# Patient Record
Sex: Female | Born: 1950
Health system: Southern US, Community
[De-identification: ages and names within clinical notes are randomized; demographics above are authoritative.]

## PROBLEM LIST (undated history)

## (undated) DIAGNOSIS — M1990 Unspecified osteoarthritis, unspecified site: Secondary | ICD-10-CM

## (undated) DIAGNOSIS — R011 Cardiac murmur, unspecified: Secondary | ICD-10-CM

## (undated) DIAGNOSIS — T7840XA Allergy, unspecified, initial encounter: Secondary | ICD-10-CM

## (undated) DIAGNOSIS — I7300 Raynaud's syndrome without gangrene: Secondary | ICD-10-CM

## (undated) DIAGNOSIS — Z8489 Family history of other specified conditions: Secondary | ICD-10-CM

## (undated) DIAGNOSIS — R51 Headache: Secondary | ICD-10-CM

## (undated) DIAGNOSIS — K7689 Other specified diseases of liver: Secondary | ICD-10-CM

## (undated) DIAGNOSIS — I071 Rheumatic tricuspid insufficiency: Secondary | ICD-10-CM

## (undated) DIAGNOSIS — F1910 Other psychoactive substance abuse, uncomplicated: Secondary | ICD-10-CM

## (undated) DIAGNOSIS — R519 Headache, unspecified: Secondary | ICD-10-CM

## (undated) DIAGNOSIS — Z8709 Personal history of other diseases of the respiratory system: Secondary | ICD-10-CM

## (undated) DIAGNOSIS — D689 Coagulation defect, unspecified: Secondary | ICD-10-CM

## (undated) DIAGNOSIS — K589 Irritable bowel syndrome without diarrhea: Secondary | ICD-10-CM

## (undated) DIAGNOSIS — Z972 Presence of dental prosthetic device (complete) (partial): Secondary | ICD-10-CM

## (undated) DIAGNOSIS — K219 Gastro-esophageal reflux disease without esophagitis: Secondary | ICD-10-CM

## (undated) DIAGNOSIS — B019 Varicella without complication: Secondary | ICD-10-CM

## (undated) DIAGNOSIS — I5189 Other ill-defined heart diseases: Secondary | ICD-10-CM

## (undated) DIAGNOSIS — J301 Allergic rhinitis due to pollen: Secondary | ICD-10-CM

## (undated) DIAGNOSIS — E039 Hypothyroidism, unspecified: Secondary | ICD-10-CM

## (undated) DIAGNOSIS — I10 Essential (primary) hypertension: Secondary | ICD-10-CM

## (undated) DIAGNOSIS — I340 Nonrheumatic mitral (valve) insufficiency: Secondary | ICD-10-CM

## (undated) DIAGNOSIS — M539 Dorsopathy, unspecified: Secondary | ICD-10-CM

## (undated) DIAGNOSIS — E119 Type 2 diabetes mellitus without complications: Secondary | ICD-10-CM

## (undated) DIAGNOSIS — D649 Anemia, unspecified: Secondary | ICD-10-CM

## (undated) HISTORY — PX: CERVICAL CERCLAGE: SHX1329

## (undated) HISTORY — PX: DIAGNOSTIC LAPAROSCOPY: SUR761

## (undated) HISTORY — DX: Headache, unspecified: R51.9

## (undated) HISTORY — DX: Coagulation defect, unspecified: D68.9

## (undated) HISTORY — DX: Cardiac murmur, unspecified: R01.1

## (undated) HISTORY — DX: Headache: R51

## (undated) HISTORY — DX: Unspecified osteoarthritis, unspecified site: M19.90

## (undated) HISTORY — DX: Essential (primary) hypertension: I10

## (undated) HISTORY — DX: Varicella without complication: B01.9

## (undated) HISTORY — DX: Allergy, unspecified, initial encounter: T78.40XA

## (undated) HISTORY — DX: Gastro-esophageal reflux disease without esophagitis: K21.9

## (undated) HISTORY — DX: Personal history of other diseases of the respiratory system: Z87.09

## (undated) HISTORY — DX: Other psychoactive substance abuse, uncomplicated: F19.10

## (undated) HISTORY — DX: Allergic rhinitis due to pollen: J30.1

## (undated) HISTORY — DX: Type 2 diabetes mellitus without complications: E11.9

## (undated) HISTORY — DX: Hypothyroidism, unspecified: E03.9

---
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
## 1972-07-28 HISTORY — PX: APPENDECTOMY: SHX54

## 2015-02-13 ENCOUNTER — Encounter: Payer: Self-pay | Admitting: Internal Medicine

## 2016-04-10 ENCOUNTER — Ambulatory Visit
Admission: RE | Admit: 2016-04-10 | Discharge: 2016-04-10 | Disposition: A | Discharge status: Home / Self Care | Payer: BLUE CROSS/BLUE SHIELD | Source: Ambulatory Visit | Attending: Orthopedic Surgery | Admitting: Orthopedic Surgery

## 2016-04-10 ENCOUNTER — Other Ambulatory Visit: Payer: Self-pay | Admitting: Orthopedic Surgery

## 2016-04-10 DIAGNOSIS — M47812 Spondylosis without myelopathy or radiculopathy, cervical region: Secondary | ICD-10-CM | POA: Insufficient documentation

## 2016-04-10 DIAGNOSIS — M4040 Postural lordosis, site unspecified: Secondary | ICD-10-CM | POA: Diagnosis not present

## 2016-04-10 DIAGNOSIS — R52 Pain, unspecified: Secondary | ICD-10-CM

## 2016-04-10 DIAGNOSIS — M542 Cervicalgia: Secondary | ICD-10-CM | POA: Diagnosis present

## 2016-05-28 LAB — HM DIABETES EYE EXAM

## 2016-08-07 DIAGNOSIS — E1165 Type 2 diabetes mellitus with hyperglycemia: Secondary | ICD-10-CM | POA: Diagnosis not present

## 2016-08-21 ENCOUNTER — Other Ambulatory Visit: Payer: Self-pay | Admitting: Orthopedic Surgery

## 2016-08-21 DIAGNOSIS — M542 Cervicalgia: Secondary | ICD-10-CM

## 2016-08-21 DIAGNOSIS — M5412 Radiculopathy, cervical region: Secondary | ICD-10-CM

## 2016-08-29 ENCOUNTER — Encounter: Payer: Self-pay | Admitting: Radiology

## 2016-08-29 ENCOUNTER — Ambulatory Visit
Admission: RE | Admit: 2016-08-29 | Discharge: 2016-08-29 | Disposition: A | Discharge status: Home / Self Care | Payer: Medicare Other | Source: Ambulatory Visit | Attending: Orthopedic Surgery | Admitting: Orthopedic Surgery

## 2016-08-29 DIAGNOSIS — M542 Cervicalgia: Secondary | ICD-10-CM

## 2016-08-29 DIAGNOSIS — M2548 Effusion, other site: Secondary | ICD-10-CM | POA: Insufficient documentation

## 2016-08-29 DIAGNOSIS — M5412 Radiculopathy, cervical region: Secondary | ICD-10-CM

## 2016-08-29 DIAGNOSIS — R609 Edema, unspecified: Secondary | ICD-10-CM | POA: Diagnosis not present

## 2016-08-29 DIAGNOSIS — M47892 Other spondylosis, cervical region: Secondary | ICD-10-CM | POA: Diagnosis not present

## 2016-08-29 DIAGNOSIS — M50321 Other cervical disc degeneration at C4-C5 level: Secondary | ICD-10-CM | POA: Insufficient documentation

## 2016-08-29 DIAGNOSIS — M47812 Spondylosis without myelopathy or radiculopathy, cervical region: Secondary | ICD-10-CM | POA: Diagnosis not present

## 2016-09-09 DIAGNOSIS — M50121 Cervical disc disorder at C4-C5 level with radiculopathy: Secondary | ICD-10-CM | POA: Diagnosis not present

## 2016-09-09 DIAGNOSIS — M50122 Cervical disc disorder at C5-C6 level with radiculopathy: Secondary | ICD-10-CM | POA: Diagnosis not present

## 2016-09-09 DIAGNOSIS — M50222 Other cervical disc displacement at C5-C6 level: Secondary | ICD-10-CM | POA: Diagnosis not present

## 2016-09-09 DIAGNOSIS — M50221 Other cervical disc displacement at C4-C5 level: Secondary | ICD-10-CM | POA: Diagnosis not present

## 2016-09-15 DIAGNOSIS — M50121 Cervical disc disorder at C4-C5 level with radiculopathy: Secondary | ICD-10-CM | POA: Diagnosis not present

## 2016-09-15 DIAGNOSIS — M50221 Other cervical disc displacement at C4-C5 level: Secondary | ICD-10-CM | POA: Diagnosis not present

## 2016-09-15 DIAGNOSIS — M50122 Cervical disc disorder at C5-C6 level with radiculopathy: Secondary | ICD-10-CM | POA: Diagnosis not present

## 2016-09-15 DIAGNOSIS — M50222 Other cervical disc displacement at C5-C6 level: Secondary | ICD-10-CM | POA: Diagnosis not present

## 2016-09-17 DIAGNOSIS — M50122 Cervical disc disorder at C5-C6 level with radiculopathy: Secondary | ICD-10-CM | POA: Diagnosis not present

## 2016-09-17 DIAGNOSIS — M50222 Other cervical disc displacement at C5-C6 level: Secondary | ICD-10-CM | POA: Diagnosis not present

## 2016-09-17 DIAGNOSIS — M50121 Cervical disc disorder at C4-C5 level with radiculopathy: Secondary | ICD-10-CM | POA: Diagnosis not present

## 2016-09-17 DIAGNOSIS — M50221 Other cervical disc displacement at C4-C5 level: Secondary | ICD-10-CM | POA: Diagnosis not present

## 2016-09-23 DIAGNOSIS — M50221 Other cervical disc displacement at C4-C5 level: Secondary | ICD-10-CM | POA: Diagnosis not present

## 2016-09-23 DIAGNOSIS — M50122 Cervical disc disorder at C5-C6 level with radiculopathy: Secondary | ICD-10-CM | POA: Diagnosis not present

## 2016-09-23 DIAGNOSIS — M50121 Cervical disc disorder at C4-C5 level with radiculopathy: Secondary | ICD-10-CM | POA: Diagnosis not present

## 2016-09-23 DIAGNOSIS — M50222 Other cervical disc displacement at C5-C6 level: Secondary | ICD-10-CM | POA: Diagnosis not present

## 2016-10-06 ENCOUNTER — Ambulatory Visit: Admitting: Family

## 2016-11-06 ENCOUNTER — Encounter: Payer: Self-pay | Admitting: Internal Medicine

## 2016-11-06 ENCOUNTER — Ambulatory Visit (INDEPENDENT_AMBULATORY_CARE_PROVIDER_SITE_OTHER): Payer: Medicare Other | Admitting: Internal Medicine

## 2016-11-06 VITALS — BP 122/74 | HR 75 | Temp 97.5°F | Resp 12 | Ht 63.78 in | Wt 121.4 lb

## 2016-11-06 DIAGNOSIS — Z1239 Encounter for other screening for malignant neoplasm of breast: Secondary | ICD-10-CM

## 2016-11-06 DIAGNOSIS — E039 Hypothyroidism, unspecified: Secondary | ICD-10-CM | POA: Diagnosis not present

## 2016-11-06 DIAGNOSIS — I10 Essential (primary) hypertension: Secondary | ICD-10-CM | POA: Diagnosis not present

## 2016-11-06 DIAGNOSIS — E1165 Type 2 diabetes mellitus with hyperglycemia: Secondary | ICD-10-CM | POA: Insufficient documentation

## 2016-11-06 DIAGNOSIS — Z1231 Encounter for screening mammogram for malignant neoplasm of breast: Secondary | ICD-10-CM

## 2016-11-06 DIAGNOSIS — Z1211 Encounter for screening for malignant neoplasm of colon: Secondary | ICD-10-CM

## 2016-11-06 DIAGNOSIS — E119 Type 2 diabetes mellitus without complications: Secondary | ICD-10-CM

## 2016-11-06 DIAGNOSIS — M5442 Lumbago with sciatica, left side: Secondary | ICD-10-CM | POA: Diagnosis not present

## 2016-11-06 LAB — CBC WITH DIFFERENTIAL/PLATELET
Basophils Absolute: 0.1 10*3/uL (ref 0.0–0.1)
Basophils Relative: 1 % (ref 0.0–3.0)
Eosinophils Absolute: 0.1 10*3/uL (ref 0.0–0.7)
Eosinophils Relative: 2.3 % (ref 0.0–5.0)
HCT: 39.3 % (ref 36.0–46.0)
Hemoglobin: 13.2 g/dL (ref 12.0–15.0)
Lymphocytes Relative: 28.4 % (ref 12.0–46.0)
Lymphs Abs: 1.5 10*3/uL (ref 0.7–4.0)
MCHC: 33.7 g/dL (ref 30.0–36.0)
MCV: 90.2 fl (ref 78.0–100.0)
Monocytes Absolute: 0.6 10*3/uL (ref 0.1–1.0)
Monocytes Relative: 11 % (ref 3.0–12.0)
Neutro Abs: 3 10*3/uL (ref 1.4–7.7)
Neutrophils Relative %: 57.3 % (ref 43.0–77.0)
Platelets: 252 10*3/uL (ref 150.0–400.0)
RBC: 4.36 Mil/uL (ref 3.87–5.11)
RDW: 13 % (ref 11.5–15.5)
WBC: 5.3 10*3/uL (ref 4.0–10.5)

## 2016-11-06 LAB — HEMOGLOBIN A1C: Hgb A1c MFr Bld: 6.5 % (ref 4.6–6.5)

## 2016-11-06 LAB — LIPID PANEL
Cholesterol: 130 mg/dL (ref 0–200)
HDL: 81.6 mg/dL (ref >39.00)
LDL Cholesterol: 34 mg/dL (ref 0–99)
NonHDL: 48.32
Total CHOL/HDL Ratio: 2
Triglycerides: 71 mg/dL (ref 0.0–149.0)
VLDL: 14.2 mg/dL (ref 0.0–40.0)

## 2016-11-06 LAB — COMPREHENSIVE METABOLIC PANEL
ALT: 16 U/L (ref 0–35)
AST: 24 U/L (ref 0–37)
Albumin: 4.9 g/dL (ref 3.5–5.2)
Alkaline Phosphatase: 50 U/L (ref 39–117)
BUN: 9 mg/dL (ref 6–23)
CO2: 28 mEq/L (ref 19–32)
Calcium: 10.1 mg/dL (ref 8.4–10.5)
Chloride: 101 mEq/L (ref 96–112)
Creatinine, Ser: 0.91 mg/dL (ref 0.40–1.20)
GFR: 65.87 mL/min (ref >60.00)
Glucose, Bld: 113 mg/dL — ABNORMAL HIGH (ref 70–99)
Potassium: 5.1 mEq/L (ref 3.5–5.1)
Sodium: 137 mEq/L (ref 135–145)
Total Bilirubin: 0.7 mg/dL (ref 0.2–1.2)
Total Protein: 7.1 g/dL (ref 6.0–8.3)

## 2016-11-06 LAB — TSH: TSH: 1.03 u[IU]/mL (ref 0.35–4.50)

## 2016-11-06 LAB — GLUCOSE, POCT (MANUAL RESULT ENTRY): POC Glucose: 80 mg/dl (ref 70–99)

## 2016-11-06 NOTE — Progress Notes (Signed)
Patient ID: Donna Howell, female   DOB: [birth year], 66 y.o.   MRN: [id number]   Subjective:    Patient ID: Donna Howell, female    DOB: Sep 02, 1950, 66 y.o.   MRN: [id number]  HPI  Patient here to establish care. Moved here from [place] 9 months ago.  Has a known history of diabetes.  Has been under good control.  Recent a1c 6.2.  Watches her diet.  Stays active.  No chest pain.  No sob.  No acid reflux problems reported.  Breathing stable.  Blood pressure has been doing well.  No abdominal pain.  Bowels moving.  Discussed her history.  Discussed screening colonoscopy.  She declines.  Discussed cologuard.  She does report some left hip pain and left leg pain.  Worse when gets out of bed.  Better when starts moving.  Has tried physical therapy, TENS andis on voltaren gel now.  Desires no further intervention at this time.  She had stress test previousy.  States blood pressure increased during stress test.  Was placed on inderal.  Did not tolerate.  Off medication since.  Has a history of migraines.  Headaches better.     Past Medical History:  Diagnosis Date  . Arthritis   . Chicken pox   . Diabetes ([place])   . Frequent headaches   . GERD (gastroesophageal reflux disease)   . Hay fever   . Heart murmur   . High blood pressure   . History of bronchitis   . Hypothyroidism    Past Surgical History:  Procedure Laterality Date  . APPENDECTOMY  1974  . TONSILLECTOMY AND ADENOIDECTOMY  1962   Family History  Problem Relation Age of Onset  . Arthritis Mother   . Heart disease Mother   . Diabetes Mother   . Stroke Father   . Arthritis Sister   . Alcoholism Brother   . Arthritis Maternal Grandmother   . Heart disease Maternal Grandmother   . Diabetes Maternal Grandmother    Social History   Social History  . Marital status: Married    Spouse name: N/A  . Number of children: N/A  . Years of education: N/A   Social History Main Topics  . Smoking status: Never Smoker  .  Smokeless tobacco: Never Used  . Alcohol use No  . Drug use: No  . Sexual activity: Not Asked   Other Topics Concern  . None   Social History Narrative  . None    Outpatient Encounter Prescriptions as of 11/06/2016  Medication Sig  . diclofenac sodium (VOLTAREN) 1 % GEL   . eszopiclone (LUNESTA) 2 MG TABS tablet Take 1 tablet by mouth at bedtime.  Marland Kitchen levothyroxine (SYNTHROID, LEVOTHROID) 50 MCG tablet Take 1 tablet by mouth daily.  Marland Kitchen lisinopril (PRINIVIL,ZESTRIL) 5 MG tablet Take 1 tablet by mouth daily.  . metFORMIN (GLUCOPHAGE-XR) 500 MG 24 hr tablet Take 1 tablet by mouth 2 (two) times daily.  . metoprolol succinate (TOPROL-XL) 25 MG 24 hr tablet Take 1 tablet by mouth daily. 1 tab in the morning and 1/2 at bedtime  . ONE TOUCH ULTRA TEST test strip   . rosuvastatin (CRESTOR) 20 MG tablet Take 1 tablet by mouth daily.   No facility-administered encounter medications on file as of 11/06/2016.     Review of Systems  Constitutional: Negative for appetite change and unexpected weight change.  HENT: Negative for congestion and sinus pressure.   Respiratory: Negative for cough, chest tightness and shortness of breath.  Cardiovascular: Negative for chest pain, palpitations and leg swelling.  Gastrointestinal: Negative for abdominal pain, diarrhea, nausea and vomiting.  Genitourinary: Negative for difficulty urinating and dysuria.  Musculoskeletal: Positive for back pain. Negative for joint swelling.  Skin: Negative for color change and rash.  Neurological: Negative for dizziness, light-headedness and headaches.  Psychiatric/Behavioral: Negative for agitation and dysphoric mood.       Objective:    Physical Exam  Constitutional: She appears well-developed and well-nourished. No distress.  HENT:  Nose: Nose normal.  Mouth/Throat: Oropharynx is clear and moist.  Neck: Neck supple. No thyromegaly present.  Cardiovascular: Normal rate and regular rhythm.   Pulmonary/Chest:  Breath sounds normal. No respiratory distress. She has no wheezes.  Abdominal: Soft. Bowel sounds are normal. There is no tenderness.  Musculoskeletal: She exhibits no edema or tenderness.  Lymphadenopathy:    She has no cervical adenopathy.  Skin: No rash noted. No erythema.  Psychiatric: She has a normal mood and affect. Her behavior is normal.    BP 122/74 (BP Location: Left Arm, Patient Position: Sitting, Cuff Size: Normal)   Pulse 75   Temp 97.5 F (36.4 C) (Oral)   Resp 12   Ht 5' 3.78" (1.62 m)   Wt 121 lb 6.4 oz (55.1 kg)   SpO2 96%   BMI 20.98 kg/m  Wt Readings from Last 3 Encounters:  11/06/16 121 lb 6.4 oz (55.1 kg)     Mr Cervical Spine Wo Contrast  Result Date: 08/29/2016 CLINICAL DATA:  Lifting injury 6 months ago. Headaches. Bilateral shoulder pain in upper arm pain. Bilateral hand numbness. EXAM: MRI CERVICAL SPINE WITHOUT CONTRAST TECHNIQUE: Multiplanar, multisequence MR imaging of the cervical spine was performed. No intravenous contrast was administered. COMPARISON:  04/10/2016 radiographs FINDINGS: Alignment: 2 mm degenerative anterolisthesis at C4-5. Vertebrae: Type 2 degenerative endplate findings at E1-7 with associated spurring, disc desiccation, and mild loss of disc height. Small chronic Schmorl's node along the superior endplate of C6. Left facet effusion with considerable facet marrow edema on the left at the C4-5 level. I do not perceive a fracture in this vicinity. Left eccentric type 1 degenerative endplate findings at E0-8. There is considerable abnormal edema in the spinous process of C7 as on image 8/4. There is a small amount of fluid separating the tip of the spinous process from the thickened nuchal ligament in this vicinity. Surrounding soft tissue edema tracking along the margins of the adjacent muscles inserting along the tip of the spinous process, including the longissimus capitis, semispinalis capitis, splenius capitis, and rhomboid major  bilaterally. Looking back at the conventional radiographs of 04/10/2016, I do not see an obvious bony fragment in this vicinity. Cord: No significant abnormal spinal cord signal is observed. Posterior Fossa, vertebral arteries, paraspinal tissues: Edema along the soft tissues near the C7 spinous process tip as detailed above. Disc levels: C2-3:  Unremarkable. C3-4: No impingement. Bilateral facet arthropathy and mild left uncinate spurring. C4-5: Mild left foraminal stenosis due to uncinate and facet spurring. Left facet joint effusion with marrow edema in the facets. Diffuse disc bulge. C5-6: Mild left foraminal stenosis due to uncinate and facet spurring. Mild disc bulge. C6-7:  No impingement.  Small central disc protrusion. C7-T1:  No impingement.  Right greater than left facet spurring. IMPRESSION: 1. Suspected avulsion of the nuchal ligament from the C7 spinous process tip, with marrow edema in the C7 spinous process, and a small amount of fluid interposed between the spinous process tip and the thickened  nuchal ligament. Low-level edema tracks along the adjacent muscular attachments to the C7 spinous process as detailed above. 2. Cervical spondylosis and degenerative disc disease causing mild impingement at C4-5 and C5-6. 3. Notable left facet effusion and facet marrow edema at the C4-5 level. Electronically Signed   By: Van Clines M.D.   On: 08/29/2016 09:16       Assessment & Plan:   Problem List Items Addressed This Visit    Back pain    Back and leg pain as outlined.  Had MRI.  Has tried therapy, TENS unit and traction.  Using voltaren gel now.  Does help some. Desires no further intervention at this time.  Follow.        Colon cancer screening    Declines colonoscopy.  Discussed cologuard.  Has kit from her previous MD.  See if can change to me as ordering physician.        Diabetes mellitus without complication ([facility])    Diet and exercise.  Last a1c 6.2.  Follow met b and a1c.   Keep up to date with eye checks.        Relevant Medications   lisinopril (PRINIVIL,ZESTRIL) 5 MG tablet   rosuvastatin (CRESTOR) 20 MG tablet   metFORMIN (GLUCOPHAGE-XR) 500 MG 24 hr tablet   Other Relevant Orders   Lipid panel (Completed)   Hemoglobin A1c (Completed)   POCT Glucose (CBG) (Completed)   Hypertension, essential    Blood pressure under good control.  Continue same medication regimen.  Follow pressures.  Follow metabolic panel.        Relevant Medications   lisinopril (PRINIVIL,ZESTRIL) 5 MG tablet   rosuvastatin (CRESTOR) 20 MG tablet   metoprolol succinate (TOPROL-XL) 25 MG 24 hr tablet   Other Relevant Orders   CBC with Differential/Platelet (Completed)   Comprehensive metabolic panel (Completed)   Hypothyroidism    On thyroid replacement.  Follow tsh.        Relevant Medications   levothyroxine (SYNTHROID, LEVOTHROID) 50 MCG tablet   metoprolol succinate (TOPROL-XL) 25 MG 24 hr tablet   Other Relevant Orders   TSH (Completed)    Other Visit Diagnoses    Screening for breast cancer    -  Primary   Relevant Orders   MM DIGITAL SCREENING BILATERAL       Einar Pheasant, MD

## 2016-11-06 NOTE — Progress Notes (Signed)
Pre-visit discussion using our clinic review tool. No additional management support is needed unless otherwise documented below in the visit note.  

## 2016-11-07 ENCOUNTER — Encounter: Payer: Self-pay | Admitting: Internal Medicine

## 2016-11-09 ENCOUNTER — Encounter: Payer: Self-pay | Admitting: Internal Medicine

## 2016-11-09 DIAGNOSIS — Z1211 Encounter for screening for malignant neoplasm of colon: Secondary | ICD-10-CM | POA: Insufficient documentation

## 2016-11-09 DIAGNOSIS — M549 Dorsalgia, unspecified: Secondary | ICD-10-CM | POA: Insufficient documentation

## 2016-11-09 NOTE — Assessment & Plan Note (Signed)
Diet and exercise.  Last a1c 6.2.  Follow met b and a1c.  Keep up to date with eye checks.

## 2016-11-09 NOTE — Assessment & Plan Note (Signed)
On thyroid replacement.  Follow tsh.  

## 2016-11-09 NOTE — Assessment & Plan Note (Signed)
Back and leg pain as outlined.  Had MRI.  Has tried therapy, TENS unit and traction.  Using voltaren gel now.  Does help some. Desires no further intervention at this time.  Follow.

## 2016-11-09 NOTE — Assessment & Plan Note (Signed)
Blood pressure under good control.  Continue same medication regimen.  Follow pressures.  Follow metabolic panel.   

## 2016-11-09 NOTE — Assessment & Plan Note (Signed)
Declines colonoscopy.  Discussed cologuard.  Has kit from her previous MD.  See if can change to me as ordering physician.

## 2016-12-05 ENCOUNTER — Ambulatory Visit
Admission: RE | Admit: 2016-12-05 | Discharge: 2016-12-05 | Disposition: A | Discharge status: Home / Self Care | Payer: Medicare Other | Source: Ambulatory Visit | Attending: Internal Medicine | Admitting: Internal Medicine

## 2016-12-05 DIAGNOSIS — Z1231 Encounter for screening mammogram for malignant neoplasm of breast: Secondary | ICD-10-CM | POA: Insufficient documentation

## 2016-12-05 DIAGNOSIS — Z1239 Encounter for other screening for malignant neoplasm of breast: Secondary | ICD-10-CM

## 2016-12-05 LAB — HM MAMMOGRAPHY

## 2016-12-06 ENCOUNTER — Encounter: Payer: Self-pay | Admitting: Internal Medicine

## 2016-12-06 DIAGNOSIS — Z0000 Encounter for general adult medical examination without abnormal findings: Secondary | ICD-10-CM | POA: Insufficient documentation

## 2017-02-02 ENCOUNTER — Other Ambulatory Visit: Payer: Self-pay | Admitting: Internal Medicine

## 2017-02-02 NOTE — Telephone Encounter (Signed)
I have written to ok the prescriptions, but need to clarify how she is taking the metoprolol.  Directions say 1 q day and 1 1/2 q day.  Just need to correct and then ok rx's as written.

## 2017-02-02 NOTE — Telephone Encounter (Signed)
Pt led requesting a refill on her levothyroxine (SYNTHROID, LEVOTHROID) 50 MCG tablet, metoprolol succinate (TOPROL-XL) 25 MG 24 hr tablet, rosuvastatin (CRESTOR) 20 MG tablet, eszopiclone (LUNESTA) 2 MG TABS tablet, and metFORMIN (GLUCOPHAGE-XR) 500 MG 24 hr tablet. Please advise, thank you!  [facility] [address] [place] - [id number]

## 2017-02-02 NOTE — Telephone Encounter (Signed)
You have never given any of the scripts. Last o/v with you on 11/06/16 with f/u on 02/25/17. She had all labs on 11/06/16. Can we refill?

## 2017-02-05 NOTE — Telephone Encounter (Signed)
Please call pt at [phone]

## 2017-02-06 ENCOUNTER — Telehealth: Payer: Self-pay

## 2017-02-06 ENCOUNTER — Other Ambulatory Visit: Payer: Self-pay

## 2017-02-06 MED ORDER — LEVOTHYROXINE SODIUM 50 MCG PO TABS: 50.0000 ug | ORAL_TABLET | Freq: Every day | ORAL | 2 refills | Status: DC

## 2017-02-06 MED ORDER — METOPROLOL SUCCINATE ER 25 MG PO TB24: ORAL_TABLET | ORAL | 2 refills | Status: DC

## 2017-02-06 MED ORDER — METFORMIN HCL ER 500 MG PO TB24: 500.0000 mg | ORAL_TABLET | Freq: Two times a day (BID) | ORAL | 2 refills | Status: DC

## 2017-02-06 MED ORDER — ROSUVASTATIN CALCIUM 20 MG PO TABS: 20.0000 mg | ORAL_TABLET | Freq: Every day | ORAL | 2 refills | Status: DC

## 2017-02-06 MED ORDER — ESZOPICLONE 2 MG PO TABS: 2.0000 mg | ORAL_TABLET | Freq: Every day | ORAL | 1 refills | Status: DC

## 2017-02-06 NOTE — Telephone Encounter (Signed)
lunesta faxed pt informed

## 2017-02-06 NOTE — Telephone Encounter (Signed)
ok'd rx for her prescriptions.

## 2017-02-06 NOTE — Telephone Encounter (Signed)
Patient states she is taking 1 tab in am and 1/2 at night.

## 2017-02-06 NOTE — Telephone Encounter (Signed)
Patient called states that she needs refills on following medications she has never received from you she is a new patient. She will run out before f/u with you Lunesta Synthroid  Metformin Metoprolol  Crestor

## 2017-02-06 NOTE — Telephone Encounter (Signed)
ok'd refill of medications.  rx for lunesta signed and placed in box.

## 2017-02-25 ENCOUNTER — Ambulatory Visit (INDEPENDENT_AMBULATORY_CARE_PROVIDER_SITE_OTHER): Payer: Medicare Other | Admitting: Internal Medicine

## 2017-02-25 ENCOUNTER — Encounter: Payer: Self-pay | Admitting: Internal Medicine

## 2017-02-25 VITALS — BP 108/60 | HR 62 | Temp 97.8°F | Resp 12 | Ht 64.0 in | Wt 124.6 lb

## 2017-02-25 DIAGNOSIS — M5442 Lumbago with sciatica, left side: Secondary | ICD-10-CM | POA: Diagnosis not present

## 2017-02-25 DIAGNOSIS — E039 Hypothyroidism, unspecified: Secondary | ICD-10-CM

## 2017-02-25 DIAGNOSIS — E2839 Other primary ovarian failure: Secondary | ICD-10-CM | POA: Diagnosis not present

## 2017-02-25 DIAGNOSIS — Z0000 Encounter for general adult medical examination without abnormal findings: Secondary | ICD-10-CM | POA: Diagnosis not present

## 2017-02-25 DIAGNOSIS — I10 Essential (primary) hypertension: Secondary | ICD-10-CM | POA: Diagnosis not present

## 2017-02-25 DIAGNOSIS — E119 Type 2 diabetes mellitus without complications: Secondary | ICD-10-CM | POA: Diagnosis not present

## 2017-02-25 LAB — BASIC METABOLIC PANEL
BUN: 9 mg/dL (ref 6–23)
CO2: 30 mEq/L (ref 19–32)
Calcium: 9.8 mg/dL (ref 8.4–10.5)
Chloride: 100 mEq/L (ref 96–112)
Creatinine, Ser: 0.91 mg/dL (ref 0.40–1.20)
GFR: 65.81 mL/min (ref >60.00)
Glucose, Bld: 113 mg/dL — ABNORMAL HIGH (ref 70–99)
Potassium: 5.3 mEq/L — ABNORMAL HIGH (ref 3.5–5.1)
Sodium: 135 mEq/L (ref 135–145)

## 2017-02-25 LAB — HEPATIC FUNCTION PANEL
ALT: 17 U/L (ref 0–35)
AST: 21 U/L (ref 0–37)
Albumin: 4.6 g/dL (ref 3.5–5.2)
Alkaline Phosphatase: 50 U/L (ref 39–117)
Bilirubin, Direct: 0.1 mg/dL (ref 0.0–0.3)
Total Bilirubin: 0.6 mg/dL (ref 0.2–1.2)
Total Protein: 6.9 g/dL (ref 6.0–8.3)

## 2017-02-25 LAB — LIPID PANEL
Cholesterol: 131 mg/dL (ref 0–200)
HDL: 80.3 mg/dL (ref >39.00)
LDL Cholesterol: 39 mg/dL (ref 0–99)
NonHDL: 50.72
Total CHOL/HDL Ratio: 2
Triglycerides: 61 mg/dL (ref 0.0–149.0)
VLDL: 12.2 mg/dL (ref 0.0–40.0)

## 2017-02-25 LAB — HM DIABETES FOOT EXAM

## 2017-02-25 LAB — HEMOGLOBIN A1C: Hgb A1c MFr Bld: 6.5 % (ref 4.6–6.5)

## 2017-02-25 LAB — TSH: TSH: 1.07 u[IU]/mL (ref 0.35–4.50)

## 2017-02-25 MED ORDER — LISINOPRIL 5 MG PO TABS: 5.0000 mg | ORAL_TABLET | Freq: Every day | ORAL | 3 refills | Status: DC

## 2017-02-25 MED ORDER — ONETOUCH ULTRA BLUE VI STRP: ORAL_STRIP | 12 refills | Status: DC

## 2017-02-25 NOTE — Progress Notes (Signed)
Patient ID: Donna HansenDeborah Howell, female   DOB: Aug 03, 1950, 66 y.o.   MRN: [id number]   Subjective:    Patient ID: Donna Hanseneborah Howell, female    DOB: Aug 03, 1950, 66 y.o.   MRN: [id number]  HPI  Patient with past history of GERD, diabetes, hypertension and hypothyroidism.  She comes in today to follow up on these issues as well as for a complete physical exam.  She reports she has been doing relatively well.  Stays active.  Exercises regularly.  Walks 7-10 miles per day.  Has noticed some intermittent pain right side, left shoulder, right hip and right leg.  Intermittent flares and stiffness.  Does not limit her exercise.  Tries to watch what she eats.  States blood sugars averaging 90-120s.  No chest pain. No sob.  No acid reflux.  No abdominal pain.  Bowels moving.     Past Medical History:  Diagnosis Date  . Arthritis   . Chicken pox   . Diabetes ([facility])   . Frequent headaches   . GERD (gastroesophageal reflux disease)   . Hay fever   . Heart murmur   . High blood pressure   . History of bronchitis   . Hypothyroidism    Past Surgical History:  Procedure Laterality Date  . APPENDECTOMY  1974  . TONSILLECTOMY AND ADENOIDECTOMY  1962   Family History  Problem Relation Age of Onset  . Arthritis Mother   . Heart disease Mother   . Diabetes Mother   . Stroke Father   . Arthritis Sister   . Breast cancer Sister [id number]  . Alcoholism Brother   . Arthritis Maternal Grandmother   . Heart disease Maternal Grandmother   . Diabetes Maternal Grandmother    Social History   Social History  . Marital status: Married    Spouse name: N/A  . Number of children: N/A  . Years of education: N/A   Social History Main Topics  . Smoking status: Never Smoker  . Smokeless tobacco: Never Used  . Alcohol use No  . Drug use: No  . Sexual activity: Not Asked   Other Topics Concern  . None   Social History Narrative  . None    Outpatient Encounter Prescriptions as of 02/25/2017  Medication Sig  .  eszopiclone (LUNESTA) 2 MG TABS tablet Take 1 tablet (2 mg total) by mouth at bedtime.  Marland Kitchen. levothyroxine (SYNTHROID, LEVOTHROID) 50 MCG tablet Take 1 tablet (50 mcg total) by mouth daily.  Marland Kitchen. lisinopril (PRINIVIL,ZESTRIL) 5 MG tablet Take 1 tablet (5 mg total) by mouth daily.  . metFORMIN (GLUCOPHAGE-XR) 500 MG 24 hr tablet Take 1 tablet (500 mg total) by mouth 2 (two) times daily.  . metoprolol succinate (TOPROL-XL) 25 MG 24 hr tablet 1 tab in the morning and 1/2 at bedtime  . ONE TOUCH ULTRA TEST test strip Check blood sugar tid  . rosuvastatin (CRESTOR) 20 MG tablet Take 1 tablet (20 mg total) by mouth daily.  . [DISCONTINUED] lisinopril (PRINIVIL,ZESTRIL) 5 MG tablet Take 1 tablet by mouth daily.  . [DISCONTINUED] ONE TOUCH ULTRA TEST test strip   . [DISCONTINUED] diclofenac sodium (VOLTAREN) 1 % GEL    No facility-administered encounter medications on file as of 02/25/2017.     Review of Systems  Constitutional: Negative for appetite change and unexpected weight change.  HENT: Negative for congestion and sinus pressure.   Eyes: Negative for pain and visual disturbance.  Respiratory: Negative for cough, chest tightness and shortness of breath.  Cardiovascular: Negative for chest pain, palpitations and leg swelling.  Gastrointestinal: Negative for abdominal pain, diarrhea, nausea and vomiting.  Genitourinary: Negative for difficulty urinating and dysuria.  Musculoskeletal: Negative for joint swelling.       Right hip and leg pain as outlined.  Left shoulder and right side pain as outlined.   Skin: Negative for color change and rash.  Neurological: Negative for dizziness, light-headedness and headaches.  Hematological: Negative for adenopathy. Does not bruise/bleed easily.  Psychiatric/Behavioral: Negative for agitation and dysphoric mood.       Objective:     Blood pressure rechecked by me:  110/68  Physical Exam  Constitutional: She is oriented to person, place, and time. She  appears well-developed and well-nourished. No distress.  HENT:  Nose: Nose normal.  Mouth/Throat: Oropharynx is clear and moist.  Eyes: Right eye exhibits no discharge. Left eye exhibits no discharge. No scleral icterus.  Neck: Neck supple. No thyromegaly present.  Cardiovascular: Normal rate and regular rhythm.   Pulmonary/Chest: Breath sounds normal. No accessory muscle usage. No tachypnea. No respiratory distress. She has no decreased breath sounds. She has no wheezes. She has no rhonchi. Right breast exhibits no inverted nipple, no mass, no nipple discharge and no tenderness (no axillary adenopathy). Left breast exhibits no inverted nipple, no mass, no nipple discharge and no tenderness (no axilarry adenopathy).  Abdominal: Soft. Bowel sounds are normal. There is no tenderness.  Musculoskeletal: She exhibits no edema or tenderness.  Feet:  No lesions.  Intact to light touch and pin prick.  DP pulses palpable and equal bilaterally.   Lymphadenopathy:    She has no cervical adenopathy.  Neurological: She is alert and oriented to person, place, and time.  Skin: Skin is warm. No rash noted. No erythema.  Psychiatric: She has a normal mood and affect. Her behavior is normal.    BP 108/60 (BP Location: Right Arm, Patient Position: Sitting, Cuff Size: Normal)   Pulse 62   Temp 97.8 F (36.6 C) (Oral)   Resp 12   Ht 5\' 4"  (1.626 m)   Wt 124 lb 9.6 oz (56.5 kg)   SpO2 98%   BMI 21.39 kg/m  Wt Readings from Last 3 Encounters:  02/25/17 124 lb 9.6 oz (56.5 kg)  11/06/16 121 lb 6.4 oz (55.1 kg)     Lab Results  Component Value Date   WBC 5.3 11/06/2016   HGB 13.2 11/06/2016   HCT 39.3 11/06/2016   PLT 252.0 11/06/2016   GLUCOSE 113 (H) 02/25/2017   CHOL 131 02/25/2017   TRIG 61.0 02/25/2017   HDL 80.30 02/25/2017   LDLCALC 39 02/25/2017   ALT 17 02/25/2017   AST 21 02/25/2017   NA 135 02/25/2017   K 4.7 02/27/2017   CL 100 02/25/2017   CREATININE 0.91 02/25/2017   BUN 9  02/25/2017   CO2 30 02/25/2017   TSH 1.07 02/25/2017   HGBA1C 6.5 02/25/2017   MICROALBUR 1.2 02/27/2017    Mm Screening Breast Tomo Bilateral  Result Date: 12/05/2016 CLINICAL DATA:  Screening. EXAM: 2D DIGITAL SCREENING BILATERAL MAMMOGRAM WITH CAD AND ADJUNCT TOMO COMPARISON:  None. ACR Breast Density Category b: There are scattered areas of fibroglandular density. FINDINGS: There are no findings suspicious for malignancy. Images were processed with CAD. IMPRESSION: No mammographic evidence of malignancy. A result letter of this screening mammogram will be mailed directly to the patient. RECOMMENDATION: Screening mammogram in one year. (Code:SM-B-01Y) BI-RADS CATEGORY  1: Negative. Electronically Signed  By: Britta MccreedySusan  Turner M.D.   On: 12/05/2016 16:05       Assessment & Plan:   Problem List Items Addressed This Visit    Back pain    Has various pains as outlined.  Flares intermittently.  No significant pain on exam today.  Discussed further evaluation.  She declines.  Has had previous MRI.  Has tried therapy, TENS unit and traction.  Desires no further intervention at this time.  Follow.        Diabetes mellitus without complication (HCC)    She is exercising regularly.  Watches her diet.  Is concerned sugars may be a little higher than previous check.  Sugars as outlined.  Check metabolic panel.  Up to date with eye checks.        Relevant Medications   lisinopril (PRINIVIL,ZESTRIL) 5 MG tablet   Other Relevant Orders   Lipid panel (Completed)   Hepatic function panel (Completed)   Hemoglobin A1c (Completed)   Basic metabolic panel (Completed)   Healthcare maintenance    Physical today 02/25/17.  Mammogram 12/05/16.  Declines colonoscopy.  Agreed to cologuard.        Relevant Orders   Microalbumin / creatinine urine ratio (Completed)   Hypertension, essential    Blood pressure under good control.  Continue same medication regimen.  Follow pressures.  Follow metabolic panel.          Relevant Medications   lisinopril (PRINIVIL,ZESTRIL) 5 MG tablet   Hypothyroidism    On thyroid replacements.  Follow tsh.        Relevant Orders   TSH (Completed)    Other Visit Diagnoses    Estrogen deficiency    -  Primary   Relevant Orders   DG Bone Density       Dale DurhamSCOTT, Nevaeha Finerty, MD

## 2017-02-25 NOTE — Patient Instructions (Signed)
Decrease metoprolol to 1/2 tablet two times per day

## 2017-02-25 NOTE — Progress Notes (Signed)
Pre-visit discussion using our clinic review tool. No additional management support is needed unless otherwise documented below in the visit note.  

## 2017-02-25 NOTE — Assessment & Plan Note (Signed)
Physical today 02/25/17.  Mammogram 12/05/16.  Declines colonoscopy.  Agreed to cologuard.

## 2017-02-26 ENCOUNTER — Other Ambulatory Visit: Payer: Self-pay | Admitting: Internal Medicine

## 2017-02-26 ENCOUNTER — Encounter: Payer: Self-pay | Admitting: Internal Medicine

## 2017-02-26 DIAGNOSIS — E875 Hyperkalemia: Secondary | ICD-10-CM

## 2017-02-26 NOTE — Telephone Encounter (Signed)
Called pt and discussed potassium result and need to just repeat to confirm wnl.  She felt better after phone call.  Questions answered.

## 2017-02-26 NOTE — Progress Notes (Signed)
Order placed for f/u potassium check.  

## 2017-02-27 ENCOUNTER — Other Ambulatory Visit (INDEPENDENT_AMBULATORY_CARE_PROVIDER_SITE_OTHER): Payer: Medicare Other

## 2017-02-27 DIAGNOSIS — E875 Hyperkalemia: Secondary | ICD-10-CM

## 2017-02-27 DIAGNOSIS — Z0000 Encounter for general adult medical examination without abnormal findings: Secondary | ICD-10-CM

## 2017-02-27 LAB — MICROALBUMIN / CREATININE URINE RATIO
Creatinine,U: 95.6 mg/dL
Microalb Creat Ratio: 1.3 mg/g (ref 0.0–30.0)
Microalb, Ur: 1.2 mg/dL (ref 0.0–1.9)

## 2017-02-27 LAB — POTASSIUM: Potassium: 4.7 mEq/L (ref 3.5–5.1)

## 2017-02-28 ENCOUNTER — Encounter: Payer: Self-pay | Admitting: Internal Medicine

## 2017-02-28 NOTE — Assessment & Plan Note (Signed)
She is exercising regularly.  Watches her diet.  Is concerned sugars may be a little higher than previous check.  Sugars as outlined.  Check metabolic panel.  Up to date with eye checks.

## 2017-02-28 NOTE — Assessment & Plan Note (Signed)
On thyroid replacements.  Follow tsh.    

## 2017-02-28 NOTE — Assessment & Plan Note (Signed)
Has various pains as outlined.  Flares intermittently.  No significant pain on exam today.  Discussed further evaluation.  She declines.  Has had previous MRI.  Has tried therapy, TENS unit and traction.  Desires no further intervention at this time.  Follow.

## 2017-02-28 NOTE — Assessment & Plan Note (Signed)
Blood pressure under good control.  Continue same medication regimen.  Follow pressures.  Follow metabolic panel.   

## 2017-03-20 ENCOUNTER — Encounter: Payer: Self-pay | Admitting: Internal Medicine

## 2017-03-20 NOTE — Telephone Encounter (Signed)
Sent message back that you are out of the office till Tuesday, that I forwarded the information to you to review on your return. thanks

## 2017-03-23 ENCOUNTER — Encounter: Payer: Self-pay | Admitting: Internal Medicine

## 2017-03-25 ENCOUNTER — Other Ambulatory Visit: Payer: Self-pay

## 2017-03-25 MED ORDER — LISINOPRIL 5 MG PO TABS: 5.0000 mg | ORAL_TABLET | Freq: Every day | ORAL | 1 refills | Status: DC

## 2017-03-25 MED ORDER — ROSUVASTATIN CALCIUM 20 MG PO TABS: 20.0000 mg | ORAL_TABLET | Freq: Every day | ORAL | 1 refills | Status: DC

## 2017-03-25 MED ORDER — METFORMIN HCL ER 500 MG PO TB24: 500.0000 mg | ORAL_TABLET | Freq: Two times a day (BID) | ORAL | 1 refills | Status: DC

## 2017-03-26 ENCOUNTER — Other Ambulatory Visit: Payer: Self-pay | Admitting: Internal Medicine

## 2017-03-26 NOTE — Telephone Encounter (Signed)
Last OV 02/25/2017 Next OV 05/06/2017 Last refill 02/06/2017

## 2017-03-27 NOTE — Telephone Encounter (Signed)
Hard copy mailed  

## 2017-04-03 ENCOUNTER — Ambulatory Visit: Payer: Medicare Other | Admitting: Podiatry

## 2017-04-08 ENCOUNTER — Other Ambulatory Visit: Payer: Medicare Other

## 2017-04-22 ENCOUNTER — Encounter: Payer: Self-pay | Admitting: Internal Medicine

## 2017-04-24 ENCOUNTER — Other Ambulatory Visit: Payer: Self-pay | Admitting: Internal Medicine

## 2017-05-06 ENCOUNTER — Ambulatory Visit (INDEPENDENT_AMBULATORY_CARE_PROVIDER_SITE_OTHER): Payer: Medicare Other | Admitting: Internal Medicine

## 2017-05-06 ENCOUNTER — Encounter: Payer: Self-pay | Admitting: Internal Medicine

## 2017-05-06 DIAGNOSIS — I10 Essential (primary) hypertension: Secondary | ICD-10-CM

## 2017-05-06 DIAGNOSIS — I7300 Raynaud's syndrome without gangrene: Secondary | ICD-10-CM

## 2017-05-06 DIAGNOSIS — E119 Type 2 diabetes mellitus without complications: Secondary | ICD-10-CM

## 2017-05-06 DIAGNOSIS — E039 Hypothyroidism, unspecified: Secondary | ICD-10-CM | POA: Diagnosis not present

## 2017-05-06 DIAGNOSIS — R109 Unspecified abdominal pain: Secondary | ICD-10-CM

## 2017-05-06 DIAGNOSIS — Z1211 Encounter for screening for malignant neoplasm of colon: Secondary | ICD-10-CM | POA: Diagnosis not present

## 2017-05-06 MED ORDER — LEVOTHYROXINE SODIUM 50 MCG PO TABS: 50.0000 ug | ORAL_TABLET | Freq: Every day | ORAL | 1 refills | Status: DC

## 2017-05-06 MED ORDER — METFORMIN HCL ER 500 MG PO TB24: 500.0000 mg | ORAL_TABLET | Freq: Two times a day (BID) | ORAL | 1 refills | Status: DC

## 2017-05-06 MED ORDER — RAMIPRIL 2.5 MG PO CAPS: 2.5000 mg | ORAL_CAPSULE | Freq: Every day | ORAL | 1 refills | Status: DC

## 2017-05-06 MED ORDER — METOPROLOL SUCCINATE ER 25 MG PO TB24: ORAL_TABLET | ORAL | 1 refills | Status: DC

## 2017-05-06 MED ORDER — ROSUVASTATIN CALCIUM 20 MG PO TABS: 20.0000 mg | ORAL_TABLET | Freq: Every day | ORAL | 1 refills | Status: DC

## 2017-05-06 NOTE — Patient Instructions (Signed)
Stop lisinopril when you start altace 2.5mg  per day.  Follow pressures and send in readings.

## 2017-05-06 NOTE — Progress Notes (Signed)
Patient ID: Donna Howell, female   DOB: July 01, 1951, 66 y.o.   MRN: [id number]   Subjective:    Patient ID: Donna Howell, female    DOB: 1951-01-31, 66 y.o.   MRN: [id number]  HPI  Patient here for a scheduled follow up. She reports she notices right side pain that has been present for several years.  Notices at the end of the day.  Certain positions aggravate.  No associated symptoms.  No pain lying down.  If she straightens up - is better.  No nausea or vomiting.  No abdominal pain.  Bowels moving.  States was told was msk in origin.  Of note, five years ago, states was told had liver cyst.  Felt no further w/up warranted.  She declines further testing or w/up now.  She also reports she was previously on altace.  Was changed to lisinopril.  States she felt better on altace.  Prefers to change back to altace if stays on ACE inhibitor.  She also has a history of raynaud's .  Has increased trouble in the winter.  Discussed the possibility of starting amlodipine.  She is on a low dose of metoprolol.  We decreased her dose.  She is now on 1/2 69m tablet.  Feels she needs to go back up to 1/2 table bid, secondary to some occasional palpitations.  States was controlled on higher dose.  No chest pain.  No sob.  Stays active.  Exercises regularly.     Past Medical History:  Diagnosis Date  . Arthritis   . Chicken pox   . Diabetes ([place]   . Frequent headaches   . GERD (gastroesophageal reflux disease)   . Hay fever   . Heart murmur   . High blood pressure   . History of bronchitis   . Hypothyroidism    Past Surgical History:  Procedure Laterality Date  . APPENDECTOMY  1974  . TONSILLECTOMY AND ADENOIDECTOMY  1962   Family History  Problem Relation Age of Onset  . Arthritis Mother   . Heart disease Mother   . Diabetes Mother   . Stroke Father   . Arthritis Sister   . Breast cancer Sister 534 . Alcoholism Brother   . Arthritis Maternal Grandmother   . Heart disease Maternal  Grandmother   . Diabetes Maternal Grandmother    Social History   Social History  . Marital status: Married    Spouse name: N/A  . Number of children: N/A  . Years of education: N/A   Social History Main Topics  . Smoking status: Never Smoker  . Smokeless tobacco: Never Used  . Alcohol use No  . Drug use: No  . Sexual activity: Not Asked   Other Topics Concern  . None   Social History Narrative  . None    Outpatient Encounter Prescriptions as of 05/06/2017  Medication Sig  . eszopiclone (LUNESTA) 2 MG TABS tablet take 1 tablet by mouth at bedtime  . levothyroxine (SYNTHROID, LEVOTHROID) 50 MCG tablet Take 1 tablet (50 mcg total) by mouth daily.  .Marland Kitchenlisinopril (PRINIVIL,ZESTRIL) 5 MG tablet Take 1 tablet (5 mg total) by mouth daily.  . metFORMIN (GLUCOPHAGE-XR) 500 MG 24 hr tablet Take 1 tablet (500 mg total) by mouth 2 (two) times daily.  . metoprolol succinate (TOPROL-XL) 25 MG 24 hr tablet 1/2 tablet bid  . ONE TOUCH ULTRA TEST test strip Check blood sugar tid  . rosuvastatin (CRESTOR) 20 MG tablet Take 1 tablet (20 mg  total) by mouth daily.  . [DISCONTINUED] levothyroxine (SYNTHROID, LEVOTHROID) 50 MCG tablet Take 1 tablet (50 mcg total) by mouth daily.  . [DISCONTINUED] metFORMIN (GLUCOPHAGE-XR) 500 MG 24 hr tablet Take 1 tablet (500 mg total) by mouth 2 (two) times daily.  . [DISCONTINUED] metoprolol succinate (TOPROL-XL) 25 MG 24 hr tablet take 1 tablet by mouth every morning and 1/2 at bedtime  . [DISCONTINUED] rosuvastatin (CRESTOR) 20 MG tablet Take 1 tablet (20 mg total) by mouth daily.  . ramipril (ALTACE) 2.5 MG capsule Take 1 capsule (2.5 mg total) by mouth daily.   No facility-administered encounter medications on file as of 05/06/2017.     Review of Systems  Constitutional: Negative for appetite change and unexpected weight change.  HENT: Negative for congestion and sinus pressure.   Respiratory: Negative for cough, chest tightness and shortness of breath.    Cardiovascular: Negative for chest pain, palpitations and leg swelling.  Gastrointestinal: Negative for abdominal pain, diarrhea, nausea and vomiting.  Genitourinary: Negative for difficulty urinating and dysuria.  Musculoskeletal: Negative for joint swelling and myalgias.       Right side pain as outlined.    Skin: Negative for color change and rash.  Neurological: Negative for dizziness, light-headedness and headaches.  Psychiatric/Behavioral: Negative for agitation and dysphoric mood.       Objective:    Physical Exam  Constitutional: She appears well-developed and well-nourished. No distress.  HENT:  Nose: Nose normal.  Mouth/Throat: Oropharynx is clear and moist.  Neck: Neck supple. No thyromegaly present.  Cardiovascular: Normal rate and regular rhythm.   Pulmonary/Chest: Breath sounds normal. No respiratory distress. She has no wheezes.  Abdominal: Soft. Bowel sounds are normal. There is no tenderness.  Musculoskeletal: She exhibits no edema or tenderness.  Lymphadenopathy:    She has no cervical adenopathy.  Skin: No rash noted. No erythema.  Psychiatric: She has a normal mood and affect. Her behavior is normal.    BP 110/62 (BP Location: Left Arm, Patient Position: Sitting, Cuff Size: Normal)   Pulse (!) 59   Temp 97.6 F (36.4 C) (Oral)   Resp 12   Ht 5' 4"  (1.626 m)   Wt 122 lb 6.4 oz (55.5 kg)   BMI 21.01 kg/m  Wt Readings from Last 3 Encounters:  05/06/17 122 lb 6.4 oz (55.5 kg)  02/25/17 124 lb 9.6 oz (56.5 kg)  11/06/16 121 lb 6.4 oz (55.1 kg)     Lab Results  Component Value Date   WBC 5.3 11/06/2016   HGB 13.2 11/06/2016   HCT 39.3 11/06/2016   PLT 252.0 11/06/2016   GLUCOSE 113 (H) 02/25/2017   CHOL 131 02/25/2017   TRIG 61.0 02/25/2017   HDL 80.30 02/25/2017   LDLCALC 39 02/25/2017   ALT 17 02/25/2017   AST 21 02/25/2017   NA 135 02/25/2017   K 4.7 02/27/2017   CL 100 02/25/2017   CREATININE 0.91 02/25/2017   BUN 9 02/25/2017   CO2  30 02/25/2017   TSH 1.07 02/25/2017   HGBA1C 6.5 02/25/2017   MICROALBUR 1.2 02/27/2017    Mm Screening Breast Tomo Bilateral  Result Date: 12/05/2016 CLINICAL DATA:  Screening. EXAM: 2D DIGITAL SCREENING BILATERAL MAMMOGRAM WITH CAD AND ADJUNCT TOMO COMPARISON:  None. ACR Breast Density Category b: There are scattered areas of fibroglandular density. FINDINGS: There are no findings suspicious for malignancy. Images were processed with CAD. IMPRESSION: No mammographic evidence of malignancy. A result letter of this screening mammogram will be mailed directly  to the patient. RECOMMENDATION: Screening mammogram in one year. (Code:SM-B-01Y) BI-RADS CATEGORY  1: Negative. Electronically Signed   By: Curlene Dolphin M.D.   On: 12/05/2016 16:05       Assessment & Plan:   Problem List Items Addressed This Visit    Colon cancer screening    Has cologuard kit at home.  She will complete and turn in.        Diabetes mellitus without complication ([place])    Discussed last a1c. She watches her diet and exercises regularly.  Follow met b and a1c.        Relevant Medications   metFORMIN (GLUCOPHAGE-XR) 500 MG 24 hr tablet   rosuvastatin (CRESTOR) 20 MG tablet   ramipril (ALTACE) 2.5 MG capsule   Other Relevant Orders   Lipid panel   Hemoglobin A1c   Hepatic function panel   Basic metabolic panel   Hypertension, essential    Blood pressure as outlined.  Will change to low dose altace per pt request. Change metoprolol back to 1/2 tablet bid.  Follow pressures.  Get her back in soon to reassess.        Relevant Medications   metoprolol succinate (TOPROL-XL) 25 MG 24 hr tablet   rosuvastatin (CRESTOR) 20 MG tablet   ramipril (ALTACE) 2.5 MG capsule   Hypothyroidism    On thyroid replacement.  Follow tsh.        Relevant Medications   levothyroxine (SYNTHROID, LEVOTHROID) 50 MCG tablet   metoprolol succinate (TOPROL-XL) 25 MG 24 hr tablet   Raynaud's disease    Has a history of raynaud's.   Had questions about medication, especially with cold weather coming.  Discussed starting amlodipine.  Will hold on starting today.  Adjust her blood pressure medication as outlined.  She will call with update over the next several weeks.  If blood pressure allows, will add low dose amlodipine.        Relevant Medications   metoprolol succinate (TOPROL-XL) 25 MG 24 hr tablet   rosuvastatin (CRESTOR) 20 MG tablet   ramipril (ALTACE) 2.5 MG capsule   Side pain    Side pain as outlined.  No pain reproducible on exam.  She declines further w/up or evaluation.  Follow.            Einar Pheasant, MD

## 2017-05-09 ENCOUNTER — Encounter: Payer: Self-pay | Admitting: Internal Medicine

## 2017-05-09 DIAGNOSIS — R109 Unspecified abdominal pain: Secondary | ICD-10-CM | POA: Insufficient documentation

## 2017-05-09 DIAGNOSIS — I7300 Raynaud's syndrome without gangrene: Secondary | ICD-10-CM | POA: Insufficient documentation

## 2017-05-09 NOTE — Assessment & Plan Note (Signed)
Has cologuard kit at home.  She will complete and turn in.

## 2017-05-09 NOTE — Assessment & Plan Note (Signed)
On thyroid replacement.  Follow tsh.  

## 2017-05-09 NOTE — Assessment & Plan Note (Signed)
Discussed last a1c. She watches her diet and exercises regularly.  Follow met b and a1c.

## 2017-05-09 NOTE — Assessment & Plan Note (Signed)
Has a history of raynaud's.  Had questions about medication, especially with cold weather coming.  Discussed starting amlodipine.  Will hold on starting today.  Adjust her blood pressure medication as outlined.  She will call with update over the next several weeks.  If blood pressure allows, will add low dose amlodipine.

## 2017-05-09 NOTE — Assessment & Plan Note (Signed)
Side pain as outlined.  No pain reproducible on exam.  She declines further w/up or evaluation.  Follow.

## 2017-05-09 NOTE — Assessment & Plan Note (Signed)
Blood pressure as outlined.  Will change to low dose altace per pt request. Change metoprolol back to 1/2 tablet bid.  Follow pressures.  Get her back in soon to reassess.

## 2017-05-25 ENCOUNTER — Encounter: Payer: Self-pay | Admitting: Internal Medicine

## 2017-05-25 NOTE — Telephone Encounter (Signed)
See me about this.  It does not sound like she is going to stay here.  She would need to have an establish care appt.  See me to discuss.

## 2017-06-02 ENCOUNTER — Other Ambulatory Visit: Payer: Self-pay | Admitting: Internal Medicine

## 2017-06-02 NOTE — Telephone Encounter (Signed)
Last script 03/27/17 #30 1 rf Last o/v 05/06/17 F/u 08/04/17

## 2017-06-16 ENCOUNTER — Encounter: Payer: Self-pay | Admitting: Internal Medicine

## 2017-07-11 ENCOUNTER — Encounter: Payer: Self-pay | Admitting: Internal Medicine

## 2017-07-14 MED ORDER — RAMIPRIL 5 MG PO CAPS: 5.0000 mg | ORAL_CAPSULE | Freq: Every day | ORAL | 1 refills | Status: DC

## 2017-07-14 NOTE — Telephone Encounter (Signed)
[facility]  See her my chart message.

## 2017-07-29 ENCOUNTER — Other Ambulatory Visit: Payer: Self-pay | Admitting: Internal Medicine

## 2017-07-30 NOTE — Telephone Encounter (Signed)
rx ok'd for lunesta #30 with 1 refill.   

## 2017-07-30 NOTE — Telephone Encounter (Signed)
Okay to refill Lunesta?  LOV: 05/06/17 NOV: 08/04/17

## 2017-07-31 ENCOUNTER — Other Ambulatory Visit (INDEPENDENT_AMBULATORY_CARE_PROVIDER_SITE_OTHER): Payer: Medicare Other

## 2017-07-31 DIAGNOSIS — E119 Type 2 diabetes mellitus without complications: Secondary | ICD-10-CM | POA: Diagnosis not present

## 2017-07-31 LAB — LIPID PANEL
Cholesterol: 138 mg/dL (ref 0–200)
HDL: 89.7 mg/dL (ref >39.00)
LDL Cholesterol: 37 mg/dL (ref 0–99)
NonHDL: 47.9
Total CHOL/HDL Ratio: 2
Triglycerides: 53 mg/dL (ref 0.0–149.0)
VLDL: 10.6 mg/dL (ref 0.0–40.0)

## 2017-07-31 LAB — HEPATIC FUNCTION PANEL
ALT: 18 U/L (ref 0–35)
AST: 25 U/L (ref 0–37)
Albumin: 4.6 g/dL (ref 3.5–5.2)
Alkaline Phosphatase: 51 U/L (ref 39–117)
Bilirubin, Direct: 0.1 mg/dL (ref 0.0–0.3)
Total Bilirubin: 0.6 mg/dL (ref 0.2–1.2)
Total Protein: 6.9 g/dL (ref 6.0–8.3)

## 2017-07-31 LAB — BASIC METABOLIC PANEL
BUN: 9 mg/dL (ref 6–23)
CO2: 30 mEq/L (ref 19–32)
Calcium: 9.4 mg/dL (ref 8.4–10.5)
Chloride: 99 mEq/L (ref 96–112)
Creatinine, Ser: 0.92 mg/dL (ref 0.40–1.20)
GFR: 64.9 mL/min (ref >60.00)
Glucose, Bld: 109 mg/dL — ABNORMAL HIGH (ref 70–99)
Potassium: 4.7 mEq/L (ref 3.5–5.1)
Sodium: 135 mEq/L (ref 135–145)

## 2017-07-31 LAB — HEMOGLOBIN A1C: Hgb A1c MFr Bld: 6.6 % — ABNORMAL HIGH (ref 4.6–6.5)

## 2017-07-31 NOTE — Telephone Encounter (Signed)
Rx faxed to [facility]. 

## 2017-08-03 ENCOUNTER — Encounter: Payer: Self-pay | Admitting: Internal Medicine

## 2017-08-04 ENCOUNTER — Encounter: Payer: Self-pay | Admitting: Internal Medicine

## 2017-08-04 ENCOUNTER — Ambulatory Visit (INDEPENDENT_AMBULATORY_CARE_PROVIDER_SITE_OTHER): Payer: Medicare Other | Admitting: Internal Medicine

## 2017-08-04 DIAGNOSIS — I10 Essential (primary) hypertension: Secondary | ICD-10-CM | POA: Diagnosis not present

## 2017-08-04 DIAGNOSIS — E039 Hypothyroidism, unspecified: Secondary | ICD-10-CM

## 2017-08-04 DIAGNOSIS — E119 Type 2 diabetes mellitus without complications: Secondary | ICD-10-CM | POA: Diagnosis not present

## 2017-08-04 MED ORDER — ESZOPICLONE 2 MG PO TABS: 2.0000 mg | ORAL_TABLET | Freq: Every day | ORAL | 1 refills | Status: DC

## 2017-08-04 MED ORDER — RAMIPRIL 5 MG PO CAPS: 5.0000 mg | ORAL_CAPSULE | Freq: Every day | ORAL | 1 refills | Status: DC

## 2017-08-04 NOTE — Progress Notes (Signed)
Pre visit review using our clinic review tool, if applicable. No additional management support is needed unless otherwise documented below in the visit note. 

## 2017-08-04 NOTE — Progress Notes (Signed)
Patient ID: Everlynn Sagun, female   DOB: [birth year], 67 y.o.   MRN: [id number]   Subjective:    Patient ID: [name], female    DOB: 10/18/1950, 66 y.o.   MRN: [id number]  HPI  Patient here for a scheduled follow up.  She reports she is doing relatively well.  Stays active.  Is exercising.  No chest pain.  No sob.  No acid reflux.  No abdominal pain.  Bowels moving.  No urine change.  Discussed her labs.  She is concerned her a1c is 6.6.  Wanted to increase metformin.  I discussed holding on changing medication. She checks her sugars regularly.  Taking altace 5mg  daily now.  Blood pressures better.  No dizziness or light headedness.     Past Medical History:  Diagnosis Date  . Arthritis   . Chicken pox   . Diabetes ([facility])   . Frequent headaches   . GERD (gastroesophageal reflux disease)   . Hay fever   . Heart murmur   . High blood pressure   . History of bronchitis   . Hypothyroidism    Past Surgical History:  Procedure Laterality Date  . APPENDECTOMY  1974  . TONSILLECTOMY AND ADENOIDECTOMY  1962   Family History  Problem Relation Age of Onset  . Arthritis Mother   . Heart disease Mother   . Diabetes Mother   . Stroke Father   . Arthritis Sister   . Breast cancer Sister 20  . Alcoholism Brother   . Arthritis Maternal Grandmother   . Heart disease Maternal Grandmother   . Diabetes Maternal Grandmother    Social History   Socioeconomic History  . Marital status: Married    Spouse name: None  . Number of children: None  . Years of education: None  . Highest education level: None  Social Needs  . Financial resource strain: None  . Food insecurity - worry: None  . Food insecurity - inability: None  . Transportation needs - medical: None  . Transportation needs - non-medical: None  Occupational History  . None  Tobacco Use  . Smoking status: Never Smoker  . Smokeless tobacco: Never Used  Substance and Sexual Activity  . Alcohol use: No  . Drug use: No   . Sexual activity: None  Other Topics Concern  . None  Social History Narrative  . None    Outpatient Encounter Medications as of 08/04/2017  Medication Sig  . eszopiclone (LUNESTA) 2 MG TABS tablet Take 1 tablet (2 mg total) by mouth at bedtime. Take immediately before bedtime  . levothyroxine (SYNTHROID, LEVOTHROID) 50 MCG tablet Take 1 tablet (50 mcg total) by mouth daily.  . metFORMIN (GLUCOPHAGE-XR) 500 MG 24 hr tablet Take 1 tablet (500 mg total) by mouth 2 (two) times daily.  . metoprolol succinate (TOPROL-XL) 25 MG 24 hr tablet 1/2 tablet bid  . ONE TOUCH ULTRA TEST test strip Check blood sugar tid  . ramipril (ALTACE) 5 MG capsule Take 1 capsule (5 mg total) by mouth daily.  . rosuvastatin (CRESTOR) 20 MG tablet Take 1 tablet (20 mg total) by mouth daily.  . [DISCONTINUED] eszopiclone (LUNESTA) 2 MG TABS tablet take 1 tablet by mouth at bedtime  . [DISCONTINUED] lisinopril (PRINIVIL,ZESTRIL) 5 MG tablet Take 1 tablet (5 mg total) by mouth daily.  . [DISCONTINUED] ramipril (ALTACE) 5 MG capsule Take 1 capsule (5 mg total) by mouth daily.   No facility-administered encounter medications on file as of 08/04/2017.  Review of Systems  Constitutional: Negative for appetite change and unexpected weight change.  HENT: Negative for congestion and sinus pressure.   Respiratory: Negative for cough, chest tightness and shortness of breath.   Cardiovascular: Negative for chest pain, palpitations and leg swelling.  Gastrointestinal: Negative for abdominal pain, diarrhea, nausea and vomiting.  Genitourinary: Negative for difficulty urinating and dysuria.  Musculoskeletal: Negative for back pain and joint swelling.  Skin: Negative for color change and rash.  Neurological: Negative for dizziness, light-headedness and headaches.  Psychiatric/Behavioral: Negative for agitation and dysphoric mood.       Objective:     Blood pressure rechecked by me:  124/72  Physical Exam    Constitutional: She appears well-developed and well-nourished. No distress.  HENT:  Nose: Nose normal.  Mouth/Throat: Oropharynx is clear and moist.  Neck: Neck supple. No thyromegaly present.  Cardiovascular: Normal rate and regular rhythm.  Pulmonary/Chest: Breath sounds normal. No respiratory distress. She has no wheezes.  Abdominal: Soft. Bowel sounds are normal. There is no tenderness.  Musculoskeletal: She exhibits no edema or tenderness.  Lymphadenopathy:    She has no cervical adenopathy.  Skin: No rash noted. No erythema.  Psychiatric: She has a normal mood and affect. Her behavior is normal.    BP 122/66   Pulse 61   Temp 98.3 F (36.8 C) (Oral)   Ht 5\' 4"  (1.626 m)   Wt 125 lb 12.8 oz (57.1 kg)   SpO2 98%   BMI 21.59 kg/m  Wt Readings from Last 3 Encounters:  08/04/17 125 lb 12.8 oz (57.1 kg)  05/06/17 122 lb 6.4 oz (55.5 kg)  02/25/17 124 lb 9.6 oz (56.5 kg)     Lab Results  Component Value Date   WBC 5.3 11/06/2016   HGB 13.2 11/06/2016   HCT 39.3 11/06/2016   PLT 252.0 11/06/2016   GLUCOSE 109 (H) 07/31/2017   CHOL 138 07/31/2017   TRIG 53.0 07/31/2017   HDL 89.70 07/31/2017   LDLCALC 37 07/31/2017   ALT 18 07/31/2017   AST 25 07/31/2017   NA 135 07/31/2017   K 4.7 07/31/2017   CL 99 07/31/2017   CREATININE 0.92 07/31/2017   BUN 9 07/31/2017   CO2 30 07/31/2017   TSH 1.07 02/25/2017   HGBA1C 6.6 (H) 07/31/2017   MICROALBUR 1.2 02/27/2017    Mm Screening Breast Tomo Bilateral  Result Date: 12/05/2016 CLINICAL DATA:  Screening. EXAM: 2D DIGITAL SCREENING BILATERAL MAMMOGRAM WITH CAD AND ADJUNCT TOMO COMPARISON:  None. ACR Breast Density Category b: There are scattered areas of fibroglandular density. FINDINGS: There are no findings suspicious for malignancy. Images were processed with CAD. IMPRESSION: No mammographic evidence of malignancy. A result letter of this screening mammogram will be mailed directly to the patient. RECOMMENDATION:  Screening mammogram in one year. (Code:SM-B-01Y) BI-RADS CATEGORY  1: Negative. Electronically Signed   By: Britta Mccreedy M.D.   On: 12/05/2016 16:05       Assessment & Plan:   Problem List Items Addressed This Visit    Diabetes mellitus without complication ([facility])    Discussed labs.  a1c 6.6.  Hold on making changes in her medication.  Follow sugars.        Relevant Medications   ramipril (ALTACE) 5 MG capsule   Other Relevant Orders   Hepatic function panel   Hemoglobin A1c   Lipid panel   Hypertension, essential    Blood pressure under good control.  Continue same medication regimen.  Follow pressures.  Follow metabolic panel.        Relevant Medications   ramipril (ALTACE) 5 MG capsule   Other Relevant Orders   CBC with Differential/Platelet   Basic metabolic panel   Hypothyroidism    On thyroid replacement.  Follow tsh.           Dale DurhamSCOTT, Mattias Walmsley, MD

## 2017-08-08 ENCOUNTER — Encounter: Payer: Self-pay | Admitting: Internal Medicine

## 2017-08-08 NOTE — Assessment & Plan Note (Signed)
On thyroid replacement.  Follow tsh.  

## 2017-08-08 NOTE — Assessment & Plan Note (Signed)
Discussed labs.  a1c 6.6.  Hold on making changes in her medication.  Follow sugars.

## 2017-08-08 NOTE — Assessment & Plan Note (Signed)
Blood pressure under good control.  Continue same medication regimen.  Follow pressures.  Follow metabolic panel.   

## 2017-09-08 ENCOUNTER — Encounter: Payer: Self-pay | Admitting: Internal Medicine

## 2017-09-08 ENCOUNTER — Ambulatory Visit
Admission: RE | Admit: 2017-09-08 | Discharge: 2017-09-08 | Disposition: A | Discharge status: Home / Self Care | Payer: Medicare Other | Source: Ambulatory Visit | Attending: Internal Medicine | Admitting: Internal Medicine

## 2017-09-08 DIAGNOSIS — M85851 Other specified disorders of bone density and structure, right thigh: Secondary | ICD-10-CM | POA: Diagnosis not present

## 2017-09-08 DIAGNOSIS — E2839 Other primary ovarian failure: Secondary | ICD-10-CM | POA: Diagnosis not present

## 2017-09-08 DIAGNOSIS — Z780 Asymptomatic menopausal state: Secondary | ICD-10-CM | POA: Diagnosis not present

## 2017-09-22 ENCOUNTER — Encounter: Payer: Self-pay | Admitting: Internal Medicine

## 2017-10-30 ENCOUNTER — Other Ambulatory Visit (INDEPENDENT_AMBULATORY_CARE_PROVIDER_SITE_OTHER): Payer: Medicare Other

## 2017-10-30 DIAGNOSIS — I10 Essential (primary) hypertension: Secondary | ICD-10-CM

## 2017-10-30 DIAGNOSIS — E119 Type 2 diabetes mellitus without complications: Secondary | ICD-10-CM | POA: Diagnosis not present

## 2017-10-30 LAB — HEPATIC FUNCTION PANEL
ALT: 17 U/L (ref 0–35)
AST: 25 U/L (ref 0–37)
Albumin: 4.5 g/dL (ref 3.5–5.2)
Alkaline Phosphatase: 53 U/L (ref 39–117)
Bilirubin, Direct: 0.1 mg/dL (ref 0.0–0.3)
Total Bilirubin: 0.6 mg/dL (ref 0.2–1.2)
Total Protein: 6.9 g/dL (ref 6.0–8.3)

## 2017-10-30 LAB — CBC WITH DIFFERENTIAL/PLATELET
Basophils Absolute: 0.1 10*3/uL (ref 0.0–0.1)
Basophils Relative: 1.1 % (ref 0.0–3.0)
Eosinophils Absolute: 0.2 10*3/uL (ref 0.0–0.7)
Eosinophils Relative: 4.5 % (ref 0.0–5.0)
HCT: 38.8 % (ref 36.0–46.0)
Hemoglobin: 13.4 g/dL (ref 12.0–15.0)
Lymphocytes Relative: 37.7 % (ref 12.0–46.0)
Lymphs Abs: 1.9 10*3/uL (ref 0.7–4.0)
MCHC: 34.7 g/dL (ref 30.0–36.0)
MCV: 89.3 fl (ref 78.0–100.0)
Monocytes Absolute: 0.6 10*3/uL (ref 0.1–1.0)
Monocytes Relative: 11.3 % (ref 3.0–12.0)
Neutro Abs: 2.2 10*3/uL (ref 1.4–7.7)
Neutrophils Relative %: 45.4 % (ref 43.0–77.0)
Platelets: 242 10*3/uL (ref 150.0–400.0)
RBC: 4.34 Mil/uL (ref 3.87–5.11)
RDW: 12.4 % (ref 11.5–15.5)
WBC: 5 10*3/uL (ref 4.0–10.5)

## 2017-10-30 LAB — HEMOGLOBIN A1C: Hgb A1c MFr Bld: 6.4 % (ref 4.6–6.5)

## 2017-10-30 LAB — BASIC METABOLIC PANEL
BUN: 11 mg/dL (ref 6–23)
CO2: 30 mEq/L (ref 19–32)
Calcium: 9.8 mg/dL (ref 8.4–10.5)
Chloride: 99 mEq/L (ref 96–112)
Creatinine, Ser: 0.89 mg/dL (ref 0.40–1.20)
GFR: 67.38 mL/min (ref >60.00)
Glucose, Bld: 99 mg/dL (ref 70–99)
Potassium: 4.9 mEq/L (ref 3.5–5.1)
Sodium: 135 mEq/L (ref 135–145)

## 2017-10-30 LAB — LIPID PANEL
Cholesterol: 141 mg/dL (ref 0–200)
HDL: 89.2 mg/dL (ref >39.00)
LDL Cholesterol: 41 mg/dL (ref 0–99)
NonHDL: 52.09
Total CHOL/HDL Ratio: 2
Triglycerides: 54 mg/dL (ref 0.0–149.0)
VLDL: 10.8 mg/dL (ref 0.0–40.0)

## 2017-11-02 ENCOUNTER — Encounter: Payer: Self-pay | Admitting: Internal Medicine

## 2017-11-03 ENCOUNTER — Encounter: Payer: Self-pay | Admitting: Internal Medicine

## 2017-11-03 ENCOUNTER — Ambulatory Visit (INDEPENDENT_AMBULATORY_CARE_PROVIDER_SITE_OTHER): Payer: Medicare Other

## 2017-11-03 ENCOUNTER — Ambulatory Visit (INDEPENDENT_AMBULATORY_CARE_PROVIDER_SITE_OTHER): Payer: Medicare Other | Admitting: Internal Medicine

## 2017-11-03 VITALS — BP 110/66 | HR 59 | Temp 97.8°F | Resp 16 | Wt 125.2 lb

## 2017-11-03 DIAGNOSIS — E039 Hypothyroidism, unspecified: Secondary | ICD-10-CM

## 2017-11-03 DIAGNOSIS — R0789 Other chest pain: Secondary | ICD-10-CM | POA: Diagnosis not present

## 2017-11-03 DIAGNOSIS — R079 Chest pain, unspecified: Secondary | ICD-10-CM | POA: Diagnosis not present

## 2017-11-03 DIAGNOSIS — I498 Other specified cardiac arrhythmias: Secondary | ICD-10-CM | POA: Diagnosis not present

## 2017-11-03 DIAGNOSIS — I10 Essential (primary) hypertension: Secondary | ICD-10-CM | POA: Diagnosis not present

## 2017-11-03 DIAGNOSIS — E119 Type 2 diabetes mellitus without complications: Secondary | ICD-10-CM

## 2017-11-03 NOTE — Progress Notes (Signed)
Patient ID: Donna Howell, female   DOB: Oct 04, 1950, 67 y.o.   MRN: [id number]   Subjective:    Patient ID: Donna Howell, female    DOB: Jan 08, 1951, 67 y.o.   MRN: [id number]  HPI  Patient here for a scheduled follow up.  States she is doing well.  Stays active.  Exercises regularly.  No chest pain.  No sob.  No acid reflux.  No abdominal pain.  Bowels moving.  She had questions about needing a f/u ECHO.  States she was told she needed an ECHO every few years.  Reviewed previous echo.  Discussed with her that echo was normal except for trace mitral and tricuspid regurgitation.  She does report noticing some increased heart fluttering.  May occur 2x/day.  Has increased recently.  No sob.  No acid reflux.  No abdominal pain.  Bowels moving.  She turned in her cologuard, but was contacted and informed needed to return again.     Past Medical History:  Diagnosis Date  . Arthritis   . Chicken pox   . Diabetes ([place])   . Frequent headaches   . GERD (gastroesophageal reflux disease)   . Hay fever   . Heart murmur   . High blood pressure   . History of bronchitis   . Hypothyroidism    Past Surgical History:  Procedure Laterality Date  . APPENDECTOMY  1974  . TONSILLECTOMY AND ADENOIDECTOMY  1962   Family History  Problem Relation Age of Onset  . Arthritis Mother   . Heart disease Mother   . Diabetes Mother   . Stroke Father   . Arthritis Sister   . Breast cancer Sister 8  . Alcoholism Brother   . Arthritis Maternal Grandmother   . Heart disease Maternal Grandmother   . Diabetes Maternal Grandmother    Social History   Socioeconomic History  . Marital status: Married    Spouse name: Not on file  . Number of children: Not on file  . Years of education: Not on file  . Highest education level: Not on file  Occupational History  . Not on file  Social Needs  . Financial resource strain: Not on file  . Food insecurity:    Worry: Not on file    Inability: Not on file  .  Transportation needs:    Medical: Not on file    Non-medical: Not on file  Tobacco Use  . Smoking status: Never Smoker  . Smokeless tobacco: Never Used  Substance and Sexual Activity  . Alcohol use: No  . Drug use: No  . Sexual activity: Not on file  Lifestyle  . Physical activity:    Days per week: Not on file    Minutes per session: Not on file  . Stress: Not on file  Relationships  . Social connections:    Talks on phone: Not on file    Gets together: Not on file    Attends religious service: Not on file    Active member of club or organization: Not on file    Attends meetings of clubs or organizations: Not on file    Relationship status: Not on file  Other Topics Concern  . Not on file  Social History Narrative  . Not on file    Outpatient Encounter Medications as of 11/03/2017  Medication Sig  . eszopiclone (LUNESTA) 2 MG TABS tablet Take 1 tablet (2 mg total) by mouth at bedtime. Take immediately before bedtime  . levothyroxine (SYNTHROID,  LEVOTHROID) 50 MCG tablet Take 1 tablet (50 mcg total) by mouth daily.  . metFORMIN (GLUCOPHAGE-XR) 500 MG 24 hr tablet Take 1 tablet (500 mg total) by mouth 2 (two) times daily.  . ONE TOUCH ULTRA TEST test strip Check blood sugar tid  . ramipril (ALTACE) 5 MG capsule Take 1 capsule (5 mg total) by mouth daily.  . rosuvastatin (CRESTOR) 20 MG tablet Take 1 tablet (20 mg total) by mouth daily.  . [DISCONTINUED] metoprolol succinate (TOPROL-XL) 25 MG 24 hr tablet 1/2 tablet bid   No facility-administered encounter medications on file as of 11/03/2017.    Outpatient Encounter Medications as of 11/03/2017  Medication Sig  . eszopiclone (LUNESTA) 2 MG TABS tablet Take 1 tablet (2 mg total) by mouth at bedtime. Take immediately before bedtime  . levothyroxine (SYNTHROID, LEVOTHROID) 50 MCG tablet Take 1 tablet (50 mcg total) by mouth daily.  . metFORMIN (GLUCOPHAGE-XR) 500 MG 24 hr tablet Take 1 tablet (500 mg total) by mouth 2 (two) times  daily.  . ONE TOUCH ULTRA TEST test strip Check blood sugar tid  . ramipril (ALTACE) 5 MG capsule Take 1 capsule (5 mg total) by mouth daily.  . rosuvastatin (CRESTOR) 20 MG tablet Take 1 tablet (20 mg total) by mouth daily.  . [DISCONTINUED] metoprolol succinate (TOPROL-XL) 25 MG 24 hr tablet 1/2 tablet bid   No facility-administered encounter medications on file as of 11/03/2017.     Review of Systems  Constitutional: Negative for appetite change and unexpected weight change.  HENT: Negative for congestion and sinus pressure.   Respiratory: Negative for cough, chest tightness and shortness of breath.   Cardiovascular: Negative for chest pain and leg swelling.       Notices persistent intermittent heart fluttering as outlined.  Noticed has increased recently.    Gastrointestinal: Negative for abdominal pain, diarrhea, nausea and vomiting.  Genitourinary: Negative for difficulty urinating and dysuria.  Musculoskeletal: Negative for joint swelling and myalgias.  Skin: Negative for color change and rash.  Neurological: Negative for dizziness, light-headedness and headaches.  Psychiatric/Behavioral: Negative for agitation and dysphoric mood.       Objective:    Physical Exam  Constitutional: She appears well-developed and well-nourished. No distress.  HENT:  Nose: Nose normal.  Mouth/Throat: Oropharynx is clear and moist.  Neck: Neck supple. No thyromegaly present.  Cardiovascular: Normal rate and regular rhythm.  Pulmonary/Chest: Breath sounds normal. No respiratory distress. She has no wheezes.  Abdominal: Soft. Bowel sounds are normal. There is no tenderness.  Musculoskeletal: She exhibits no edema or tenderness.  Lymphadenopathy:    She has no cervical adenopathy.  Skin: No rash noted. No erythema.  Psychiatric: She has a normal mood and affect. Her behavior is normal.    BP 110/66 (BP Location: Left Arm, Patient Position: Sitting, Cuff Size: Normal)   Pulse (!) 59   Temp  97.8 F (36.6 C) (Oral)   Resp 16   Wt 125 lb 4 oz (56.8 kg)   SpO2 97%   BMI 21.50 kg/m  Wt Readings from Last 3 Encounters:  11/03/17 125 lb 4 oz (56.8 kg)  08/04/17 125 lb 12.8 oz (57.1 kg)  05/06/17 122 lb 6.4 oz (55.5 kg)     Lab Results  Component Value Date   WBC 5.0 10/30/2017   HGB 13.4 10/30/2017   HCT 38.8 10/30/2017   PLT 242.0 10/30/2017   GLUCOSE 99 10/30/2017   CHOL 141 10/30/2017   TRIG 54.0 10/30/2017  HDL 89.20 10/30/2017   LDLCALC 41 10/30/2017   ALT 17 10/30/2017   AST 25 10/30/2017   NA 135 10/30/2017   K 4.9 10/30/2017   CL 99 10/30/2017   CREATININE 0.89 10/30/2017   BUN 11 10/30/2017   CO2 30 10/30/2017   TSH 1.07 02/25/2017   HGBA1C 6.4 10/30/2017   MICROALBUR 1.2 02/27/2017    Dg Bone Density  Result Date: 09/08/2017 EXAM: DUAL X-RAY ABSORPTIOMETRY (DXA) FOR BONE MINERAL DENSITY IMPRESSION: Dear Dr. Einar Pheasant, Your patient Contessa Preuss completed a BMD test on 09/08/2017 using the Ebony (analysis version: 14.10) manufactured by [employer]. The following summarizes the results of our evaluation. PATIENT BIOGRAPHICAL: Name: Lynia, Landry Patient ID: [id number] Birth Date: 11-12-1950 Height: 64.0 in. Gender: Female Exam Date: 09/08/2017 Weight: 125.8 lbs. Indications: Caucasian, Diabetic, Hypothyroid, Postmenopausal Fractures: Treatments: Levothyroxine, Multi-Vitamin, Vitamin D ASSESSMENT: The BMD measured at Femur Total Right is 0.883 g/cm2 with a T-score of -1.0. This patient is considered normal according to [facility]) criteria. L3 was excluded due to degenerative changes. Site Region Measured Measured WHO Young Adult BMD Date       Age      Classification T-score AP Spine L1-L4 (L3) 09/08/2017 66.1 Normal 0.0 1.182 g/cm2 DualFemur Total Right 09/08/2017 66.1 Normal -1.0 0.883 g/cm2 World Health Organization [facility]) criteria for post-menopausal, Caucasian Women: Normal:       T-score at or above -1 SD  Osteopenia:   T-score between -1 and -2.5 SD Osteoporosis: T-score at or below -2.5 SD RECOMMENDATIONS: [place] recommends that FDA-approved medical therapies be considered in postmenopausal women and men age 23 or older with a: 1. Hip or vertebral (clinical or morphometric) fracture. 2. T-score of < -2.5 at the spine or hip. 3. Ten-year fracture probability by FRAX of 3% or greater for hip fracture or 20% or greater for major osteoporotic fracture. All treatment decisions require clinical judgment and consideration of individual patient factors, including patient preferences, co-morbidities, previous drug use, risk factors not captured in the FRAX model (e.g. falls, vitamin D deficiency, increased bone turnover, interval significant decline in bone density) and possible under - or over-estimation of fracture risk by FRAX. All patients should ensure an adequate intake of dietary calcium (1200 mg/d) and vitamin D (800 IU daily) unless contraindicated. FOLLOW-UP: People with diagnosed cases of osteoporosis or at high risk for fracture should have regular bone mineral density tests. For patients eligible for Medicare, routine testing is allowed once every 2 years. The testing frequency can be increased to one year for patients who have rapidly progressing disease, those who are receiving or discontinuing medical therapy to restore bone mass, or have additional risk factors. I have reviewed this report, and agree with the above findings. [facility] Radiology Electronically Signed   By: Earle Gell M.D.   On: 09/08/2017 10:12       Assessment & Plan:   Problem List Items Addressed This Visit    Diabetes mellitus without complication ([place])    Discussed labs.  a1c 6.4.  Low carb diet and exercise.  Follow met b and a1c.        Relevant Orders   Lipid panel   Hemoglobin A1c   Fluttering heart    Persistent intermittent heart fluttering.  Increasing recently.  Desires f/u echo as  outlined.  Given persistent increased heart fluttering (occurring 2x/day now), will refer to cardiology for further evaluation.  On toprol.  Question of need for  holter.        Relevant Orders   TSH   Hypertension, essential    Blood pressure under good control.  Continue same medication regimen.  Follow pressures.  Follow metabolic panel.        Relevant Orders   Hepatic function panel   Basic metabolic panel   Hypothyroidism    On thyroid replacement.  Follow tsh.         Other Visit Diagnoses    Chest pain, unspecified type    -  Primary   Relevant Orders   DG Chest 2 View (Completed)       Einar Pheasant, MD

## 2017-11-04 ENCOUNTER — Encounter: Payer: Self-pay | Admitting: Internal Medicine

## 2017-11-04 ENCOUNTER — Telehealth: Payer: Self-pay | Admitting: *Deleted

## 2017-11-04 NOTE — Telephone Encounter (Signed)
Patient aware.

## 2017-11-04 NOTE — Telephone Encounter (Signed)
Copied from CRM [id number]. Topic: Quick Communication - Lab Results >> Nov 04, 2017  4:27 PM Darletta MollLander, Lumin L wrote: Notified patient regarding lab results chest x-ray that the cma will call her back to review them

## 2017-11-05 ENCOUNTER — Other Ambulatory Visit: Payer: Self-pay | Admitting: Internal Medicine

## 2017-11-07 ENCOUNTER — Encounter: Payer: Self-pay | Admitting: Internal Medicine

## 2017-11-07 DIAGNOSIS — I498 Other specified cardiac arrhythmias: Secondary | ICD-10-CM | POA: Insufficient documentation

## 2017-11-07 NOTE — Assessment & Plan Note (Signed)
Blood pressure under good control.  Continue same medication regimen.  Follow pressures.  Follow metabolic panel.   

## 2017-11-07 NOTE — Assessment & Plan Note (Signed)
Discussed labs.  a1c 6.4.  Low carb diet and exercise.  Follow met b and a1c.

## 2017-11-07 NOTE — Assessment & Plan Note (Signed)
On thyroid replacement.  Follow tsh.  

## 2017-11-07 NOTE — Assessment & Plan Note (Signed)
Persistent intermittent heart fluttering.  Increasing recently.  Desires f/u echo as outlined.  Given persistent increased heart fluttering (occurring 2x/day now), will refer to cardiology for further evaluation.  On toprol.  Question of need for holter.

## 2017-11-13 ENCOUNTER — Other Ambulatory Visit: Payer: Self-pay | Admitting: Internal Medicine

## 2017-11-20 ENCOUNTER — Telehealth: Payer: Self-pay | Admitting: Internal Medicine

## 2017-11-20 NOTE — Telephone Encounter (Signed)
Copied from CRM [id number]. Topic: Quick Communication - Rx Refill/Question >> Nov 20, 2017  4:30 PM Zada GirtLander, [place] L wrote: Medication: blood glucose monitor and supplies (lancets, strips, and she is not sure which one she qualifies for) Has the patient contacted their pharmacy? Yes.   (Agent: If no, request that the patient contact the pharmacy for the refill.) Preferred Pharmacy (with phone number or street name): [facility] [place] [address] [place] [id number] Phone: [phone] Fax: [phone] Agent: Please be advised that RX refills may take up to 3 business days. We ask that you follow-up with your pharmacy.  New to medicare so there has been changes with the cost. She says at the local pharmacy the script that was sent is costing $84. [facility]

## 2017-11-26 NOTE — Telephone Encounter (Signed)
LMTCB

## 2017-11-26 NOTE — Telephone Encounter (Signed)
Patient is going to call insurance company and see which meter is covered.

## 2017-12-15 ENCOUNTER — Other Ambulatory Visit: Payer: Self-pay | Admitting: Internal Medicine

## 2017-12-15 NOTE — Telephone Encounter (Signed)
Copied from CRM [id number]. Topic: Quick Communication - Rx Refill/Question >> Dec 15, 2017  1:14 PM Oneal Grout wrote: Medication: eszopiclone (LUNESTA) 2 MG TABS tablet  Has the patient contacted their pharmacy? Yes.  Pharmacy told her that provider denied, I do not see refill request (Agent: If no, request that the patient contact the pharmacy for the refill.) (Agent: If yes, when and what did the pharmacy advise?)  Preferred Pharmacy (with phone number or street name): [facility]  Agent: Please be advised that RX refills may take up to 3 business days. We ask that you follow-up with your pharmacy.

## 2017-12-16 NOTE — Telephone Encounter (Signed)
Lunesta 2 mg refill request  LOV 11/23/17 with Dr. Brigitte Pulse  Lst refill:  08/04/17   #30   1 refill  [place], [place] [address].

## 2017-12-16 NOTE — Telephone Encounter (Signed)
Please advise 

## 2017-12-17 ENCOUNTER — Other Ambulatory Visit: Payer: Self-pay | Admitting: Internal Medicine

## 2017-12-17 MED ORDER — ESZOPICLONE 2 MG PO TABS: 2.0000 mg | ORAL_TABLET | Freq: Every day | ORAL | 1 refills | Status: DC

## 2017-12-17 NOTE — Telephone Encounter (Signed)
Last OV 11/03/17 Next OV 03/12/18 Last refill 08/04/2017

## 2017-12-17 NOTE — Telephone Encounter (Signed)
Copied from CRM [id number]. Topic: Quick Communication - Rx Refill/Question >> Dec 15, 2017  1:14 PM Oneal Grout wrote: Medication: eszopiclone (LUNESTA) 2 MG TABS tablet  Has the patient contacted their pharmacy? Yes.  Pharmacy told her that provider denied, I do not see refill request (Agent: If no, request that the patient contact the pharmacy for the refill.) (Agent: If yes, when and what did the pharmacy advise?)  Preferred Pharmacy (with phone number or street name): [facility]  Agent: Please be advised that RX refills may take up to 3 business days. We ask that you follow-up with your pharmacy. >> Dec 17, 2017 10:08 AM Cipriano Bunker wrote: [facility].

## 2017-12-17 NOTE — Telephone Encounter (Signed)
Last OV 11/03/17 last fill on lunesta 1/19 ok to fill?

## 2017-12-18 ENCOUNTER — Telehealth: Payer: Self-pay | Admitting: Internal Medicine

## 2017-12-18 NOTE — Telephone Encounter (Signed)
Is this one we faxed.

## 2017-12-18 NOTE — Telephone Encounter (Signed)
No script was faxed yesterday, I refaxed.

## 2017-12-18 NOTE — Telephone Encounter (Signed)
Copied from CRM [id number]. Topic: Quick Communication - See Telephone Encounter >> Dec 18, 2017  4:07 PM Arlyss Gandy, NT wrote: CRM for notification. See Telephone encounter for: 12/18/17. Armandina Stammer with [facility] at [address] is requesting the office to call with a verbal order for the eszopiclone (LUNESTA) 2 MG TABS tablet.

## 2017-12-22 ENCOUNTER — Telehealth: Payer: Self-pay

## 2017-12-22 NOTE — Telephone Encounter (Signed)
Copied from CRM #[id number]. Topic: Quick Communication - See Telephone Encounter >> Dec 18, 2017  4:07 PM Richardson, Taren N, NT wrote: CRM for notification. See Telephone encounter for: 12/18/17. Takira with [facility] at [address] is requesting the office to call with a verbal order for the eszopiclone (LUNESTA) 2 MG TABS tablet.  >> Dec 18, 2017  5:08 PM Stovall, Shana A wrote: Per pharmacy, they feel like the script is being faxed over mulip times but for wrong name on script.    They would like office to check script that they are faxing and make sure the right name is on script  

## 2017-12-22 NOTE — Telephone Encounter (Signed)
rx has correct name on it. I have refaxed to [facility]

## 2017-12-22 NOTE — Telephone Encounter (Signed)
Copied from CRM [id number]. Topic: Quick Communication - See Telephone Encounter >> Dec 18, 2017  4:07 PM Arlyss Gandy, NT wrote: CRM for notification. See Telephone encounter for: 12/18/17. Donna Howell with [facility] at [address] is requesting the office to call with a verbal order for the eszopiclone (LUNESTA) 2 MG TABS tablet.  >> Dec 18, 2017  5:08 PM Floria Raveling A wrote: Per pharmacy, they feel like the script is being faxed over mulip times but for wrong name on script.    They would like office to check script that they are faxing and make sure the right name is on script

## 2017-12-23 NOTE — Telephone Encounter (Signed)
Called new script into pharmacy at [address]. Patient notified and is willing to switch pharmacies due to inconsistency at other location.

## 2018-01-04 ENCOUNTER — Encounter: Payer: Self-pay | Admitting: Internal Medicine

## 2018-01-05 NOTE — Telephone Encounter (Signed)
Please call and notify pt that if she is having increased pain from sciatica, she needs to be evaluated.  Also, regarding cologuard, per last note, she had returned cologuard and they told her she needed to do it again.  Is this correct?  If so, then does she want us to just reorder and start over.  If so, will need to complete form.

## 2018-01-05 NOTE — Telephone Encounter (Signed)
Per pt, feels is sciatica.  Stretches will help more than anything.  Also, I signed new form for reclast.  Given to you.

## 2018-01-05 NOTE — Telephone Encounter (Signed)
Patient would like to wait until August to be evaluated at appt but wondering if ice or heat would be beneficial? She does not want any medication.

## 2018-01-14 DIAGNOSIS — E089 Diabetes mellitus due to underlying condition without complications: Secondary | ICD-10-CM | POA: Diagnosis not present

## 2018-01-14 DIAGNOSIS — H04123 Dry eye syndrome of bilateral lacrimal glands: Secondary | ICD-10-CM | POA: Diagnosis not present

## 2018-01-14 DIAGNOSIS — H2513 Age-related nuclear cataract, bilateral: Secondary | ICD-10-CM | POA: Diagnosis not present

## 2018-01-14 DIAGNOSIS — H524 Presbyopia: Secondary | ICD-10-CM | POA: Diagnosis not present

## 2018-01-14 DIAGNOSIS — E119 Type 2 diabetes mellitus without complications: Secondary | ICD-10-CM | POA: Diagnosis not present

## 2018-01-18 ENCOUNTER — Telehealth: Payer: Self-pay | Admitting: Internal Medicine

## 2018-01-18 NOTE — Telephone Encounter (Signed)
Relation to pt: self Call back number: [phone] (M)   Reason for call:  Patient returned call and stated if she doesn't pick up due to construction at her home, please leave detail message

## 2018-01-18 NOTE — Telephone Encounter (Signed)
Left message with son for pt to call back re: refill; has refill available. Son is not on DPR.

## 2018-01-18 NOTE — Telephone Encounter (Signed)
Copied from CRM [phone]. Topic: Quick Communication - Rx Refill/Question >> Jan 18, 2018  9:32 AM Arlyss Gandyichardson, Niambi Smoak N, NT wrote: Medication: eszopiclone (LUNESTA) 2 MG TABS tablet  Has the patient contacted their pharmacy? Yes.   (Agent: If no, request that the patient contact the pharmacy for the refill.) (Agent: If yes, when and what did the pharmacy advise?)  Preferred Pharmacy (with phone number or street name): [id number] [place] - [address] [facility] [address] [phone] (Phone) [phone])      Agent: Please be advised that RX refills may take up to 3 business days. We ask that you follow-up with your pharmacy.

## 2018-01-18 NOTE — Telephone Encounter (Signed)
Left message to call back, cologuard order faxed

## 2018-01-22 ENCOUNTER — Telehealth: Payer: Self-pay

## 2018-01-22 NOTE — Telephone Encounter (Signed)
Disregard patient does not need refill

## 2018-01-22 NOTE — Telephone Encounter (Signed)
Copied from CRM [phone]. Topic: Quick Communication - Rx Refill/Question >> Jan 18, 2018  9:32 AM Arlyss Gandyichardson, Taren N, NT wrote: Medication: eszopiclone (LUNESTA) 2 MG TABS tablet  Has the patient contacted their pharmacy? Yes.   (Agent: If no, request that the patient contact the pharmacy for the refill.) (Agent: If yes, when and what did the pharmacy advise?)  Preferred Pharmacy (with phone number or street name): [id number] [place] - [address] [facility] [address] [phone] (Phone) [phone] (Fax)      Agent: Please be advised that RX refills may take up to 3 business days. We ask that you follow-up with your pharmacy. >> Jan 22, 2018 12:55 PM Percival SpanishKennedy, Cheryl W wrote:   Pt is following up on her req for the   eszopiclone (LUNESTA) 2 MG TABS tablet    She is out of the med   Please send to the pharmacy on file

## 2018-02-01 ENCOUNTER — Ambulatory Visit (INDEPENDENT_AMBULATORY_CARE_PROVIDER_SITE_OTHER): Payer: Medicare Other

## 2018-02-01 VITALS — BP 108/62 | HR 54 | Temp 97.6°F | Resp 12 | Ht 64.75 in | Wt 125.8 lb

## 2018-02-01 DIAGNOSIS — Z0000 Encounter for general adult medical examination without abnormal findings: Secondary | ICD-10-CM | POA: Diagnosis not present

## 2018-02-01 DIAGNOSIS — Z1159 Encounter for screening for other viral diseases: Secondary | ICD-10-CM | POA: Diagnosis not present

## 2018-02-01 NOTE — Progress Notes (Addendum)
Subjective:   Donna Howell is a 67 y.o. female who presents for an Initial Medicare Annual Wellness Visit.  Review of Systems    No ROS.  Medicare Wellness Visit. Additional risk factors are reflected in the social history.  Cardiac Risk Factors include: advanced age (>96mn, >>62women);hypertension;diabetes mellitus     Objective:    Today's Vitals   02/01/18 0938 02/01/18 0939  BP: 108/62   Pulse: (!) 54   Resp: 12   Temp: 97.6 F (36.4 C)   TempSrc: Oral   SpO2: 98%   Weight: 125 lb 12.8 oz (57.1 kg)   Height: 5' 4.75" (1.645 m)   PainSc:  4    Body mass index is 21.1 kg/m.  Advanced Directives 02/01/2018  Does Patient Have a Medical Advance Directive? Yes  Type of [place]  Does patient want to make changes to medical advance directive? No - Patient declined  Copy of HKathrynin Chart? No - copy requested    Current Medications (verified) Outpatient Encounter Medications as of 02/01/2018  Medication Sig  . eszopiclone (LUNESTA) 2 MG TABS tablet Take 1 tablet (2 mg total) by mouth at bedtime. Take immediately before bedtime  . metFORMIN (GLUCOPHAGE-XR) 500 MG 24 hr tablet TAKE 1 TABLET TWICE A DAY  . metoprolol succinate (TOPROL-XL) 25 MG 24 hr tablet TAKE ONE-HALF (1/2) TABLET TWICE A DAY  . ONE TOUCH ULTRA TEST test strip Check blood sugar tid  . ramipril (ALTACE) 5 MG capsule TAKE 1 CAPSULE DAILY  . rosuvastatin (CRESTOR) 20 MG tablet TAKE 1 TABLET DAILY  . SYNTHROID 50 MCG tablet TAKE 1 TABLET DAILY   No facility-administered encounter medications on file as of 02/01/2018.     Allergies (verified) Contrast media [iodinated diagnostic agents]; Penicillins; Percocet [oxycodone-acetaminophen]; and Percodan [oxycodone-aspirin]   History: Past Medical History:  Diagnosis Date  . Arthritis   . Chicken pox   . [place]   . Frequent headaches   . GERD (gastroesophageal reflux  disease)   . Hay fever   . Heart murmur   . High blood pressure   . History of bronchitis   . Hypothyroidism    Past Surgical History:  Procedure Laterality Date  . APPENDECTOMY  1974  . TONSILLECTOMY AND ADENOIDECTOMY  1962   Family History  Problem Relation Age of Onset  . Arthritis Mother   . Heart disease Mother   . Diabetes Mother   . Stroke Father   . Arthritis Sister   . Breast cancer Sister 573 . Alcoholism Brother   . Arthritis Maternal Grandmother   . Heart disease Maternal Grandmother   . Diabetes Maternal Grandmother    Social History   Socioeconomic History  . Marital status: Married    Spouse name: Not on file  . Number of children: Not on file  . Years of education: Not on file  . Highest education level: Not on file  Occupational History  . Not on file  Social Needs  . Financial resource strain: Not hard at all  . Food insecurity:    Worry: Never true    Inability: Never true  . Transportation needs:    Medical: No    Non-medical: No  Tobacco Use  . Smoking status: Never Smoker  . Smokeless tobacco: Never Used  Substance and Sexual Activity  . Alcohol use: No  . Drug use: No  . Sexual activity: Not on file  Lifestyle  . Physical activity:    Days per week: 7 days    Minutes per session: 90 min  . Stress: Not at all  Relationships  . Social connections:    Talks on phone: Not on file    Gets together: Not on file    Attends religious service: Not on file    Active member of club or organization: Not on file    Attends meetings of clubs or organizations: Not on file    Relationship status: Not on file  Other Topics Concern  . Not on file  Social History Narrative  . Not on file    Tobacco Counseling Counseling given: Not Answered   Clinical Intake:  Pre-visit preparation completed: Yes  Pain : 0-10 Pain Score: 4  Pain Location: Back Pain Radiating Towards: leg Pain Descriptors / Indicators: Discomfort Pain Onset: More  than a month ago(Chronic) Pain Frequency: Intermittent Effect of Pain on Daily Activities: self pace     Nutritional Status: BMI of 19-24  Normal Diabetes: Yes(Followed by pcp)  How often do you need to have someone help you when you read instructions, pamphlets, or other written materials from your doctor or pharmacy?: 1 - Never  Interpreter Needed?: No      Activities of Daily Living In your present state of health, do you have any difficulty performing the following activities: 02/01/2018  Hearing? N  Vision? N  Difficulty concentrating or making decisions? N  Walking or climbing stairs? N  Dressing or bathing? N  Doing errands, shopping? N  Preparing Food and eating ? N  Using the Toilet? N  In the past six months, have you accidently leaked urine? N  Do you have problems with loss of bowel control? N  Managing your Medications? N  Managing your Finances? N  Housekeeping or managing your Housekeeping? N  Some recent data might be hidden     Immunizations and Health Maintenance Immunization History  Administered Date(s) Administered  . Influenza Whole 05/04/2017  . Influenza,inj,Quad PF,6+ Mos 05/04/2017  . Pneumococcal Conjugate-13 08/01/2017   Health Maintenance Due  Topic Date Due  . Hepatitis C Screening  [birth year]  . TETANUS/TDAP  07/09/1970  . Fecal DNA (Cologuard)  07/09/2001    Patient Care Team: Einar Pheasant, MD as PCP - General (Internal Medicine)  Indicate any recent Medical Services you may have received from other than [facility] providers in the past year (date may be approximate).     Assessment:   This is a routine wellness examination for Donna Howell.  The goal of the wellness visit is to assist the patient how to close the gaps in care and create a preventative care plan for the patient.   The roster of all physicians providing medical care to patient is listed in the Snapshot section of the chart.  Osteoporosis risk reviewed.    Safety  issues reviewed; Smoke and carbon monoxide detectors in the home. No firearms in the home. Wears seatbelts when driving or riding with others. 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 No violence in the home.  They do not have excessive sun exposure.  Discussed the need for sun protection: hats, long sleeves and the use of sunscreen if there is significant sun exposure.  Patient is alert, normal appearance, oriented to person/place/and time. Correctly identified the president of the [place] and recalls of 3/3 words.Performs simple calculations and can read correct time from watch face. Displays appropriate judgement.  No new identified risk were noted.  No failures at ADL's or IADL's.    BMI- discussed the importance of a healthy diet, water intake and the benefits of aerobic exercise. Educational material provided.   24 hour diet recall: Low carb, lean meat diet  Dental- every 12 months. Partial dentures.   Eye- Visual acuity not assessed per patient preference since they have regular follow up with the ophthalmologist.  Wears corrective lenses.  Consent to order hep c screening; follow as directed.  Educational material provided.  TDAP vaccine deferred per patient preference.  Follow up with insurance.  Educational material provided.  Cologuard discussed; awaiting kit via mail.   Patient Concerns: None at this time. Follow up with PCP as needed.  Hearing/Vision screen Hearing Screening Comments: Patient is able to hear conversational tones without difficulty.  No issues reported.   Vision Screening Comments: Followed by [facility] Wears corrective lenses Last OV 01/2018 Diabetic Eye Exam; no retinopathy reported Visual acuity not assessed per patient preference since they have regular follow up with the ophthalmologist  Dietary issues and exercise activities discussed: Current Exercise Habits: Home exercise routine, Type of exercise: walking;treadmill,  Time (Minutes): > 60(90 minutes), Frequency (Times/Week): >7, Weekly Exercise (Minutes/Week): 0, Intensity: Intense  Goals    . DIET - INCREASE WATER INTAKE     Stay hydrated      Depression Screen PHQ 2/9 Scores 02/01/2018 08/04/2017 11/06/2016  PHQ - 2 Score 0 0 0    Fall Risk Fall Risk  02/01/2018 08/04/2017 11/06/2016  Falls in the past year? No No No   Cognitive Function: MMSE - Mini Mental State Exam 02/01/2018  Orientation to time 5  Orientation to Place 5  Registration 3  Attention/ Calculation 5  Recall 3  Language- name 2 objects 2  Language- repeat 1  Language- follow 3 step command 3  Language- read & follow direction 1  Write a sentence 1  Copy design 1  Total score 30        Screening Tests Health Maintenance  Topic Date Due  . Hepatitis C Screening  02/06/1951  . TETANUS/TDAP  07/09/1970  . Fecal DNA (Cologuard)  07/09/2001  . INFLUENZA VACCINE  02/25/2018  . FOOT EXAM  02/25/2018  . HEMOGLOBIN A1C  05/01/2018  . PNA vac Low Risk Adult (2 of 2 - PPSV23) 08/01/2018  . MAMMOGRAM  12/06/2018  . OPHTHALMOLOGY EXAM  01/19/2019  . DEXA SCAN  Completed     Plan:    End of life planning; Advance aging; Advanced directives discussed. Copy of current HCPOA/Living Will requested.    I have personally reviewed and noted the following in the patient's chart:   . Medical and social history . Use of alcohol, tobacco or illicit drugs  . Current medications and supplements . Functional ability and status . Nutritional status . Physical activity . Advanced directives . List of other physicians . Hospitalizations, surgeries, and ER visits in previous 12 months . Vitals . Screenings to include cognitive, depression, and falls . Referrals and appointments  In addition, I have reviewed and discussed with patient certain preventive protocols, quality metrics, and best practice recommendations. A written personalized care plan for preventive services as well as general  preventive health recommendations were provided to patient.     Varney Biles, LPN   02/28/3824    Reviewed above information.  Agree with assessment and plan.    Dr Nicki Reaper

## 2018-02-01 NOTE — Patient Instructions (Addendum)
  Ms. Donna Howell , Thank you for taking time to come for your Medicare Wellness Visit. I appreciate your ongoing commitment to your health goals. Please review the following plan we discussed and let me know if I can assist you in the future.   Follow up as needed.    Bring a copy of your Health Care Power of Attorney and/or Living Will to be scanned into chart.  Have a great day!  These are the goals we discussed: Goals    . DIET - INCREASE WATER INTAKE     Stay hydrated       This is a list of the screening recommended for you and due dates:  Health Maintenance  Topic Date Due  .  Hepatitis C: One time screening is recommended by Center for Disease Control  (CDC) for  adults born from [id number] through 1965.   [birth year]  . Tetanus Vaccine  07/09/1970  . Cologuard (Stool DNA test)  07/09/2001  . Flu Shot  02/25/2018  . Complete foot exam   02/25/2018  . Hemoglobin A1C  05/01/2018  . Pneumonia vaccines (2 of 2 - PPSV23) 08/01/2018  . Mammogram  12/06/2018  . Eye exam for diabetics  01/19/2019  . DEXA scan (bone density measurement)  Completed

## 2018-02-12 ENCOUNTER — Encounter: Payer: Self-pay | Admitting: Internal Medicine

## 2018-02-22 ENCOUNTER — Ambulatory Visit: Payer: Medicare Other | Admitting: Internal Medicine

## 2018-03-09 ENCOUNTER — Other Ambulatory Visit (INDEPENDENT_AMBULATORY_CARE_PROVIDER_SITE_OTHER): Payer: Medicare Other

## 2018-03-09 DIAGNOSIS — I498 Other specified cardiac arrhythmias: Secondary | ICD-10-CM

## 2018-03-09 DIAGNOSIS — Z1159 Encounter for screening for other viral diseases: Secondary | ICD-10-CM

## 2018-03-09 DIAGNOSIS — I10 Essential (primary) hypertension: Secondary | ICD-10-CM | POA: Diagnosis not present

## 2018-03-09 DIAGNOSIS — E119 Type 2 diabetes mellitus without complications: Secondary | ICD-10-CM

## 2018-03-09 LAB — HEPATIC FUNCTION PANEL
ALT: 18 U/L (ref 0–35)
AST: 24 U/L (ref 0–37)
Albumin: 4.6 g/dL (ref 3.5–5.2)
Alkaline Phosphatase: 51 U/L (ref 39–117)
Bilirubin, Direct: 0.1 mg/dL (ref 0.0–0.3)
Total Bilirubin: 0.6 mg/dL (ref 0.2–1.2)
Total Protein: 7 g/dL (ref 6.0–8.3)

## 2018-03-09 LAB — LIPID PANEL
Cholesterol: 141 mg/dL (ref 0–200)
HDL: 88.2 mg/dL (ref >39.00)
LDL Cholesterol: 42 mg/dL (ref 0–99)
NonHDL: 52.46
Total CHOL/HDL Ratio: 2
Triglycerides: 52 mg/dL (ref 0.0–149.0)
VLDL: 10.4 mg/dL (ref 0.0–40.0)

## 2018-03-09 LAB — TSH: TSH: 1.09 u[IU]/mL (ref 0.35–4.50)

## 2018-03-09 LAB — BASIC METABOLIC PANEL
BUN: 11 mg/dL (ref 6–23)
CO2: 30 mEq/L (ref 19–32)
Calcium: 9.8 mg/dL (ref 8.4–10.5)
Chloride: 100 mEq/L (ref 96–112)
Creatinine, Ser: 1 mg/dL (ref 0.40–1.20)
GFR: 58.84 mL/min — ABNORMAL LOW (ref >60.00)
Glucose, Bld: 109 mg/dL — ABNORMAL HIGH (ref 70–99)
Potassium: 4.3 mEq/L (ref 3.5–5.1)
Sodium: 135 mEq/L (ref 135–145)

## 2018-03-09 LAB — HEMOGLOBIN A1C: Hgb A1c MFr Bld: 6.6 % — ABNORMAL HIGH (ref 4.6–6.5)

## 2018-03-10 ENCOUNTER — Telehealth: Payer: Self-pay | Admitting: Internal Medicine

## 2018-03-10 ENCOUNTER — Encounter: Payer: Self-pay | Admitting: Internal Medicine

## 2018-03-10 LAB — HEPATITIS C ANTIBODY
Hepatitis C Ab: NONREACTIVE
SIGNAL TO CUT-OFF: 0.03 (ref <1.00)

## 2018-03-10 NOTE — Telephone Encounter (Signed)
The patient called with 3 concerns 1. She was told that she was not a patient of Dr. Bary Leriche and that her medication could not be filled. The pharmacist called the office to address that concern and her medication was filled and the patient was assured that she was Dr. Bary Leriche patient and she was not confused with another [name].  2. She had received 4 phone calls regarding scheduling her Medicare Wellness visit 2 were automated and 2 were from a person. I apologized for the numerous calls to get her to schedule her AWV. I did inform her that a couple of people were calling patients and automated calls had went out to get patients scheduled.  3.She stated that she had a cologuard test done and the test results were left by a recording to inform her that her test was contaminated and she needed to press an option to see if she wanted another test sent out. Per [facility] they mailed another test to her in July. The patient stated that she needed another test and she did not have the one that was mailed in July . The patient will be in the office on 8.16.19. I will inform her CMA to tell the patient to call [employer] to receive another kit.

## 2018-03-12 ENCOUNTER — Ambulatory Visit (INDEPENDENT_AMBULATORY_CARE_PROVIDER_SITE_OTHER): Payer: Medicare Other | Admitting: Internal Medicine

## 2018-03-12 ENCOUNTER — Encounter: Payer: Self-pay | Admitting: Internal Medicine

## 2018-03-12 VITALS — BP 110/72 | HR 55 | Temp 97.6°F | Ht 64.0 in | Wt 121.0 lb

## 2018-03-12 DIAGNOSIS — E119 Type 2 diabetes mellitus without complications: Secondary | ICD-10-CM

## 2018-03-12 DIAGNOSIS — R05 Cough: Secondary | ICD-10-CM

## 2018-03-12 DIAGNOSIS — Z0000 Encounter for general adult medical examination without abnormal findings: Secondary | ICD-10-CM

## 2018-03-12 DIAGNOSIS — R002 Palpitations: Secondary | ICD-10-CM

## 2018-03-12 DIAGNOSIS — R0602 Shortness of breath: Secondary | ICD-10-CM | POA: Diagnosis not present

## 2018-03-12 DIAGNOSIS — I10 Essential (primary) hypertension: Secondary | ICD-10-CM

## 2018-03-12 DIAGNOSIS — I498 Other specified cardiac arrhythmias: Secondary | ICD-10-CM | POA: Diagnosis not present

## 2018-03-12 DIAGNOSIS — R059 Cough, unspecified: Secondary | ICD-10-CM

## 2018-03-12 DIAGNOSIS — E039 Hypothyroidism, unspecified: Secondary | ICD-10-CM

## 2018-03-12 MED ORDER — ESZOPICLONE 2 MG PO TABS: 2.0000 mg | ORAL_TABLET | Freq: Every day | ORAL | 2 refills | Status: DC

## 2018-03-12 MED ORDER — LOSARTAN POTASSIUM 25 MG PO TABS: ORAL_TABLET | ORAL | 1 refills | Status: DC

## 2018-03-12 NOTE — Progress Notes (Signed)
Patient ID: Jahne Krukowski, female   DOB: May 30, 1951, 67 y.o.   MRN: [id number]   Subjective:    Patient ID: [name], female    DOB: Oct 05, 1950, 67 y.o.   MRN: [id number]  HPI  Patient with past history of diabetes and hypothyroidism,  She comes in today to follow up on these issues as well as for a complete physical exam.  She reports she is doing relatively well.  Increased stress.  She exercises to relieve stress.  Walks 8-10 miles per day.  No chest pain.  Breathing stable.  Some palpitations.  Stable.  Request echo.  See last note.  No acid reflux.  No abdominal pain.  Bowels moving.  Persistent cough.  She relates to altace.  No increased congestion.  No fever.  Eating.  No nausea or vomiting.     Past Medical History:  Diagnosis Date  . Arthritis   . Chicken pox   . Diabetes ([place])   . Frequent headaches   . GERD (gastroesophageal reflux disease)   . Hay fever   . Heart murmur   . High blood pressure   . History of bronchitis   . Hypothyroidism    Past Surgical History:  Procedure Laterality Date  . APPENDECTOMY  1974  . TONSILLECTOMY AND ADENOIDECTOMY  1962   Family History  Problem Relation Age of Onset  . Arthritis Mother   . Heart disease Mother   . Diabetes Mother   . Stroke Father   . Arthritis Sister   . Breast cancer Sister 38  . Alcoholism Brother   . Arthritis Maternal Grandmother   . Heart disease Maternal Grandmother   . Diabetes Maternal Grandmother    Social History   Socioeconomic History  . Marital status: Married    Spouse name: Not on file  . Number of children: Not on file  . Years of education: Not on file  . Highest education level: Not on file  Occupational History  . Not on file  Social Needs  . Financial resource strain: Not hard at all  . Food insecurity:    Worry: Never true    Inability: Never true  . Transportation needs:    Medical: No    Non-medical: No  Tobacco Use  . Smoking status: Never Smoker  . Smokeless  tobacco: Never Used  Substance and Sexual Activity  . Alcohol use: No  . Drug use: No  . Sexual activity: Not on file  Lifestyle  . Physical activity:    Days per week: 7 days    Minutes per session: 90 min  . Stress: Not at all  Relationships  . Social connections:    Talks on phone: Not on file    Gets together: Not on file    Attends religious service: Not on file    Active member of club or organization: Not on file    Attends meetings of clubs or organizations: Not on file    Relationship status: Not on file  Other Topics Concern  . Not on file  Social History Narrative  . Not on file    Outpatient Encounter Medications as of 03/12/2018  Medication Sig  . eszopiclone (LUNESTA) 2 MG TABS tablet Take 1 tablet (2 mg total) by mouth at bedtime. Take immediately before bedtime  . metFORMIN (GLUCOPHAGE-XR) 500 MG 24 hr tablet TAKE 1 TABLET TWICE A DAY  . metoprolol succinate (TOPROL-XL) 25 MG 24 hr tablet TAKE ONE-HALF (1/2) TABLET TWICE A DAY  .  ONE TOUCH ULTRA TEST test strip Check blood sugar tid  . rosuvastatin (CRESTOR) 20 MG tablet TAKE 1 TABLET DAILY  . SYNTHROID 50 MCG tablet TAKE 1 TABLET DAILY  . [DISCONTINUED] eszopiclone (LUNESTA) 2 MG TABS tablet Take 1 tablet (2 mg total) by mouth at bedtime. Take immediately before bedtime  . [DISCONTINUED] ramipril (ALTACE) 5 MG capsule TAKE 1 CAPSULE DAILY  . losartan (COZAAR) 25 MG tablet 1/2 - 1 tablet q day   No facility-administered encounter medications on file as of 03/12/2018.     Review of Systems  Constitutional: Negative for appetite change and unexpected weight change.  HENT: Negative for congestion and sinus pressure.   Eyes: Negative for pain and visual disturbance.  Respiratory: Negative for cough, chest tightness and shortness of breath.   Cardiovascular: Negative for chest pain, palpitations and leg swelling.  Gastrointestinal: Negative for abdominal pain, diarrhea, nausea and vomiting.  Genitourinary:  Negative for difficulty urinating and dysuria.  Musculoskeletal: Negative for joint swelling and myalgias.  Skin: Negative for color change and rash.  Neurological: Negative for dizziness, light-headedness and headaches.  Hematological: Negative for adenopathy. Does not bruise/bleed easily.  Psychiatric/Behavioral: Negative for agitation and dysphoric mood.       Objective:     Blood pressure rechecked by me:  118/68  Physical Exam  Constitutional: She is oriented to person, place, and time. She appears well-developed and well-nourished. No distress.  HENT:  Nose: Nose normal.  Mouth/Throat: Oropharynx is clear and moist.  Eyes: Right eye exhibits no discharge. Left eye exhibits no discharge. No scleral icterus.  Neck: Neck supple. No thyromegaly present.  Cardiovascular: Normal rate and regular rhythm.  Pulmonary/Chest: Breath sounds normal. No accessory muscle usage. No tachypnea. No respiratory distress. She has no decreased breath sounds. She has no wheezes. She has no rhonchi. Right breast exhibits no inverted nipple, no mass, no nipple discharge and no tenderness (no axillary adenopathy). Left breast exhibits no inverted nipple, no mass, no nipple discharge and no tenderness (no axilarry adenopathy).  Abdominal: Soft. Bowel sounds are normal. There is no tenderness.  Musculoskeletal: She exhibits no edema or tenderness.  Lymphadenopathy:    She has no cervical adenopathy.  Neurological: She is alert and oriented to person, place, and time.  Skin: No rash noted. No erythema.  Psychiatric: She has a normal mood and affect. Her behavior is normal.    BP 110/72   Pulse (!) 55   Temp 97.6 F (36.4 C) (Oral)   Ht 5' 4"  (1.626 m)   Wt 121 lb (54.9 kg)   SpO2 96%   BMI 20.77 kg/m  Wt Readings from Last 3 Encounters:  03/12/18 121 lb (54.9 kg)  02/01/18 125 lb 12.8 oz (57.1 kg)  11/03/17 125 lb 4 oz (56.8 kg)     Lab Results  Component Value Date   WBC 5.0 10/30/2017    HGB 13.4 10/30/2017   HCT 38.8 10/30/2017   PLT 242.0 10/30/2017   GLUCOSE 109 (H) 03/09/2018   CHOL 141 03/09/2018   TRIG 52.0 03/09/2018   HDL 88.20 03/09/2018   LDLCALC 42 03/09/2018   ALT 18 03/09/2018   AST 24 03/09/2018   NA 135 03/09/2018   K 4.3 03/09/2018   CL 100 03/09/2018   CREATININE 1.00 03/09/2018   BUN 11 03/09/2018   CO2 30 03/09/2018   TSH 1.09 03/09/2018   HGBA1C 6.6 (H) 03/09/2018   MICROALBUR 1.2 02/27/2017    Dg Bone Density  Result  Date: 09/08/2017 EXAM: DUAL X-RAY ABSORPTIOMETRY (DXA) FOR BONE MINERAL DENSITY IMPRESSION: Dear Dr. Einar Pheasant, Your patient Latoyia Tecson completed a BMD test on 09/08/2017 using the [place] (analysis version: 14.10) manufactured by [employer]. The following summarizes the results of our evaluation. PATIENT BIOGRAPHICAL: Name: Gwenith, Tschida Patient ID: [id number] Birth Date: [birth year] Height: 64.0 in. Gender: Female Exam Date: 09/08/2017 Weight: 125.8 lbs. Indications: Caucasian, Diabetic, Hypothyroid, Postmenopausal Fractures: Treatments: Levothyroxine, Multi-Vitamin, Vitamin D ASSESSMENT: The BMD measured at Femur Total Right is 0.883 g/cm2 with a T-score of -1.0. This patient is considered normal according to [facility]) criteria. L3 was excluded due to degenerative changes. Site Region Measured Measured WHO Young Adult BMD Date       Age      Classification T-score AP Spine L1-L4 (L3) 09/08/2017 66.1 Normal 0.0 1.182 g/cm2 DualFemur Total Right 09/08/2017 66.1 Normal -1.0 0.883 g/cm2 World Health Organization [facility]) criteria for post-menopausal, Caucasian Women: Normal:       T-score at or above -1 SD Osteopenia:   T-score between -1 and -2.5 SD Osteoporosis: T-score at or below -2.5 SD RECOMMENDATIONS: [place] recommends that FDA-approved medical therapies be considered in postmenopausal women and men age 32 or older with a: 1. Hip or vertebral (clinical or  morphometric) fracture. 2. T-score of < -2.5 at the spine or hip. 3. Ten-year fracture probability by FRAX of 3% or greater for hip fracture or 20% or greater for major osteoporotic fracture. All treatment decisions require clinical judgment and consideration of individual patient factors, including patient preferences, co-morbidities, previous drug use, risk factors not captured in the FRAX model (e.g. falls, vitamin D deficiency, increased bone turnover, interval significant decline in bone density) and possible under - or over-estimation of fracture risk by FRAX. All patients should ensure an adequate intake of dietary calcium (1200 mg/d) and vitamin D (800 IU daily) unless contraindicated. FOLLOW-UP: People with diagnosed cases of osteoporosis or at high risk for fracture should have regular bone mineral density tests. For patients eligible for Medicare, routine testing is allowed once every 2 years. The testing frequency can be increased to one year for patients who have rapidly progressing disease, those who are receiving or discontinuing medical therapy to restore bone mass, or have additional risk factors. I have reviewed this report, and agree with the above findings. [facility] Radiology Electronically Signed   By: Earle Gell M.D.   On: 09/08/2017 10:12       Assessment & Plan:   Problem List Items Addressed This Visit    Diabetes mellitus without complication ([place])    Discussed recent labs.  a1c 6.6.  She watches her diet.  Is exercising regularly.  Follow met b and a1c.       Relevant Medications   losartan (COZAAR) 25 MG tablet   Other Relevant Orders   Hemoglobin A1c   Hepatic function panel   Lipid panel   Basic metabolic panel   Microalbumin / creatinine urine ratio   Fluttering heart    Intermittent.  No change.  Desires f/u echo.  On toprol.        Relevant Orders   ECHOCARDIOGRAM COMPLETE   Healthcare maintenance    Physical today 03/12/18.  Mammogram 12/05/16 - Birads I.   cologuard reordered.        Hypertension, essential    Blood pressure as outlined.  Doing well.  Given cough, will stop altace.  Start losartan 29m 1/2 tablet per day.  Follow pressures.  Follow metabolic panel.        Relevant Medications   losartan (COZAAR) 25 MG tablet   Hypothyroidism    On thyroid replacement.  Follow tsh.         Other Visit Diagnoses    Cough    -  Primary   Possibly due to ACE inhibitor.  Will change to ARB as outlined.  Follow.  Notify me if persistent cough.     Palpitations       Relevant Orders   ECHOCARDIOGRAM COMPLETE   SOB (shortness of breath)       Reports some sob at times.  Able to exercise as outlined.  Request ECHO.    Relevant Orders   ECHOCARDIOGRAM COMPLETE       Einar Pheasant, MD

## 2018-03-16 ENCOUNTER — Encounter: Payer: Self-pay | Admitting: Internal Medicine

## 2018-03-16 DIAGNOSIS — M545 Low back pain: Secondary | ICD-10-CM

## 2018-03-16 NOTE — Assessment & Plan Note (Signed)
Physical today 03/12/18.  Mammogram 12/05/16 - Birads I.  cologuard reordered.

## 2018-03-16 NOTE — Assessment & Plan Note (Signed)
Discussed recent labs.  a1c 6.6.  She watches her diet.  Is exercising regularly.  Follow met b and a1c.

## 2018-03-16 NOTE — Assessment & Plan Note (Signed)
Intermittent.  No change.  Desires f/u echo.  On toprol.

## 2018-03-16 NOTE — Assessment & Plan Note (Signed)
Blood pressure as outlined.  Doing well.  Given cough, will stop altace.  Start losartan 25mg  1/2 tablet per day.  Follow pressures.  Follow metabolic panel.

## 2018-03-16 NOTE — Assessment & Plan Note (Signed)
On thyroid replacement.  Follow tsh.  

## 2018-03-17 ENCOUNTER — Other Ambulatory Visit: Payer: Self-pay | Admitting: Internal Medicine

## 2018-03-17 DIAGNOSIS — R0602 Shortness of breath: Secondary | ICD-10-CM

## 2018-03-18 NOTE — Telephone Encounter (Signed)
ECHO order has been placed.  They should be contacting her with appt date and time.  Regarding xray, she reported some persistent low back pain. (please confirm with pt exactly where having pain).  If she wants to get back xray, I can place order and she can come in to office for xray.  Just let me know what I need to order.

## 2018-03-18 NOTE — Telephone Encounter (Signed)
Patient says she is having low back pain on the right side into the hip area. This is not new. She is just wanting to see if there is any change. I advised that you were out of the office for the week and would return on Monday. I will let her know once the order has been placed so she can come in

## 2018-03-19 NOTE — Telephone Encounter (Signed)
Patient aware and will come in next week

## 2018-03-19 NOTE — Telephone Encounter (Signed)
I have placed the order for the xray.  She can come in at her convenience.

## 2018-03-22 ENCOUNTER — Ambulatory Visit (INDEPENDENT_AMBULATORY_CARE_PROVIDER_SITE_OTHER): Payer: Medicare Other

## 2018-03-22 DIAGNOSIS — M545 Low back pain: Secondary | ICD-10-CM | POA: Diagnosis not present

## 2018-03-22 DIAGNOSIS — M47816 Spondylosis without myelopathy or radiculopathy, lumbar region: Secondary | ICD-10-CM | POA: Diagnosis not present

## 2018-03-31 ENCOUNTER — Other Ambulatory Visit: Payer: Self-pay

## 2018-03-31 ENCOUNTER — Ambulatory Visit (INDEPENDENT_AMBULATORY_CARE_PROVIDER_SITE_OTHER): Payer: Medicare Other

## 2018-03-31 DIAGNOSIS — R0602 Shortness of breath: Secondary | ICD-10-CM

## 2018-04-01 ENCOUNTER — Encounter: Payer: Self-pay | Admitting: Internal Medicine

## 2018-04-05 ENCOUNTER — Other Ambulatory Visit: Payer: Self-pay | Admitting: Internal Medicine

## 2018-04-05 DIAGNOSIS — I071 Rheumatic tricuspid insufficiency: Secondary | ICD-10-CM

## 2018-04-05 DIAGNOSIS — I272 Pulmonary hypertension, unspecified: Secondary | ICD-10-CM

## 2018-04-05 NOTE — Progress Notes (Signed)
Order placed for cardiology referral.   

## 2018-04-07 ENCOUNTER — Encounter: Payer: Self-pay | Admitting: Internal Medicine

## 2018-04-07 DIAGNOSIS — I071 Rheumatic tricuspid insufficiency: Secondary | ICD-10-CM | POA: Insufficient documentation

## 2018-04-07 DIAGNOSIS — I5189 Other ill-defined heart diseases: Secondary | ICD-10-CM | POA: Diagnosis not present

## 2018-04-07 DIAGNOSIS — I498 Other specified cardiac arrhythmias: Secondary | ICD-10-CM | POA: Diagnosis not present

## 2018-04-07 DIAGNOSIS — R002 Palpitations: Secondary | ICD-10-CM | POA: Diagnosis not present

## 2018-04-07 DIAGNOSIS — E119 Type 2 diabetes mellitus without complications: Secondary | ICD-10-CM | POA: Diagnosis not present

## 2018-04-07 DIAGNOSIS — I10 Essential (primary) hypertension: Secondary | ICD-10-CM | POA: Diagnosis not present

## 2018-04-07 DIAGNOSIS — I429 Cardiomyopathy, unspecified: Secondary | ICD-10-CM | POA: Diagnosis not present

## 2018-04-08 ENCOUNTER — Other Ambulatory Visit: Payer: Self-pay

## 2018-04-08 MED ORDER — LOSARTAN POTASSIUM 25 MG PO TABS: ORAL_TABLET | ORAL | 1 refills | Status: DC

## 2018-04-17 ENCOUNTER — Other Ambulatory Visit: Payer: Self-pay | Admitting: Internal Medicine

## 2018-04-21 DIAGNOSIS — I429 Cardiomyopathy, unspecified: Secondary | ICD-10-CM | POA: Diagnosis not present

## 2018-05-06 DIAGNOSIS — E119 Type 2 diabetes mellitus without complications: Secondary | ICD-10-CM | POA: Diagnosis not present

## 2018-05-06 DIAGNOSIS — I10 Essential (primary) hypertension: Secondary | ICD-10-CM | POA: Diagnosis not present

## 2018-05-06 DIAGNOSIS — I498 Other specified cardiac arrhythmias: Secondary | ICD-10-CM | POA: Diagnosis not present

## 2018-05-06 DIAGNOSIS — I071 Rheumatic tricuspid insufficiency: Secondary | ICD-10-CM | POA: Diagnosis not present

## 2018-05-10 ENCOUNTER — Telehealth: Payer: Self-pay | Admitting: *Deleted

## 2018-05-10 NOTE — Telephone Encounter (Signed)
Copied from CRM [id number]. Topic: General - Other >> May 10, 2018 10:23 AM Percival Spanish wrote:  Pt call to say she has no ideal how her med was changed, she said she was taking 1000 mg 2 times a day that equal to 2000 mg a day and now she is taking 500mg  2  times a day and does not remember telling her it was going to change.   Pt would like a call back   metFORMIN (GLUCOPHAGE-XR) 500 MG 24 hr tablet

## 2018-05-10 NOTE — Telephone Encounter (Signed)
I do not see where she has been on 1000mg  since I have been seeing her.  Given a1c 6.4-6.6, I would like for her to continue on her current medication regimen and we will follow sugars.

## 2018-05-10 NOTE — Telephone Encounter (Signed)
I looked at last OV notes and do not see where this was ever 1000 mg daily but patient says she was taking 1000 mg twice per day of Metformin.

## 2018-05-11 NOTE — Telephone Encounter (Signed)
Patient was on 1000 mg twice a day from previous doctor, she says she is use to her A1C has always been around 6.2 she just did not like the A1c being 6.6. Patient will stay on 500 mg BID until next A1c in November if that what you would like her to do.

## 2018-05-11 NOTE — Telephone Encounter (Signed)
Yes.  We will follow.  Can increase if needed.

## 2018-05-12 ENCOUNTER — Other Ambulatory Visit: Payer: Self-pay | Admitting: Internal Medicine

## 2018-06-21 ENCOUNTER — Other Ambulatory Visit (INDEPENDENT_AMBULATORY_CARE_PROVIDER_SITE_OTHER): Payer: Medicare Other

## 2018-06-21 DIAGNOSIS — E119 Type 2 diabetes mellitus without complications: Secondary | ICD-10-CM | POA: Diagnosis not present

## 2018-06-21 LAB — HEPATIC FUNCTION PANEL
ALT: 23 U/L (ref 0–35)
AST: 28 U/L (ref 0–37)
Albumin: 4.7 g/dL (ref 3.5–5.2)
Alkaline Phosphatase: 49 U/L (ref 39–117)
Bilirubin, Direct: 0.1 mg/dL (ref 0.0–0.3)
Total Bilirubin: 0.7 mg/dL (ref 0.2–1.2)
Total Protein: 7.2 g/dL (ref 6.0–8.3)

## 2018-06-21 LAB — BASIC METABOLIC PANEL
BUN: 9 mg/dL (ref 6–23)
CO2: 29 mEq/L (ref 19–32)
Calcium: 9.8 mg/dL (ref 8.4–10.5)
Chloride: 99 mEq/L (ref 96–112)
Creatinine, Ser: 0.98 mg/dL (ref 0.40–1.20)
GFR: 60.17 mL/min (ref >60.00)
Glucose, Bld: 119 mg/dL — ABNORMAL HIGH (ref 70–99)
Potassium: 4.5 mEq/L (ref 3.5–5.1)
Sodium: 134 mEq/L — ABNORMAL LOW (ref 135–145)

## 2018-06-21 LAB — LIPID PANEL
Cholesterol: 134 mg/dL (ref 0–200)
HDL: 84.8 mg/dL (ref >39.00)
LDL Cholesterol: 37 mg/dL (ref 0–99)
NonHDL: 49.29
Total CHOL/HDL Ratio: 2
Triglycerides: 59 mg/dL (ref 0.0–149.0)
VLDL: 11.8 mg/dL (ref 0.0–40.0)

## 2018-06-21 LAB — HEMOGLOBIN A1C: Hgb A1c MFr Bld: 6.4 % (ref 4.6–6.5)

## 2018-06-21 LAB — MICROALBUMIN / CREATININE URINE RATIO
Creatinine,U: 53.4 mg/dL
Microalb Creat Ratio: 1.2 mg/g (ref 0.0–30.0)
Microalb, Ur: 0.7 mg/dL (ref 0.0–1.9)

## 2018-06-23 ENCOUNTER — Ambulatory Visit (INDEPENDENT_AMBULATORY_CARE_PROVIDER_SITE_OTHER): Payer: Medicare Other | Admitting: Internal Medicine

## 2018-06-23 ENCOUNTER — Encounter: Payer: Self-pay | Admitting: Internal Medicine

## 2018-06-23 ENCOUNTER — Other Ambulatory Visit: Payer: Self-pay | Admitting: Internal Medicine

## 2018-06-23 DIAGNOSIS — I10 Essential (primary) hypertension: Secondary | ICD-10-CM | POA: Diagnosis not present

## 2018-06-23 DIAGNOSIS — G479 Sleep disorder, unspecified: Secondary | ICD-10-CM

## 2018-06-23 DIAGNOSIS — E039 Hypothyroidism, unspecified: Secondary | ICD-10-CM | POA: Diagnosis not present

## 2018-06-23 DIAGNOSIS — E119 Type 2 diabetes mellitus without complications: Secondary | ICD-10-CM | POA: Diagnosis not present

## 2018-06-23 MED ORDER — LOSARTAN POTASSIUM 25 MG PO TABS: 25.0000 mg | ORAL_TABLET | Freq: Every day | ORAL | 0 refills | Status: DC

## 2018-06-23 MED ORDER — ESZOPICLONE 2 MG PO TABS: 2.0000 mg | ORAL_TABLET | Freq: Every day | ORAL | 2 refills | Status: DC

## 2018-06-23 MED ORDER — METFORMIN HCL ER (MOD) 1000 MG PO TB24: 1000.0000 mg | ORAL_TABLET | Freq: Two times a day (BID) | ORAL | 5 refills | Status: DC

## 2018-06-23 MED ORDER — LOSARTAN POTASSIUM 25 MG PO TABS: 25.0000 mg | ORAL_TABLET | Freq: Every day | ORAL | 1 refills | Status: DC

## 2018-06-23 NOTE — Progress Notes (Signed)
Patient ID: Donna Howell, female   DOB: 1950-08-20, 67 y.o.   MRN: [id number]   Subjective:    Patient ID: Donna Howell, female    DOB: 02-Dec-1950, 67 y.o.   MRN: [id number]  HPI  Patient here for a scheduled follow up.  She reports she is doing relatively well.  Increased stress.  Caring for her son and her mother.  Does not sleep for long periods of time.  States may sleep 4 hours per night.  Takes lunesta.  Has tried multiple other medications.  Has tried buspar (caused heart racing), ambien (sleep walking), risperdol, trazodone, etc.  Discussed other treatment options.  She has to get up with her mother and has to be awake with her son.  After discussion, it was decided to continue her current regimen.  No chest pain.  Breathing stable.  No acid reflux.  No abdominal pain.  Bowels moving.  Just recently saw cardiology.  Had cardiac MRI - normal heart function, negative ischemia, mild to moderate MR and TR.  No change.  Recommended f/u in 6 months.  On losartan.  Cough resolved.     Past Medical History:  Diagnosis Date  . Arthritis   . Chicken pox   . Diabetes ([place])   . Frequent headaches   . GERD (gastroesophageal reflux disease)   . Hay fever   . Heart murmur   . High blood pressure   . History of bronchitis   . Hypothyroidism    Past Surgical History:  Procedure Laterality Date  . APPENDECTOMY  1974  . TONSILLECTOMY AND ADENOIDECTOMY  1962   Family History  Problem Relation Age of Onset  . Arthritis Mother   . Heart disease Mother   . Diabetes Mother   . Stroke Father   . Arthritis Sister   . Breast cancer Sister 83  . Alcoholism Brother   . Arthritis Maternal Grandmother   . Heart disease Maternal Grandmother   . Diabetes Maternal Grandmother    Social History   Socioeconomic History  . Marital status: Married    Spouse name: Not on file  . Number of children: Not on file  . Years of education: Not on file  . Highest education level: Not on file    Occupational History  . Not on file  Social Needs  . Financial resource strain: Not hard at all  . Food insecurity:    Worry: Never true    Inability: Never true  . Transportation needs:    Medical: No    Non-medical: No  Tobacco Use  . Smoking status: Never Smoker  . Smokeless tobacco: Never Used  Substance and Sexual Activity  . Alcohol use: No  . Drug use: No  . Sexual activity: Not on file  Lifestyle  . Physical activity:    Days per week: 7 days    Minutes per session: 90 min  . Stress: Not at all  Relationships  . Social connections:    Talks on phone: Not on file    Gets together: Not on file    Attends religious service: Not on file    Active member of club or organization: Not on file    Attends meetings of clubs or organizations: Not on file    Relationship status: Not on file  Other Topics Concern  . Not on file  Social History Narrative  . Not on file    Outpatient Encounter Medications as of 06/23/2018  Medication Sig  . eszopiclone (  LUNESTA) 2 MG TABS tablet Take 1 tablet (2 mg total) by mouth at bedtime. Take immediately before bedtime  . levothyroxine (SYNTHROID, LEVOTHROID) 50 MCG tablet TAKE 1 TABLET DAILY  . losartan (COZAAR) 25 MG tablet Take 1 tablet (25 mg total) by mouth daily.  Marland Kitchen losartan (COZAAR) 25 MG tablet Take 1 tablet (25 mg total) by mouth daily.  . metoprolol succinate (TOPROL XL) 25 MG 24 hr tablet TAKE ONE-HALF (1/2) TABLET TWICE A DAY  . ONE TOUCH ULTRA TEST test strip Check blood sugar tid  . rosuvastatin (CRESTOR) 20 MG tablet TAKE 1 TABLET DAILY  . [DISCONTINUED] eszopiclone (LUNESTA) 2 MG TABS tablet Take 1 tablet (2 mg total) by mouth at bedtime. Take immediately before bedtime  . [DISCONTINUED] losartan (COZAAR) 25 MG tablet 1/2 - 1 tablet q day  . [DISCONTINUED] losartan (COZAAR) 25 MG tablet Take 1 tablet (25 mg total) by mouth daily.  . [DISCONTINUED] metFORMIN (GLUCOPHAGE-XR) 500 MG 24 hr tablet TAKE 1 TABLET TWICE A DAY   . metFORMIN (GLUMETZA) 1000 MG (MOD) 24 hr tablet Take 1 tablet (1,000 mg total) by mouth 2 (two) times daily with a meal.   No facility-administered encounter medications on file as of 06/23/2018.     Review of Systems  Constitutional: Negative for appetite change and unexpected weight change.  HENT: Negative for congestion and sinus pressure.   Respiratory: Negative for cough, chest tightness and shortness of breath.   Cardiovascular: Negative for chest pain, palpitations and leg swelling.  Gastrointestinal: Negative for abdominal pain, diarrhea, nausea and vomiting.  Genitourinary: Negative for difficulty urinating and dysuria.  Musculoskeletal: Negative for joint swelling and myalgias.  Skin: Negative for color change and rash.  Neurological: Negative for dizziness, light-headedness and headaches.  Psychiatric/Behavioral: Negative for agitation, dysphoric mood and hallucinations.       Objective:    Physical Exam  Constitutional: She appears well-developed and well-nourished. No distress.  HENT:  Nose: Nose normal.  Mouth/Throat: Oropharynx is clear and moist.  Neck: Neck supple. No thyromegaly present.  Cardiovascular: Normal rate and regular rhythm.  Pulmonary/Chest: Breath sounds normal. No respiratory distress. She has no wheezes.  Abdominal: Soft. Bowel sounds are normal. There is no tenderness.  Musculoskeletal: She exhibits no edema or tenderness.  Lymphadenopathy:    She has no cervical adenopathy.  Skin: No rash noted. No erythema.  Psychiatric: She has a normal mood and affect. Her behavior is normal.    BP 106/70 (BP Location: Left Arm, Patient Position: Sitting, Cuff Size: Normal)   Pulse (!) 59   Temp 97.9 F (36.6 C) (Oral)   Resp 18   Wt 121 lb 9.6 oz (55.2 kg)   SpO2 98%   BMI 20.87 kg/m  Wt Readings from Last 3 Encounters:  06/23/18 121 lb 9.6 oz (55.2 kg)  03/12/18 121 lb (54.9 kg)  02/01/18 125 lb 12.8 oz (57.1 kg)     Lab Results   Component Value Date   WBC 5.0 10/30/2017   HGB 13.4 10/30/2017   HCT 38.8 10/30/2017   PLT 242.0 10/30/2017   GLUCOSE 119 (H) 06/21/2018   CHOL 134 06/21/2018   TRIG 59.0 06/21/2018   HDL 84.80 06/21/2018   LDLCALC 37 06/21/2018   ALT 23 06/21/2018   AST 28 06/21/2018   NA 134 (L) 06/21/2018   K 4.5 06/21/2018   CL 99 06/21/2018   CREATININE 0.98 06/21/2018   BUN 9 06/21/2018   CO2 29 06/21/2018   TSH 1.09  03/09/2018   HGBA1C 6.4 06/21/2018   MICROALBUR 0.7 06/21/2018    Dg Bone Density  Result Date: 09/08/2017 EXAM: DUAL X-RAY ABSORPTIOMETRY (DXA) FOR BONE MINERAL DENSITY IMPRESSION: Dear Dr. Einar Pheasant, Your patient Kaytelyn Glore completed a BMD test on 09/08/2017 using the [place] (analysis version: 14.10) manufactured by [employer]. The following summarizes the results of our evaluation. PATIENT BIOGRAPHICAL: Name: Samayah, Novinger Patient ID: [id number] Birth Date: Dec 04, 1950 Height: 64.0 in. Gender: Female Exam Date: 09/08/2017 Weight: 125.8 lbs. Indications: Caucasian, Diabetic, Hypothyroid, Postmenopausal Fractures: Treatments: Levothyroxine, Multi-Vitamin, Vitamin D ASSESSMENT: The BMD measured at Femur Total Right is 0.883 g/cm2 with a T-score of -1.0. This patient is considered normal according to [facility]) criteria. L3 was excluded due to degenerative changes. Site Region Measured Measured WHO Young Adult BMD Date       Age      Classification T-score AP Spine L1-L4 (L3) 09[age]08/2017 66.1 Normal 0.0 1.182 g/cm2 DualFemur Total Right 09/08/2017 66.1 Normal -1.0 0.883 g/cm2 World Health Organization [facility]) criteria for post-menopausal, Caucasian Women: Normal:       T-score at or above -1 SD Osteopenia:   T-score between -1 and -2.5 SD Osteoporosis: T-score at or below -2.5 SD RECOMMENDATIONS: [place] recommends that FDA-approved medical therapies be considered in postmenopausal women and men age 90 or older with  a: 1. Hip or vertebral (clinical or morphometric) fracture. 2. T-score of < -2.5 at the spine or hip. 3. Ten-year fracture probability by FRAX of 3% or greater for hip fracture or 20% or greater for major osteoporotic fracture. All treatment decisions require clinical judgment and consideration of individual patient factors, including patient preferences, co-morbidities, previous drug use, risk factors not captured in the FRAX model (e.g. falls, vitamin D deficiency, increased bone turnover, interval significant decline in bone density) and possible under - or over-estimation of fracture risk by FRAX. All patients should ensure an adequate intake of dietary calcium (1200 mg/d) and vitamin D (800 IU daily) unless contraindicated. FOLLOW-UP: People with diagnosed cases of osteoporosis or at high risk for fracture should have regular bone mineral density tests. For patients eligible for Medicare, routine testing is allowed once every 2 years. The testing frequency can be increased to one year for patients who have rapidly progressing disease, those who are receiving or discontinuing medical therapy to restore bone mass, or have additional risk factors. I have reviewed this report, and agree with the above findings. [facility] Electronically Signed   By: Earle Gell M.D.   On: 09/08/2017 10:12       Assessment & Plan:   Problem List Items Addressed This Visit    Diabetes mellitus without complication ([place])    Recent a1c 6.4.  She again discussed concern that her a1c is not lower.  She previously was on 1054m bid and request to be placed back on this dose of metformin.  Watches her diet.  Exercises regularly.  Will increased metformin to 10070mbid (extended release form).  Follow met b and a1c.        Relevant Medications   metFORMIN (GLUMETZA) 1000 MG (MOD) 24 hr tablet   losartan (COZAAR) 25 MG tablet   losartan (COZAAR) 25 MG tablet   Other Relevant Orders   Hemoglobin A1c   Hepatic  function panel   Lipid panel   Basic metabolic panel   Difficulty sleeping    On lunesta. Has tried multiple medications and had intolerance, etc.  Discussed  treatment options.  Elected to continue on her current regimen.  Follow.        Hypertension, essential    Blood pressure under good control.  Continue same medication regimen.  Follow pressures.  Follow metabolic panel.  Doing well on losartan.  Cough resolved.        Relevant Medications   losartan (COZAAR) 25 MG tablet   losartan (COZAAR) 25 MG tablet   Other Relevant Orders   CBC with Differential/Platelet   Hypothyroidism    On thyroid replacement.  Follow tsh.            Einar Pheasant, MD

## 2018-06-26 ENCOUNTER — Encounter: Payer: Self-pay | Admitting: Internal Medicine

## 2018-06-26 DIAGNOSIS — G479 Sleep disorder, unspecified: Secondary | ICD-10-CM | POA: Insufficient documentation

## 2018-06-26 NOTE — Assessment & Plan Note (Signed)
On thyroid replacement.  Follow tsh.  

## 2018-06-26 NOTE — Assessment & Plan Note (Signed)
On lunesta. Has tried multiple medications and had intolerance, etc.  Discussed treatment options.  Elected to continue on her current regimen.  Follow.

## 2018-06-26 NOTE — Assessment & Plan Note (Signed)
Blood pressure under good control.  Continue same medication regimen.  Follow pressures.  Follow metabolic panel.  Doing well on losartan.  Cough resolved.

## 2018-06-26 NOTE — Assessment & Plan Note (Signed)
Recent a1c 6.4.  She again discussed concern that her a1c is not lower.  She previously was on 1081m bid and request to be placed back on this dose of metformin.  Watches her diet.  Exercises regularly.  Will increased metformin to 10032mbid (extended release form).  Follow met b and a1c.

## 2018-06-29 ENCOUNTER — Telehealth: Payer: Self-pay | Admitting: *Deleted

## 2018-06-29 NOTE — Telephone Encounter (Signed)
Copied from CRM [id number]. Topic: General - Other >> Jun 29, 2018  3:27 PM Arlyss Gandyichardson, Taren N, NT wrote: Reason for CRM: Pt returning a call to [place].

## 2018-06-29 NOTE — Telephone Encounter (Signed)
Signed and placed in box.  Also notify pt.

## 2018-06-29 NOTE — Telephone Encounter (Signed)
Received fax from [facility] this morning in regards to the metformin that was prescribed not being covered by insurance. 2 alternative forms of metformin are listed. Patient is aware and said that she is okay to switch to the one that you recommend. She does not want to switch to a different medication that is not metformin. Advised patient that I would give form to you and give her a call after you have reviewed. Form is in quick sign folder

## 2018-06-29 NOTE — Telephone Encounter (Signed)
Script faxed. Left message for patient to call back

## 2018-06-30 NOTE — Telephone Encounter (Signed)
Called patient to notify her that Dr. Lorin PicketScott has sent in an alternative medication for her metformin

## 2018-07-23 ENCOUNTER — Encounter: Payer: Self-pay | Admitting: Internal Medicine

## 2018-07-29 NOTE — Telephone Encounter (Signed)
Called patient. She is having increased lower back and right hip discomfort after the holidays due to being on her feet a lot and putting up/taking down decorations. She does have chronic back issues and she has not done anything that could've caused injury. Patient would like to have something that she can take as needed during times like this. She has taken meloxicam and naproxen in the past but does not like the way they make her feel. Her sister takes ibuprofen 800 mg prn and pt was wondering if this is something that she could try or if there is something else you recommend.

## 2018-07-29 NOTE — Telephone Encounter (Signed)
The ibuprofen is the same medication as naproxen and meloxicam.  If no allergy to tylenol, I would recommend tylenol arthritis two tablets twice a day as needed for pain.  If persistent pain, needs to be evaluated.

## 2018-07-30 ENCOUNTER — Telehealth: Payer: Self-pay | Admitting: *Deleted

## 2018-07-30 NOTE — Telephone Encounter (Signed)
LMTCB

## 2018-07-30 NOTE — Telephone Encounter (Signed)
Copied from CRM [id number]. Topic: General - Other >> Jul 30, 2018  4:28 PM Angela Nevin wrote: Reason for CRM: Patient returning call back from [place]. See patient message from 12/27.

## 2018-08-03 NOTE — Telephone Encounter (Signed)
Patient returning Trisha's call.

## 2018-08-03 NOTE — Telephone Encounter (Signed)
LMTCB

## 2018-08-03 NOTE — Telephone Encounter (Signed)
See my chart message

## 2018-08-04 NOTE — Telephone Encounter (Signed)
See mychart message for documentation 

## 2018-08-04 NOTE — Telephone Encounter (Signed)
Patient will try tylenol arthritis and see if that helps

## 2018-09-07 ENCOUNTER — Other Ambulatory Visit: Payer: Self-pay | Admitting: Internal Medicine

## 2018-09-08 MED ORDER — LOSARTAN POTASSIUM 25 MG PO TABS: 25.0000 mg | ORAL_TABLET | Freq: Every day | ORAL | 1 refills | Status: DC

## 2018-09-25 ENCOUNTER — Encounter: Payer: Self-pay | Admitting: Internal Medicine

## 2018-09-27 MED ORDER — ESZOPICLONE 2 MG PO TABS: 2.0000 mg | ORAL_TABLET | Freq: Every day | ORAL | 2 refills | Status: DC

## 2018-09-27 NOTE — Telephone Encounter (Signed)
Last OV 06/23/18 Next OV 10/26/2018 Last refill 06/23/18

## 2018-10-07 ENCOUNTER — Encounter: Payer: Self-pay | Admitting: Internal Medicine

## 2018-10-09 ENCOUNTER — Encounter: Payer: Self-pay | Admitting: Internal Medicine

## 2018-10-12 ENCOUNTER — Encounter: Payer: Self-pay | Admitting: Internal Medicine

## 2018-10-22 ENCOUNTER — Other Ambulatory Visit (INDEPENDENT_AMBULATORY_CARE_PROVIDER_SITE_OTHER): Payer: Medicare Other

## 2018-10-22 ENCOUNTER — Other Ambulatory Visit: Payer: Self-pay

## 2018-10-22 DIAGNOSIS — E119 Type 2 diabetes mellitus without complications: Secondary | ICD-10-CM

## 2018-10-22 DIAGNOSIS — I10 Essential (primary) hypertension: Secondary | ICD-10-CM

## 2018-10-22 LAB — LIPID PANEL
Cholesterol: 130 mg/dL (ref 0–200)
HDL: 78.3 mg/dL (ref >39.00)
LDL Cholesterol: 37 mg/dL (ref 0–99)
NonHDL: 51.42
Total CHOL/HDL Ratio: 2
Triglycerides: 71 mg/dL (ref 0.0–149.0)
VLDL: 14.2 mg/dL (ref 0.0–40.0)

## 2018-10-22 LAB — BASIC METABOLIC PANEL
BUN: 12 mg/dL (ref 6–23)
CO2: 30 mEq/L (ref 19–32)
Calcium: 9.7 mg/dL (ref 8.4–10.5)
Chloride: 99 mEq/L (ref 96–112)
Creatinine, Ser: 0.95 mg/dL (ref 0.40–1.20)
GFR: 58.62 mL/min — ABNORMAL LOW (ref >60.00)
Glucose, Bld: 107 mg/dL — ABNORMAL HIGH (ref 70–99)
Potassium: 4.5 mEq/L (ref 3.5–5.1)
Sodium: 136 mEq/L (ref 135–145)

## 2018-10-22 LAB — HEPATIC FUNCTION PANEL
ALT: 25 U/L (ref 0–35)
AST: 26 U/L (ref 0–37)
Albumin: 4.7 g/dL (ref 3.5–5.2)
Alkaline Phosphatase: 45 U/L (ref 39–117)
Bilirubin, Direct: 0.1 mg/dL (ref 0.0–0.3)
Total Bilirubin: 0.6 mg/dL (ref 0.2–1.2)
Total Protein: 6.9 g/dL (ref 6.0–8.3)

## 2018-10-22 LAB — CBC WITH DIFFERENTIAL/PLATELET
Basophils Absolute: 0.1 10*3/uL (ref 0.0–0.1)
Basophils Relative: 1.4 % (ref 0.0–3.0)
Eosinophils Absolute: 0.2 10*3/uL (ref 0.0–0.7)
Eosinophils Relative: 4.7 % (ref 0.0–5.0)
HCT: 38.5 % (ref 36.0–46.0)
Hemoglobin: 13.3 g/dL (ref 12.0–15.0)
Lymphocytes Relative: 36.1 % (ref 12.0–46.0)
Lymphs Abs: 1.8 10*3/uL (ref 0.7–4.0)
MCHC: 34.4 g/dL (ref 30.0–36.0)
MCV: 89.9 fl (ref 78.0–100.0)
Monocytes Absolute: 0.6 10*3/uL (ref 0.1–1.0)
Monocytes Relative: 12.4 % — ABNORMAL HIGH (ref 3.0–12.0)
Neutro Abs: 2.3 10*3/uL (ref 1.4–7.7)
Neutrophils Relative %: 45.4 % (ref 43.0–77.0)
Platelets: 256 10*3/uL (ref 150.0–400.0)
RBC: 4.29 Mil/uL (ref 3.87–5.11)
RDW: 12.7 % (ref 11.5–15.5)
WBC: 5 10*3/uL (ref 4.0–10.5)

## 2018-10-22 LAB — HEMOGLOBIN A1C: Hgb A1c MFr Bld: 6.4 % (ref 4.6–6.5)

## 2018-10-24 ENCOUNTER — Encounter: Payer: Self-pay | Admitting: Internal Medicine

## 2018-10-26 ENCOUNTER — Other Ambulatory Visit: Payer: Self-pay

## 2018-10-26 ENCOUNTER — Encounter: Payer: Self-pay | Admitting: Internal Medicine

## 2018-10-26 ENCOUNTER — Ambulatory Visit (INDEPENDENT_AMBULATORY_CARE_PROVIDER_SITE_OTHER): Payer: Medicare Other | Admitting: Internal Medicine

## 2018-10-26 VITALS — BP 118/70 | HR 72 | Temp 97.7°F | Resp 16 | Wt 123.0 lb

## 2018-10-26 DIAGNOSIS — Z23 Encounter for immunization: Secondary | ICD-10-CM | POA: Diagnosis not present

## 2018-10-26 DIAGNOSIS — Z9109 Other allergy status, other than to drugs and biological substances: Secondary | ICD-10-CM

## 2018-10-26 DIAGNOSIS — E119 Type 2 diabetes mellitus without complications: Secondary | ICD-10-CM

## 2018-10-26 DIAGNOSIS — I10 Essential (primary) hypertension: Secondary | ICD-10-CM

## 2018-10-26 DIAGNOSIS — E039 Hypothyroidism, unspecified: Secondary | ICD-10-CM

## 2018-10-26 DIAGNOSIS — G479 Sleep disorder, unspecified: Secondary | ICD-10-CM | POA: Diagnosis not present

## 2018-10-26 MED ORDER — ESZOPICLONE 2 MG PO TABS: 2.0000 mg | ORAL_TABLET | Freq: Every day | ORAL | 2 refills | Status: DC

## 2018-10-26 NOTE — Progress Notes (Signed)
Subjective:    Patient ID: Donna Howell, female    DOB: July 16, 1951, 68 y.o.   MRN: [id number]  HPI  Patient here for a scheduled follow up.  She reports she is doing relatively well.  Stays active.  Is exercising. No chest pain.  No sob. No acid reflux.  No abdominal pain.  Bowels moving.  Saw cardiology 04/2018.  Had previous holter monitor - SR with occasional PACs and PVCs.  No sustained arrhythmias.  Also f/u cardiomyopathy.  Had cardiac MRI with stress revealed no evidence of structural heart disease other than mitral and tricuspid valve prolapse.  LV and RV were normal.  There was no ischemia. EF - normal.  Felt stable.  Recommended f/u in 6 months. Has f/u scheduled in 11/2018.  Sent her cologuard in, but the company notified her they could not run.  She has another kit that she pnas to return.  Still needs lunesta to help her sleep.  Increased stress related to her family's medical issues and stress related to the corona virus.  Discussed with her today.  Overall she feels she is handling things relatively well.     Past Medical History:  Diagnosis Date  . Arthritis   . Chicken pox   . Diabetes ([place])   . Frequent headaches   . GERD (gastroesophageal reflux disease)   . Hay fever   . Heart murmur   . High blood pressure   . History of bronchitis   . Hypothyroidism    Past Surgical History:  Procedure Laterality Date  . APPENDECTOMY  1974  . TONSILLECTOMY AND ADENOIDECTOMY  1962   Family History  Problem Relation Age of Onset  . Arthritis Mother   . Heart disease Mother   . Diabetes Mother   . Stroke Father   . Arthritis Sister   . Breast cancer Sister 56  . Alcoholism Brother   . Arthritis Maternal Grandmother   . Heart disease Maternal Grandmother   . Diabetes Maternal Grandmother    Social History   Socioeconomic History  . Marital status: Married    Spouse name: Not on file  . Number of children: Not on file  . Years of education: Not on file  . Highest  education level: Not on file  Occupational History  . Not on file  Social Needs  . Financial resource strain: Not hard at all  . Food insecurity:    Worry: Never true    Inability: Never true  . Transportation needs:    Medical: No    Non-medical: No  Tobacco Use  . Smoking status: Never Smoker  . Smokeless tobacco: Never Used  Substance and Sexual Activity  . Alcohol use: No  . Drug use: No  . Sexual activity: Not on file  Lifestyle  . Physical activity:    Days per week: 7 days    Minutes per session: 90 min  . Stress: Not at all  Relationships  . Social connections:    Talks on phone: Not on file    Gets together: Not on file    Attends religious service: Not on file    Active member of club or organization: Not on file    Attends meetings of clubs or organizations: Not on file    Relationship status: Not on file  Other Topics Concern  . Not on file  Social History Narrative  . Not on file    Outpatient Encounter Medications as of 10/26/2018  Medication Sig  .  eszopiclone (LUNESTA) 2 MG TABS tablet Take 1 tablet (2 mg total) by mouth at bedtime. Take immediately before bedtime  . levothyroxine (SYNTHROID, LEVOTHROID) 50 MCG tablet TAKE 1 TABLET DAILY  . losartan (COZAAR) 25 MG tablet Take 1 tablet (25 mg total) by mouth daily.  . metFORMIN (GLUMETZA) 1000 MG (MOD) 24 hr tablet Take 1 tablet (1,000 mg total) by mouth 2 (two) times daily with a meal.  . metoprolol succinate (TOPROL XL) 25 MG 24 hr tablet TAKE ONE-HALF (1/2) TABLET TWICE A DAY  . ONE TOUCH ULTRA TEST test strip Check blood sugar tid  . rosuvastatin (CRESTOR) 20 MG tablet TAKE 1 TABLET DAILY  . [DISCONTINUED] eszopiclone (LUNESTA) 2 MG TABS tablet Take 1 tablet (2 mg total) by mouth at bedtime. Take immediately before bedtime   No facility-administered encounter medications on file as of 10/26/2018.     Review of Systems  Constitutional: Negative for appetite change and unexpected weight change.   HENT: Negative for congestion and sinus pressure.   Respiratory: Negative for cough, chest tightness and shortness of breath.   Cardiovascular: Negative for chest pain, palpitations and leg swelling.  Gastrointestinal: Negative for abdominal pain, constipation, diarrhea and vomiting.  Genitourinary: Negative for difficulty urinating and dysuria.  Musculoskeletal: Negative for joint swelling and myalgias.  Skin: Negative for color change and rash.  Neurological: Negative for dizziness, light-headedness and headaches.  Psychiatric/Behavioral: Negative for agitation and dysphoric mood.       Objective:    Physical Exam Constitutional:      General: She is not in acute distress.    Appearance: Normal appearance.  HENT:     Nose: Nose normal. No congestion.     Mouth/Throat:     Pharynx: No oropharyngeal exudate or posterior oropharyngeal erythema.  Neck:     Musculoskeletal: Neck supple. No muscular tenderness.     Thyroid: No thyromegaly.  Cardiovascular:     Rate and Rhythm: Normal rate and regular rhythm.  Pulmonary:     Effort: No respiratory distress.     Breath sounds: Normal breath sounds. No wheezing.  Abdominal:     General: Bowel sounds are normal.     Palpations: Abdomen is soft.     Tenderness: There is no abdominal tenderness.  Musculoskeletal:        General: No swelling or tenderness.  Lymphadenopathy:     Cervical: No cervical adenopathy.  Skin:    Findings: No erythema or rash.  Neurological:     Mental Status: She is alert.  Psychiatric:        Mood and Affect: Mood normal.        Behavior: Behavior normal.     BP 118/70   Pulse 72   Temp 97.7 F (36.5 C) (Oral)   Resp 16   Wt 123 lb (55.8 kg)   SpO2 99%   BMI 21.11 kg/m  Wt Readings from Last 3 Encounters:  10/26/18 123 lb (55.8 kg)  06/23/18 121 lb 9.6 oz (55.2 kg)  03/12/18 121 lb (54.9 kg)     Lab Results  Component Value Date   WBC 5.0 10/22/2018   HGB 13.3 10/22/2018   HCT 38.5  10/22/2018   PLT 256.0 10/22/2018   GLUCOSE 107 (H) 10/22/2018   CHOL 130 10/22/2018   TRIG 71.0 10/22/2018   HDL 78.30 10/22/2018   LDLCALC 37 10/22/2018   ALT 25 10/22/2018   AST 26 10/22/2018   NA 136 10/22/2018   K 4.5 10/22/2018  CL 99 10/22/2018   CREATININE 0.95 10/22/2018   BUN 12 10/22/2018   CO2 30 10/22/2018   TSH 1.09 03/09/2018   HGBA1C 6.4 10/22/2018   MICROALBUR 0.7 06/21/2018    Dg Bone Density  Result Date: 09/08/2017 EXAM: DUAL X-RAY ABSORPTIOMETRY (DXA) FOR BONE MINERAL DENSITY IMPRESSION: Dear Dr. Einar Pheasant, Your patient Danetra Glock completed a BMD test on 09/08/2017 using the [place] (analysis version: 14.10) manufactured by [employer]. The following summarizes the results of our evaluation. PATIENT BIOGRAPHICAL: Name: Donna Howell, Donna Howell Patient ID: [id number] Birth Date: April 09, 1951 Height: 64.0 in. Gender: Female Exam Date: 09/08/2017 Weight: 125.8 lbs. Indications: Caucasian, Diabetic, Hypothyroid, Postmenopausal Fractures: Treatments: Levothyroxine, Multi-Vitamin, Vitamin D ASSESSMENT: The BMD measured at Femur Total Right is 0.883 g/cm2 with a T-score of -1.0. This patient is considered normal according to [facility]) criteria. L3 was excluded due to degenerative changes. Site Region Measured Measured WHO Young Adult BMD Date       Age      Classification T-score AP Spine L1-L4 (L3) 09/08/2017 66.1 Normal 0.0 1.182 g/cm2 DualFemur Total Right 09/08/2017 66.1 Normal -1.0 0.883 g/cm2 World [facility]) criteria for post-menopausal, Caucasian Women: Normal:       T-score at or above -1 SD Osteopenia:   T-score between -1 and -2.5 SD Osteoporosis: T-score at or below -2.5 SD RECOMMENDATIONS: [place] recommends that FDA-approved medical therapies be considered in postmenopausal women and men age 49 or older with a: 1. Hip or vertebral (clinical or morphometric) fracture. 2. T-score of < -2.5 at  the spine or hip. 3. Ten-year fracture probability by FRAX of 3% or greater for hip fracture or 20% or greater for major osteoporotic fracture. All treatment decisions require clinical judgment and consideration of individual patient factors, including patient preferences, co-morbidities, previous drug use, risk factors not captured in the FRAX model (e.g. falls, vitamin D deficiency, increased bone turnover, interval significant decline in bone density) and possible under - or over-estimation of fracture risk by FRAX. All patients should ensure an adequate intake of dietary calcium (1200 mg/d) and vitamin D (800 IU daily) unless contraindicated. FOLLOW-UP: People with diagnosed cases of osteoporosis or at high risk for fracture should have regular bone mineral density tests. For patients eligible for Medicare, routine testing is allowed once every 2 years. The testing frequency can be increased to one year for patients who have rapidly progressing disease, those who are receiving or discontinuing medical therapy to restore bone mass, or have additional risk factors. I have reviewed this report, and agree with the above findings. [facility] Electronically Signed   By: Earle Gell M.D.   On: 09/08/2017 10:12       Assessment & Plan:   Problem List Items Addressed This Visit    Diabetes mellitus without complication ([place])    T9Q 6.4.  Discussed with her today.  On metformin.  She is watching her diet.  Exercising.  Hold on additional medication.  Follow met b and a1c.        Relevant Orders   Lipid panel   Hemoglobin A1c   Hepatic function panel   Difficulty sleeping    On lunesta.  Doing well on this medication.  Follow.       Environmental allergies    nasacort nasal spray as directed.  Follow.        Hypertension, essential    Blood pressure under good control.  Continue same medication regimen.  Follow pressures.  Follow metabolic panel.        Relevant Orders   Basic  metabolic panel   Hypothyroidism    On thyroid replacement.  Follow tsh.        Relevant Orders   TSH    Other Visit Diagnoses    Need for 23-polyvalent pneumococcal polysaccharide vaccine    -  Primary   Pneumonia vaccine given today.    Relevant Orders   Pneumococcal polysaccharide vaccine 23-valent greater than or equal to 2yo subcutaneous/IM (Completed)       Einar Pheasant, MD

## 2018-10-30 ENCOUNTER — Encounter: Payer: Self-pay | Admitting: Internal Medicine

## 2018-10-30 DIAGNOSIS — Z9109 Other allergy status, other than to drugs and biological substances: Secondary | ICD-10-CM | POA: Insufficient documentation

## 2018-10-30 NOTE — Assessment & Plan Note (Signed)
On lunesta.  Doing well on this medication.  Follow.

## 2018-10-30 NOTE — Assessment & Plan Note (Signed)
nasacort nasal spray as directed.  Follow.  

## 2018-10-30 NOTE — Assessment & Plan Note (Signed)
On thyroid replacement.  Follow tsh.  

## 2018-10-30 NOTE — Assessment & Plan Note (Signed)
Blood pressure under good control.  Continue same medication regimen.  Follow pressures.  Follow metabolic panel.   

## 2018-10-30 NOTE — Assessment & Plan Note (Signed)
a1c 6.4.  Discussed with her today.  On metformin.  She is watching her diet.  Exercising.  Hold on additional medication.  Follow met b and a1c.

## 2019-01-12 ENCOUNTER — Other Ambulatory Visit: Payer: Self-pay | Admitting: Orthopedic Surgery

## 2019-01-12 DIAGNOSIS — G8929 Other chronic pain: Secondary | ICD-10-CM

## 2019-01-12 DIAGNOSIS — M25512 Pain in left shoulder: Secondary | ICD-10-CM

## 2019-01-12 DIAGNOSIS — S46012A Strain of muscle(s) and tendon(s) of the rotator cuff of left shoulder, initial encounter: Secondary | ICD-10-CM | POA: Diagnosis not present

## 2019-01-24 ENCOUNTER — Encounter: Payer: Self-pay | Admitting: Internal Medicine

## 2019-01-25 ENCOUNTER — Other Ambulatory Visit: Payer: Self-pay

## 2019-01-25 MED ORDER — ESZOPICLONE 2 MG PO TABS: 2.0000 mg | ORAL_TABLET | Freq: Every day | ORAL | 2 refills | Status: DC

## 2019-01-25 NOTE — Progress Notes (Signed)
Last OV 10/26/2018 Next OV 03/14/19 Last refill 10/26/2018

## 2019-01-25 NOTE — Progress Notes (Signed)
rx ok'd for lunesta #30 with 2 refills.   

## 2019-01-29 ENCOUNTER — Ambulatory Visit: Payer: Medicare Other

## 2019-02-01 ENCOUNTER — Other Ambulatory Visit: Payer: Self-pay

## 2019-02-01 ENCOUNTER — Ambulatory Visit
Admission: RE | Admit: 2019-02-01 | Discharge: 2019-02-01 | Disposition: A | Discharge status: Home / Self Care | Payer: Medicare Other | Source: Ambulatory Visit | Attending: Orthopedic Surgery | Admitting: Orthopedic Surgery

## 2019-02-01 DIAGNOSIS — G8929 Other chronic pain: Secondary | ICD-10-CM

## 2019-02-01 DIAGNOSIS — M25512 Pain in left shoulder: Secondary | ICD-10-CM | POA: Insufficient documentation

## 2019-02-01 DIAGNOSIS — M19012 Primary osteoarthritis, left shoulder: Secondary | ICD-10-CM | POA: Diagnosis not present

## 2019-02-04 ENCOUNTER — Ambulatory Visit (INDEPENDENT_AMBULATORY_CARE_PROVIDER_SITE_OTHER): Payer: Medicare Other

## 2019-02-04 ENCOUNTER — Other Ambulatory Visit: Payer: Self-pay

## 2019-02-04 DIAGNOSIS — Z0000 Encounter for general adult medical examination without abnormal findings: Secondary | ICD-10-CM

## 2019-02-04 NOTE — Progress Notes (Signed)
Subjective:   Donna HansenDeborah Howell is a 68 y.o. female who presents for Medicare Annual (Subsequent) preventive examination.  Review of Systems:  No ROS.  Medicare Wellness Virtual Visit.  Visual/audio telehealth visit, UTA vital signs.   See social history for additional risk factors.   Cardiac Risk Factors include: advanced age (>7355men, 35>65 women);hypertension;diabetes mellitus     Objective:     Vitals: There were no vitals taken for this visit.  There is no height or weight on file to calculate BMI.  Advanced Directives 02/04/2019 02/01/2018  Does Patient Have a Medical Advance Directive? Yes Yes  Type of Estate agentAdvance Directive Healthcare Power of [place] and [place]orney;Living will Healthcare Power of [place]orney;Living will  Does patient want to make changes to medical advance directive? No - Patient declined No - Patient declined  Copy of Healthcare Power of Attorney in Chart? No - copy requested No - copy requested    Tobacco Social History   Tobacco Use  Smoking Status Never Smoker  Smokeless Tobacco Never Used     Counseling given: Not Answered   Clinical Intake:  Pre-visit preparation completed: Yes        Diabetes: Yes(Followed by pcp)  How often do you need to have someone help you when you read instructions, pamphlets, or other written materials from your doctor or pharmacy?: 1 - Never  Interpreter Needed?: No     Past Medical History:  Diagnosis Date  . Arthritis   . Chicken pox   . Diabetes ([facility])   . Frequent headaches   . GERD (gastroesophageal reflux disease)   . Hay fever   . Heart murmur   . High blood pressure   . History of bronchitis   . Hypothyroidism    Past Surgical History:  Procedure Laterality Date  . APPENDECTOMY  1974  . TONSILLECTOMY AND ADENOIDECTOMY  1962   Family History  Problem Relation Age of Onset  . Arthritis Mother   . Heart disease Mother   . Diabetes Mother   . Stroke Father   . Arthritis Sister   . Breast cancer Sister 455  .  Alcoholism Brother   . Arthritis Maternal Grandmother   . Heart disease Maternal Grandmother   . Diabetes Maternal Grandmother    Social History   Socioeconomic History  . Marital status: Married    Spouse name: Not on file  . Number of children: Not on file  . Years of education: Not on file  . Highest education level: Not on file  Occupational History  . Not on file  Social Needs  . Financial resource strain: Not hard at all  . Food insecurity    Worry: Never true    Inability: Never true  . Transportation needs    Medical: No    Non-medical: No  Tobacco Use  . Smoking status: Never Smoker  . Smokeless tobacco: Never Used  Substance and Sexual Activity  . Alcohol use: No  . Drug use: No  . Sexual activity: Not on file  Lifestyle  . Physical activity    Days per week: 7 days    Minutes per session: 90 min  . Stress: Not at all  Relationships  . Social Musicianconnections    Talks on phone: Not on file    Gets together: Not on file    Attends religious service: Not on file    Active member of club or organization: Not on file    Attends meetings of clubs or organizations: Not on  file    Relationship status: Not on file  Other Topics Concern  . Not on file  Social History Narrative  . Not on file    Outpatient Encounter Medications as of 02/04/2019  Medication Sig  . eszopiclone (LUNESTA) 2 MG TABS tablet Take 1 tablet (2 mg total) by mouth at bedtime. Take immediately before bedtime  . levothyroxine (SYNTHROID, LEVOTHROID) 50 MCG tablet TAKE 1 TABLET DAILY  . losartan (COZAAR) 25 MG tablet Take 1 tablet (25 mg total) by mouth daily.  . metFORMIN (GLUMETZA) 1000 MG (MOD) 24 hr tablet Take 1 tablet (1,000 mg total) by mouth 2 (two) times daily with a meal.  . metoprolol succinate (TOPROL XL) 25 MG 24 hr tablet TAKE ONE-HALF (1/2) TABLET TWICE A DAY  . ONE TOUCH ULTRA TEST test strip Check blood sugar tid  . rosuvastatin (CRESTOR) 20 MG tablet TAKE 1 TABLET DAILY   No  facility-administered encounter medications on file as of 02/04/2019.     Activities of Daily Living In your present state of health, do you have any difficulty performing the following activities: 02/04/2019  Hearing? N  Vision? N  Difficulty concentrating or making decisions? N  Walking or climbing stairs? N  Dressing or bathing? N  Doing errands, shopping? N  Preparing Food and eating ? N  Using the Toilet? N  In the past six months, have you accidently leaked urine? N  Do you have problems with loss of bowel control? N  Managing your Medications? N  Managing your Finances? N  Housekeeping or managing your Housekeeping? N  Some recent data might be hidden    Patient Care Team: Dale DurhamScott, Charlene, MD as PCP - General (Internal Medicine)    Assessment:   This is a routine wellness examination for Donna PoundDeborah.  I connected with patient 02/04/19 at 11:00 AM EDT by an audio enabled telemedicine application and verified that I am speaking with the correct person using two identifiers. Patient stated full name and DOB. Patient gave permission to continue with virtual visit. Patient's location was at home and Nurse's location was at [place] office.   Fasting lab appointment scheduled 1 week prior to cpe in August  Health Screenings  Mammogram - 11/2016 Cologuard- 10/2018; no result Bone Density - 08/2017 Glaucoma -none Hearing -demonstrates normal hearing during visit. Hemoglobin A1C - 09/2018 Cholesterol - 09/2018 Dental- bottom dentures. Vision- visits within the last 12 months.  Social  Alcohol intake - no          Smoking history- never    Smokers in home? none Illicit drug use? none Exercise - walking 5 days weekly, 70 minutes Diet - she tries to eat a healthy diet Sexually Active -not currently BMI- discussed the importance of a healthy diet, water intake and the benefits of aerobic exercise.  Educational material provided.   Safety  Patient feels safe at home- yes Patient  does have smoke detectors at home- yes Patient does wear sunscreen or protective clothing when in direct sunlight -yes Patient does wear seat belt when in a moving vehicle -yes Patient drives- yes  Covid-19 precautions and sickness symptoms discussed.   Activities of Daily Living Patient denies needing assistance with: driving, household chores, feeding themselves, getting from bed to chair, getting to the toilet, bathing/showering, dressing, managing money, or preparing meals.  No new identified risk were noted.    Depression Screen Patient denies losing interest in daily life, feeling hopeless, or crying easily over simple problems.  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    Medication-taking as directed and without issues.   Fall Screen Patient denies being afraid of falling or falling in the last year.   Memory Screen Patient is alert.  Patient denies difficulty focusing, concentrating or misplacing items. Correctly identified the president of the [place], season and recall. Patient likes to machine embroidery and quilt for brain stimulation.  Immunizations The following Immunizations were discussed: Influenza, shingles, pneumonia, and tetanus.   Other Providers Patient Care Team: Dale DurhamScott, Charlene, MD as PCP - General (Internal Medicine)  Exercise Activities and Dietary recommendations Current Exercise Habits: Home exercise routine, Type of exercise: walking, Time (Minutes): > 60, Frequency (Times/Week): 5, Weekly Exercise (Minutes/Week): 0, Intensity: Mild  Goals      Patient Stated   . Follow up with Primary Care Provider (pt-stated)     Keep all routine maintenance appointments Monitor blood sugar daily       Fall Risk Fall Risk  02/04/2019 02/01/2018 08/04/2017 11/06/2016  Falls in the past year? 0 No No No   Depression Screen PHQ 2/9 Scores 02/04/2019 02/01/2018 08/04/2017 11/06/2016  PHQ - 2 Score 0 0 0 0     Cognitive Function  MMSE - Mini Mental State Exam 02/01/2018  Orientation to time 5  Orientation to Place 5  Registration 3  Attention/ Calculation 5  Recall 3  Language- name 2 objects 2  Language- repeat 1  Language- follow 3 step command 3  Language- read & follow direction 1  Write a sentence 1  Copy design 1  Total score 30     6CIT Screen 02/04/2019  What Year? 0 points  What month? 0 points  What time? 0 points  Count back from 20 0 points  Months in reverse 0 points  Repeat phrase 0 points  Total Score 0    Immunization History  Administered Date(s) Administered  . Influenza Whole 05/04/2017  . Influenza,inj,Quad PF,6+ Mos 05/04/2017  . Influenza-Unspecified 05/03/2018  . Pneumococcal Conjugate-13 08/01/2017  . Pneumococcal Polysaccharide-23 10/26/2018   Screening Tests Health Maintenance  Topic Date Due  . TETANUS/TDAP  07/09/1970  . Fecal DNA (Cologuard)  07/09/2001  . FOOT EXAM  02/25/2018  . MAMMOGRAM  12/06/2018  . OPHTHALMOLOGY EXAM  01/19/2019  . INFLUENZA VACCINE  02/26/2019  . HEMOGLOBIN A1C  04/24/2019  . DEXA SCAN  Completed  . Hepatitis C Screening  Completed  . PNA vac Low Risk Adult  Completed      Plan:    End of life planning; Advance aging; Advanced directives discussed.  Copy of current HCPOA/Living Will requested.    I have personally reviewed and noted the following in the patient's chart:   . Medical and social history . Use of alcohol, tobacco or illicit drugs  . Current medications and supplements . Functional ability and status . Nutritional status . Physical activity . Advanced directives . List of other physicians . Hospitalizations, surgeries, and ER visits in previous 12 months . Vitals . Screenings to include cognitive, depression, and falls . Referrals and appointments  In addition, I have reviewed and discussed with patient certain preventive protocols, quality metrics, and best practice recommendations. A written personalized care  plan for preventive services as well as general preventive health recommendations were provided to patient.     Ashok PallOBrien-Blaney, Altamese Deguire L, LPN  [id number]/45407/04/2019   Reviewed above information.  Agree with assessment and plan.    Dr  Scott

## 2019-02-04 NOTE — Patient Instructions (Addendum)
  Ms. Cuoco , Thank you for taking time to come for your Medicare Wellness Visit. I appreciate your ongoing commitment to your health goals. Please review the following plan we discussed and let me know if I can assist you in the future.   These are the goals we discussed: Goals      Patient Stated   . Follow up with Primary Care Provider (pt-stated)     Keep all routine maintenance appointments Monitor blood sugar daily       This is a list of the screening recommended for you and due dates:  Health Maintenance  Topic Date Due  . Tetanus Vaccine  07/09/1970  . Cologuard (Stool DNA test)  07/09/2001  . Complete foot exam   02/25/2018  . Mammogram  12/06/2018  . Eye exam for diabetics  01/19/2019  . Flu Shot  02/26/2019  . Hemoglobin A1C  04/24/2019  . DEXA scan (bone density measurement)  Completed  .  Hepatitis C: One time screening is recommended by Center for Disease Control  (CDC) for  adults born from [birth year] through 1965.   Completed  . Pneumonia vaccines  Completed

## 2019-02-10 DIAGNOSIS — I498 Other specified cardiac arrhythmias: Secondary | ICD-10-CM | POA: Diagnosis not present

## 2019-02-10 DIAGNOSIS — I10 Essential (primary) hypertension: Secondary | ICD-10-CM | POA: Diagnosis not present

## 2019-02-10 DIAGNOSIS — I071 Rheumatic tricuspid insufficiency: Secondary | ICD-10-CM | POA: Diagnosis not present

## 2019-02-10 DIAGNOSIS — E119 Type 2 diabetes mellitus without complications: Secondary | ICD-10-CM | POA: Diagnosis not present

## 2019-03-07 ENCOUNTER — Other Ambulatory Visit (INDEPENDENT_AMBULATORY_CARE_PROVIDER_SITE_OTHER): Payer: Medicare Other

## 2019-03-07 ENCOUNTER — Other Ambulatory Visit: Payer: Self-pay

## 2019-03-07 DIAGNOSIS — E119 Type 2 diabetes mellitus without complications: Secondary | ICD-10-CM

## 2019-03-07 DIAGNOSIS — I10 Essential (primary) hypertension: Secondary | ICD-10-CM | POA: Diagnosis not present

## 2019-03-07 DIAGNOSIS — E039 Hypothyroidism, unspecified: Secondary | ICD-10-CM | POA: Diagnosis not present

## 2019-03-07 LAB — LIPID PANEL
Cholesterol: 141 mg/dL (ref 0–200)
HDL: 82.9 mg/dL (ref >39.00)
LDL Cholesterol: 45 mg/dL (ref 0–99)
NonHDL: 58.26
Total CHOL/HDL Ratio: 2
Triglycerides: 64 mg/dL (ref 0.0–149.0)
VLDL: 12.8 mg/dL (ref 0.0–40.0)

## 2019-03-07 LAB — BASIC METABOLIC PANEL
BUN: 9 mg/dL (ref 6–23)
CO2: 28 mEq/L (ref 19–32)
Calcium: 9.9 mg/dL (ref 8.4–10.5)
Chloride: 98 mEq/L (ref 96–112)
Creatinine, Ser: 0.87 mg/dL (ref 0.40–1.20)
GFR: 64.82 mL/min (ref >60.00)
Glucose, Bld: 108 mg/dL — ABNORMAL HIGH (ref 70–99)
Potassium: 4.3 mEq/L (ref 3.5–5.1)
Sodium: 134 mEq/L — ABNORMAL LOW (ref 135–145)

## 2019-03-07 LAB — HEPATIC FUNCTION PANEL
ALT: 23 U/L (ref 0–35)
AST: 28 U/L (ref 0–37)
Albumin: 4.8 g/dL (ref 3.5–5.2)
Alkaline Phosphatase: 54 U/L (ref 39–117)
Bilirubin, Direct: 0.1 mg/dL (ref 0.0–0.3)
Total Bilirubin: 0.7 mg/dL (ref 0.2–1.2)
Total Protein: 6.8 g/dL (ref 6.0–8.3)

## 2019-03-07 LAB — TSH: TSH: 0.92 u[IU]/mL (ref 0.35–4.50)

## 2019-03-07 LAB — HEMOGLOBIN A1C: Hgb A1c MFr Bld: 6.5 % (ref 4.6–6.5)

## 2019-03-08 ENCOUNTER — Encounter: Payer: Self-pay | Admitting: Internal Medicine

## 2019-03-10 ENCOUNTER — Other Ambulatory Visit: Payer: Self-pay

## 2019-03-14 ENCOUNTER — Ambulatory Visit (INDEPENDENT_AMBULATORY_CARE_PROVIDER_SITE_OTHER): Payer: Medicare Other | Admitting: Internal Medicine

## 2019-03-14 ENCOUNTER — Other Ambulatory Visit: Payer: Self-pay

## 2019-03-14 ENCOUNTER — Ambulatory Visit: Payer: Medicare Other

## 2019-03-14 VITALS — BP 122/66 | HR 64 | Temp 97.2°F | Resp 16 | Ht 64.5 in | Wt 121.8 lb

## 2019-03-14 DIAGNOSIS — Z1231 Encounter for screening mammogram for malignant neoplasm of breast: Secondary | ICD-10-CM

## 2019-03-14 DIAGNOSIS — E119 Type 2 diabetes mellitus without complications: Secondary | ICD-10-CM | POA: Diagnosis not present

## 2019-03-14 DIAGNOSIS — I10 Essential (primary) hypertension: Secondary | ICD-10-CM

## 2019-03-14 DIAGNOSIS — Z9109 Other allergy status, other than to drugs and biological substances: Secondary | ICD-10-CM | POA: Diagnosis not present

## 2019-03-14 DIAGNOSIS — Z0000 Encounter for general adult medical examination without abnormal findings: Secondary | ICD-10-CM

## 2019-03-14 DIAGNOSIS — E039 Hypothyroidism, unspecified: Secondary | ICD-10-CM | POA: Diagnosis not present

## 2019-03-14 MED ORDER — LOSARTAN POTASSIUM 25 MG PO TABS: 12.5000 mg | ORAL_TABLET | Freq: Every day | ORAL | 1 refills | Status: DC

## 2019-03-14 MED ORDER — METFORMIN HCL ER (MOD) 1000 MG PO TB24: 1000.0000 mg | ORAL_TABLET | Freq: Two times a day (BID) | ORAL | 3 refills | Status: DC

## 2019-03-14 MED ORDER — GLUCOSE BLOOD VI STRP: ORAL_STRIP | 12 refills | Status: DC

## 2019-03-14 NOTE — Progress Notes (Signed)
Patient ID: Donna Howell, female   DOB: 1951-02-17, 68 y.o.   MRN: [id number]   Subjective:    Patient ID: Donna Howell, female    DOB: [birth year], 68 y.o.   MRN: [id number]  HPI  Patient with past history of diabetes, GERD and hypothyroidism.  She comes in today to follow up on these issues as well as for a complete physical exam.  She reports she is doing relatively well.  Increased stress.  Her mother is living with her and she has a special needs son.  Increased stress with covid. Also increased stress with her daughter's situation.   Discussed with her today.  She does not feel she needs anything more at this time.  Overall she feels she is handling things relatively well.  Stays active.  Exercises.  No chest pain.  No sob. No acid reflux.  No abdominal pain.  Bowels moving.  Saw cardiology in follow up 02/10/19.  Holter monitor with occasional PACs and PVCs.   Cardiac MRI no evidence of structural heart disease other than mitral and tricuspid valve prolapse.  On metoprolol.  Eating well.  Discussed lab results.     Past Medical History:  Diagnosis Date  . Arthritis   . Chicken pox   . Diabetes ([place])   . Frequent headaches   . GERD (gastroesophageal reflux disease)   . Hay fever   . Heart murmur   . High blood pressure   . History of bronchitis   . Hypothyroidism    Past Surgical History:  Procedure Laterality Date  . APPENDECTOMY  1974  . TONSILLECTOMY AND ADENOIDECTOMY  1962   Family History  Problem Relation Age of Onset  . Arthritis Mother   . Heart disease Mother   . Diabetes Mother   . Stroke Father   . Arthritis Sister   . Breast cancer Sister 47  . Alcoholism Brother   . Arthritis Maternal Grandmother   . Heart disease Maternal Grandmother   . Diabetes Maternal Grandmother    Social History   Socioeconomic History  . Marital status: Married    Spouse name: Not on file  . Number of children: Not on file  . Years of education: Not on file  . Highest  education level: Not on file  Occupational History  . Not on file  Social Needs  . Financial resource strain: Not hard at all  . Food insecurity    Worry: Never true    Inability: Never true  . Transportation needs    Medical: No    Non-medical: No  Tobacco Use  . Smoking status: Never Smoker  . Smokeless tobacco: Never Used  Substance and Sexual Activity  . Alcohol use: No  . Drug use: No  . Sexual activity: Not on file  Lifestyle  . Physical activity    Days per week: 7 days    Minutes per session: 90 min  . Stress: Not at all  Relationships  . Social Herbalist on phone: Not on file    Gets together: Not on file    Attends religious service: Not on file    Active member of club or organization: Not on file    Attends meetings of clubs or organizations: Not on file    Relationship status: Not on file  Other Topics Concern  . Not on file  Social History Narrative  . Not on file    Outpatient Encounter Medications as of 03/14/2019  Medication  Sig  . metoprolol succinate (TOPROL-XL) 25 MG 24 hr tablet Take by mouth. Take 1 tablet by mouth in the morning and 1/2 tablet in the evening  . eszopiclone (LUNESTA) 2 MG TABS tablet Take 1 tablet (2 mg total) by mouth at bedtime. Take immediately before bedtime  . glucose blood test strip Use as instructed to check blood sugars daily. DX E11.9  . levothyroxine (SYNTHROID, LEVOTHROID) 50 MCG tablet TAKE 1 TABLET DAILY  . losartan (COZAAR) 25 MG tablet Take 0.5 tablets (12.5 mg total) by mouth daily.  . metFORMIN (GLUMETZA) 1000 MG (MOD) 24 hr tablet Take 1 tablet (1,000 mg total) by mouth 2 (two) times daily with a meal.  . rosuvastatin (CRESTOR) 20 MG tablet TAKE 1 TABLET DAILY  . [DISCONTINUED] losartan (COZAAR) 25 MG tablet Take 1 tablet (25 mg total) by mouth daily.  . [DISCONTINUED] metFORMIN (GLUMETZA) 1000 MG (MOD) 24 hr tablet Take 1 tablet (1,000 mg total) by mouth 2 (two) times daily with a meal.  .  [DISCONTINUED] metoprolol succinate (TOPROL XL) 25 MG 24 hr tablet TAKE ONE-HALF (1/2) TABLET TWICE A DAY  . [DISCONTINUED] ONE TOUCH ULTRA TEST test strip Check blood sugar tid   No facility-administered encounter medications on file as of 03/14/2019.     Review of Systems  Constitutional: Negative for appetite change and unexpected weight change.  HENT: Negative for congestion and sinus pressure.   Eyes: Negative for pain and visual disturbance.  Respiratory: Negative for cough, chest tightness and shortness of breath.   Cardiovascular: Negative for chest pain, palpitations and leg swelling.  Gastrointestinal: Negative for abdominal pain, diarrhea, nausea and vomiting.  Genitourinary: Negative for difficulty urinating and dysuria.  Musculoskeletal: Negative for joint swelling and myalgias.  Skin: Negative for color change and rash.  Neurological: Negative for dizziness, light-headedness and headaches.  Hematological: Negative for adenopathy. Does not bruise/bleed easily.  Psychiatric/Behavioral: Negative for agitation and dysphoric mood.       Increased stress.         Objective:    Physical Exam Constitutional:      General: She is not in acute distress.    Appearance: Normal appearance. She is well-developed.  HENT:     Right Ear: External ear normal.     Left Ear: External ear normal.  Eyes:     General: No scleral icterus.       Right eye: No discharge.        Left eye: No discharge.     Conjunctiva/sclera: Conjunctivae normal.  Neck:     Musculoskeletal: Neck supple. No muscular tenderness.     Thyroid: No thyromegaly.  Cardiovascular:     Rate and Rhythm: Normal rate and regular rhythm.  Pulmonary:     Effort: No tachypnea, accessory muscle usage or respiratory distress.     Breath sounds: Normal breath sounds. No decreased breath sounds or wheezing.  Chest:     Breasts:        Right: No inverted nipple, mass, nipple discharge or tenderness (no axillary  adenopathy).        Left: No inverted nipple, mass, nipple discharge or tenderness (no axilarry adenopathy).  Abdominal:     General: Bowel sounds are normal.     Palpations: Abdomen is soft.     Tenderness: There is no abdominal tenderness.  Musculoskeletal:        General: No swelling or tenderness.  Lymphadenopathy:     Cervical: No cervical adenopathy.  Skin:  Findings: No erythema or rash.  Neurological:     Mental Status: She is alert and oriented to person, place, and time.  Psychiatric:        Mood and Affect: Mood normal.        Behavior: Behavior normal.     BP 122/66   Pulse 64   Temp (!) 97.2 F (36.2 C) (Temporal)   Resp 16   Ht 5' 4.5" (1.638 m)   Wt 121 lb 12.8 oz (55.2 kg)   SpO2 97%   BMI 20.58 kg/m  Wt Readings from Last 3 Encounters:  03/14/19 121 lb 12.8 oz (55.2 kg)  10/26/18 123 lb (55.8 kg)  06/23/18 121 lb 9.6 oz (55.2 kg)     Lab Results  Component Value Date   WBC 5.0 10/22/2018   HGB 13.3 10/22/2018   HCT 38.5 10/22/2018   PLT 256.0 10/22/2018   GLUCOSE 108 (H) 03/07/2019   CHOL 141 03/07/2019   TRIG 64.0 03/07/2019   HDL 82.90 03/07/2019   LDLCALC 45 03/07/2019   ALT 23 03/07/2019   AST 28 03/07/2019   NA 134 (L) 03/07/2019   K 4.3 03/07/2019   CL 98 03/07/2019   CREATININE 0.87 03/07/2019   BUN 9 03/07/2019   CO2 28 03/07/2019   TSH 0.92 03/07/2019   HGBA1C 6.5 03/07/2019   MICROALBUR 0.7 06/21/2018    Mr Shoulder Left Wo Contrast  Result Date: 02/01/2019 CLINICAL DATA:  Chronic left shoulder pain. Decreased range of motion. EXAM: MRI OF THE LEFT SHOULDER WITHOUT CONTRAST TECHNIQUE: Multiplanar, multisequence MR imaging of the shoulder was performed. No intravenous contrast was administered. COMPARISON:  None. FINDINGS: Rotator cuff: There is tendinopathy of the distal infraspinatus, supraspinatus and subscapularis tendons without discrete tears. Muscles: No atrophy or abnormal signal of the muscles of the rotator cuff.  Biceps long head: Properly located. Intrinsic degeneration of long head of the biceps tendon at the level of the bicipital groove. Degenerative changes of the adjacent lesser tuberosity. Acromioclavicular Joint: No arthropathy. Small effusion. Type 2 acromion. Slight inflammation of the subacromial/subdeltoid bursae. Glenohumeral Joint: Moderate glenohumeral joint effusion. No chondral defect. Labrum:  Intact. Bones: Focal degenerative changes of the greater and lesser tuberosities of the proximal humerus. Other: None IMPRESSION: 1. Tendinopathy of the rotator cuff without discrete rotator cuff tears. 2. Degenerative changes of the long head of the biceps tendon. 3. Glenohumeral joint effusions suggesting synovitis. 4. Subtle inflammation of the subacromial/subdeltoid bursae. Electronically Signed   By: Lorriane Shire M.D.   On: 02/01/2019 08:27       Assessment & Plan:   Problem List Items Addressed This Visit    Diabetes mellitus without complication ([place])    She watches her diet.  Exercises regularly.  a1c just checked 6.5.  Follow met b and a1c.        Relevant Medications   metFORMIN (GLUMETZA) 1000 MG (MOD) 24 hr tablet   losartan (COZAAR) 25 MG tablet   Other Relevant Orders   Hemoglobin A1c   Hepatic function panel   Lipid panel   Basic metabolic panel   Microalbumin / creatinine urine ratio   Environmental allergies    Stable       Healthcare maintenance    Physical today 03/14/19.  Mammogram scheduled for 04/14/19.  Has cologuard.  She plans to return.        Hypertension, essential    Blood pressure under good control.  Continue same medication regimen.  Follow pressures.  Follow metabolic panel.        Relevant Medications   metoprolol succinate (TOPROL-XL) 25 MG 24 hr tablet   losartan (COZAAR) 25 MG tablet   Hypothyroidism    On thyroid replacement.  Follow tsh.        Relevant Medications   metoprolol succinate (TOPROL-XL) 25 MG 24 hr tablet    Other Visit  Diagnoses    Visit for screening mammogram    -  Primary   Relevant Orders   MM 3D SCREEN BREAST BILATERAL       Einar Pheasant, MD

## 2019-03-19 ENCOUNTER — Encounter: Payer: Self-pay | Admitting: Internal Medicine

## 2019-03-19 NOTE — Assessment & Plan Note (Signed)
She watches her diet.  Exercises regularly.  a1c just checked 6.5.  Follow met b and a1c.

## 2019-03-19 NOTE — Assessment & Plan Note (Signed)
On thyroid replacement.  Follow tsh.  

## 2019-03-19 NOTE — Assessment & Plan Note (Signed)
Stable

## 2019-03-19 NOTE — Assessment & Plan Note (Signed)
Physical today 03/14/19.  Mammogram scheduled for 04/14/19.  Has cologuard.  She plans to return.

## 2019-03-19 NOTE — Assessment & Plan Note (Signed)
Blood pressure under good control.  Continue same medication regimen.  Follow pressures.  Follow metabolic panel.   

## 2019-03-31 ENCOUNTER — Encounter: Payer: Self-pay | Admitting: Internal Medicine

## 2019-04-01 NOTE — Telephone Encounter (Signed)
Please confirm with pharmacy what is needed for her to get her test strips.  Her sodium level has been 134-135.  Ok.  We will follow.  No need to change anything at this time.

## 2019-04-08 NOTE — Telephone Encounter (Signed)
Nothing was needed from [place]. Patient was able to get her test strips.

## 2019-04-14 ENCOUNTER — Encounter: Payer: Medicare Other | Admitting: Internal Medicine

## 2019-05-30 ENCOUNTER — Ambulatory Visit
Admission: RE | Admit: 2019-05-30 | Discharge: 2019-05-30 | Disposition: A | Discharge status: Home / Self Care | Payer: Medicare Other | Source: Ambulatory Visit | Attending: Internal Medicine | Admitting: Internal Medicine

## 2019-05-30 DIAGNOSIS — Z1231 Encounter for screening mammogram for malignant neoplasm of breast: Secondary | ICD-10-CM | POA: Diagnosis not present

## 2019-06-04 ENCOUNTER — Encounter: Payer: Self-pay | Admitting: Internal Medicine

## 2019-06-08 NOTE — Telephone Encounter (Signed)
BP has been running around 130/85 taking the 1/2 tablet of Losartan, which is higher than what she likes to see. She is not having any symptoms. She increased back to 1 whole tablet of Losartan. She increased back to a whole tablet yesterday. Has felt fine since. Advised she should monitor and record her blood pressure since she changed her dose. Patient did note that they have another family member hospitalized with COVID and she has been under some increased stress taking care of her mom and son and doing classes. Pt just wanted you to be aware and make sure that you were okay with staying on the whole tablet before sending in new script.

## 2019-06-08 NOTE — Telephone Encounter (Signed)
I am ok with her remaining on the whole tablet.  She will need to monitor her blood pressure.  Let us know if any problems.  Ok to send in prescription if needs - for whole tablet.

## 2019-06-21 ENCOUNTER — Other Ambulatory Visit: Payer: Self-pay

## 2019-06-25 ENCOUNTER — Encounter: Payer: Self-pay | Admitting: Internal Medicine

## 2019-06-27 ENCOUNTER — Other Ambulatory Visit: Payer: Self-pay | Admitting: Internal Medicine

## 2019-06-27 MED ORDER — ESZOPICLONE 2 MG PO TABS: 2.0000 mg | ORAL_TABLET | Freq: Every day | ORAL | 2 refills | Status: DC

## 2019-06-27 NOTE — Telephone Encounter (Signed)
Requested medication (s) are due for refill today: yes  Requested medication (s) are on the active medication list: yes  Last refill:  01/25/2019  Future visit scheduled: yes  Notes to clinic: refill cannot be delegated  Patient has one pill left   Requested Prescriptions  Pending Prescriptions Disp Refills   eszopiclone (LUNESTA) 2 MG TABS tablet 30 tablet 2    Sig: Take 1 tablet (2 mg total) by mouth at bedtime. Take immediately before bedtime     Not Delegated - Psychiatry:  Anxiolytics/Hypnotics Failed - 06/27/2019 10:37 AM      Failed - This refill cannot be delegated      Failed - Urine Drug Screen completed in last 360 days.      Passed - Valid encounter within last 6 months    Recent Outpatient Visits          3 months ago Visit for screening mammogram   [facility] [place], [place], MD   8 months ago Need for 23-polyvalent pneumococcal polysaccharide vaccine   [facility], [place] Patient, MD   1 year ago Diabetes mellitus without complication [facility])   [facility] [place], [place] Patient, MD   1 year ago Cough   [facility] [place], [place] Patient, MD   1 year ago Chest pain, unspecified type   [facility], MD      Future Appointments            In 2 weeks Einar Pheasant, MD [facility], [place]   In 7 months O'Brien-Blaney, Bryson Corona, LPN [place], [place]

## 2019-06-27 NOTE — Telephone Encounter (Signed)
Copied from [facility] [id number]. Topic: Quick Communication - Rx Refill/Question >> Jun 27, 2019 10:19 AM Leward Quan A wrote: Medication: eszopiclone (LUNESTA) 2 MG TABS tablet   Per patient she only have one pill left please advise   Has the patient contacted their pharmacy? Yes.   (Agent: If no, request that the patient contact the pharmacy for the refill.) (Agent: If yes, when and what did the pharmacy advise?)  Preferred Pharmacy (with phone number or street name): [id number] - [place], [place] - [address] [phone] (Phone) [phone] (Fax)    Agent: Please be advised that RX refills may take up to 3 business days. We ask that you follow-up with your pharmacy.

## 2019-06-27 NOTE — Telephone Encounter (Signed)
rx sent in for lunesta #30 with 2 refills.   

## 2019-06-27 NOTE — Telephone Encounter (Signed)
Last OV:  03/14/19  Last Refill:  01/25/19

## 2019-06-28 ENCOUNTER — Other Ambulatory Visit: Payer: Self-pay

## 2019-07-08 ENCOUNTER — Other Ambulatory Visit (INDEPENDENT_AMBULATORY_CARE_PROVIDER_SITE_OTHER): Payer: Medicare Other

## 2019-07-08 ENCOUNTER — Other Ambulatory Visit: Payer: Self-pay

## 2019-07-08 DIAGNOSIS — E119 Type 2 diabetes mellitus without complications: Secondary | ICD-10-CM | POA: Diagnosis not present

## 2019-07-08 LAB — HEPATIC FUNCTION PANEL
ALT: 24 U/L (ref 0–35)
AST: 28 U/L (ref 0–37)
Albumin: 4.6 g/dL (ref 3.5–5.2)
Alkaline Phosphatase: 51 U/L (ref 39–117)
Bilirubin, Direct: 0.2 mg/dL (ref 0.0–0.3)
Total Bilirubin: 0.7 mg/dL (ref 0.2–1.2)
Total Protein: 6.9 g/dL (ref 6.0–8.3)

## 2019-07-08 LAB — LIPID PANEL
Cholesterol: 143 mg/dL (ref 0–200)
HDL: 82.9 mg/dL (ref >39.00)
LDL Cholesterol: 46 mg/dL (ref 0–99)
NonHDL: 59.71
Total CHOL/HDL Ratio: 2
Triglycerides: 68 mg/dL (ref 0.0–149.0)
VLDL: 13.6 mg/dL (ref 0.0–40.0)

## 2019-07-08 LAB — MICROALBUMIN / CREATININE URINE RATIO
Creatinine,U: 59.3 mg/dL
Microalb Creat Ratio: 1.2 mg/g (ref 0.0–30.0)
Microalb, Ur: <0.7 mg/dL (ref 0.0–1.9)

## 2019-07-08 LAB — BASIC METABOLIC PANEL
BUN: 11 mg/dL (ref 6–23)
CO2: 29 mEq/L (ref 19–32)
Calcium: 9.7 mg/dL (ref 8.4–10.5)
Chloride: 98 mEq/L (ref 96–112)
Creatinine, Ser: 0.88 mg/dL (ref 0.40–1.20)
GFR: 63.9 mL/min (ref >60.00)
Glucose, Bld: 106 mg/dL — ABNORMAL HIGH (ref 70–99)
Potassium: 4.3 mEq/L (ref 3.5–5.1)
Sodium: 134 mEq/L — ABNORMAL LOW (ref 135–145)

## 2019-07-08 LAB — HEMOGLOBIN A1C: Hgb A1c MFr Bld: 6.5 % (ref 4.6–6.5)

## 2019-07-11 ENCOUNTER — Encounter: Payer: Self-pay | Admitting: Internal Medicine

## 2019-07-12 ENCOUNTER — Encounter: Payer: Self-pay | Admitting: Internal Medicine

## 2019-07-12 ENCOUNTER — Other Ambulatory Visit: Payer: Self-pay

## 2019-07-12 ENCOUNTER — Ambulatory Visit (INDEPENDENT_AMBULATORY_CARE_PROVIDER_SITE_OTHER): Payer: Medicare Other | Admitting: Internal Medicine

## 2019-07-12 DIAGNOSIS — I10 Essential (primary) hypertension: Secondary | ICD-10-CM | POA: Diagnosis not present

## 2019-07-12 DIAGNOSIS — E7800 Pure hypercholesterolemia, unspecified: Secondary | ICD-10-CM

## 2019-07-12 DIAGNOSIS — Z1211 Encounter for screening for malignant neoplasm of colon: Secondary | ICD-10-CM

## 2019-07-12 DIAGNOSIS — E039 Hypothyroidism, unspecified: Secondary | ICD-10-CM

## 2019-07-12 DIAGNOSIS — E119 Type 2 diabetes mellitus without complications: Secondary | ICD-10-CM

## 2019-07-12 DIAGNOSIS — F439 Reaction to severe stress, unspecified: Secondary | ICD-10-CM

## 2019-07-12 DIAGNOSIS — G479 Sleep disorder, unspecified: Secondary | ICD-10-CM

## 2019-07-12 NOTE — Progress Notes (Signed)
Patient ID: Donna Howell, female   DOB: [birth year], 68 y.o.   MRN: [id number]   Virtual Visit via video Note  This visit type was conducted due to national recommendations for restrictions regarding the COVID-19 pandemic (e.g. social distancing).  This format is felt to be most appropriate for this patient at this time.  All issues noted in this document were discussed and addressed.  No physical exam was performed (except for noted visual exam findings with Video Visits).   I connected with Donna Howell by a video enabled telemedicine application and verified that I am speaking with the correct person using two identifiers. Location patient: home Location provider: work Persons participating in the virtual visit: patient, provider  The limitations, risks, security and privacy concerns of performing an evaluation and management service by video and the availability of in person appointments have been discussed.  The patient expressed understanding and agreed to proceed.   Reason for visit: scheduled follow up.    HPI: She reports she is doing relatively well. Increased stress with her family issues.  Also recent death in the family due to covid.  Discussed with her.  She does not feel she needs any further intervention.  Is very active.  No chest pain or sob reported with increased activity or exertion.  Walks a lot.  Eating.  Watches her diet.  Adheres to low sodium diet.  Has had some issues with losartan.  1/2 tablet - not enough to control blood pressure.  Feels a whole tablet is too much.  Is now taking 1/2 tablet bid and doing ok with this dosing regimen.  Blood pressure 110/68.  She has noticed problems with her legs aching.  Aching in her upper thighs and into her lower legs.  Discussed possibility of crestor contributing.  Discussed a trial off to see if symptoms improve.  Discussed labs.     ROS: See pertinent positives and negatives per HPI.  Past Medical History:  Diagnosis  Date  . Arthritis   . Chicken pox   . Diabetes ([place])   . Frequent headaches   . GERD (gastroesophageal reflux disease)   . Hay fever   . Heart murmur   . High blood pressure   . History of bronchitis   . Hypothyroidism     Past Surgical History:  Procedure Laterality Date  . APPENDECTOMY  1974  . TONSILLECTOMY AND ADENOIDECTOMY  1962    Family History  Problem Relation Age of Onset  . Arthritis Mother   . Heart disease Mother   . Diabetes Mother   . Stroke Father   . Arthritis Sister   . Breast cancer Sister 4  . Alcoholism Brother   . Arthritis Maternal Grandmother   . Heart disease Maternal Grandmother   . Diabetes Maternal Grandmother     SOCIAL HX: reviewed.    Current Outpatient Medications:  .  eszopiclone (LUNESTA) 2 MG TABS tablet, Take 1 tablet (2 mg total) by mouth at bedtime. Take immediately before bedtime, Disp: 30 tablet, Rfl: 2 .  glucose blood test strip, Use as instructed to check blood sugars daily. DX E11.9, Disp: 100 each, Rfl: 12 .  levothyroxine (SYNTHROID, LEVOTHROID) 50 MCG tablet, TAKE 1 TABLET DAILY, Disp: 90 tablet, Rfl: 4 .  losartan (COZAAR) 25 MG tablet, Take 0.5 tablets (12.5 mg total) by mouth daily., Disp: 45 tablet, Rfl: 1 .  metFORMIN (GLUMETZA) 1000 MG (MOD) 24 hr tablet, Take 1 tablet (1,000 mg total) by mouth 2 (  two) times daily with a meal., Disp: 180 tablet, Rfl: 3 .  metoprolol succinate (TOPROL-XL) 25 MG 24 hr tablet, TAKE ONE-HALF (1/2) TABLET TWICE A DAY, Disp: 90 tablet, Rfl: 3 .  rosuvastatin (CRESTOR) 20 MG tablet, TAKE 1 TABLET DAILY, Disp: 90 tablet, Rfl: 4  EXAM:  VITALS per patient if applicable: 661/96  GENERAL: alert, oriented, appears well and in no acute distress  HEENT: atraumatic, conjunttiva clear, no obvious abnormalities on inspection of external nose and ears  NECK: normal movements of the head and neck  LUNGS: on inspection no signs of respiratory distress, breathing rate appears normal, no obvious  gross SOB, gasping or wheezing  CV: no obvious cyanosis  PSYCH/NEURO: pleasant and cooperative, no obvious depression or anxiety, speech and thought processing grossly intact  ASSESSMENT AND PLAN:  Discussed the following assessment and plan:  Diabetes mellitus without complication ([place]) Exercises regularly.  Watches her diet.  Recent a1c 6.5.  Follow met b and a1c.   Difficulty sleeping On lunesta.   Hypertension, essential Doing better on losartan bid.  Follow pressures.  Follow metabolic panel.    Hypothyroidism On thyroid replacement.  Follow tsh.   Hypercholesterolemia On crestor for risk factor modification.  Has noticed leg aching.  Will hold crestor.  Follow symptoms.  Call with update over the next couple of weeks.  Follow lipid panel.   Colon cancer screening Has cologuard kit.  Plans to send in.    Stress Increased stress as outlined.  Does not feel she needs any further intervention.  Follow.      I discussed the assessment and treatment plan with the patient. The patient was provided an opportunity to ask questions and all were answered. The patient agreed with the plan and demonstrated an understanding of the instructions.   The patient was advised to call back or seek an in-person evaluation if the symptoms worsen or if the condition fails to improve as anticipated.   Einar Pheasant, MD

## 2019-07-14 ENCOUNTER — Encounter: Payer: Self-pay | Admitting: Internal Medicine

## 2019-07-14 ENCOUNTER — Other Ambulatory Visit: Payer: Self-pay | Admitting: Internal Medicine

## 2019-07-16 ENCOUNTER — Encounter: Payer: Self-pay | Admitting: Internal Medicine

## 2019-07-16 DIAGNOSIS — F439 Reaction to severe stress, unspecified: Secondary | ICD-10-CM | POA: Insufficient documentation

## 2019-07-16 DIAGNOSIS — E7800 Pure hypercholesterolemia, unspecified: Secondary | ICD-10-CM | POA: Insufficient documentation

## 2019-07-16 NOTE — Assessment & Plan Note (Signed)
Increased stress as outlined.  Does not feel she needs any further intervention.  Follow.   

## 2019-07-16 NOTE — Assessment & Plan Note (Signed)
Doing better on losartan bid.  Follow pressures.  Follow metabolic panel.

## 2019-07-16 NOTE — Assessment & Plan Note (Signed)
On thyroid replacement.  Follow tsh.  

## 2019-07-16 NOTE — Assessment & Plan Note (Signed)
Has cologuard kit.  Plans to send in.

## 2019-07-16 NOTE — Assessment & Plan Note (Signed)
On lunesta.   

## 2019-07-16 NOTE — Assessment & Plan Note (Signed)
On crestor for risk factor modification.  Has noticed leg aching.  Will hold crestor.  Follow symptoms.  Call with update over the next couple of weeks.  Follow lipid panel.

## 2019-07-16 NOTE — Assessment & Plan Note (Signed)
Exercises regularly.  Watches her diet.  Recent a1c 6.5.  Follow met b and a1c.

## 2019-08-05 ENCOUNTER — Other Ambulatory Visit: Payer: Self-pay | Admitting: Internal Medicine

## 2019-09-06 ENCOUNTER — Other Ambulatory Visit: Payer: Self-pay | Admitting: Internal Medicine

## 2019-09-09 DIAGNOSIS — Z23 Encounter for immunization: Secondary | ICD-10-CM | POA: Diagnosis not present

## 2019-09-19 ENCOUNTER — Other Ambulatory Visit: Payer: Self-pay | Admitting: Internal Medicine

## 2019-09-22 ENCOUNTER — Encounter: Payer: Self-pay | Admitting: Internal Medicine

## 2019-09-23 ENCOUNTER — Encounter: Payer: Self-pay | Admitting: Internal Medicine

## 2019-09-23 MED ORDER — ESZOPICLONE 2 MG PO TABS: 2.0000 mg | ORAL_TABLET | Freq: Every day | ORAL | 2 refills | Status: DC

## 2019-09-23 NOTE — Telephone Encounter (Signed)
rx ok'd for lunesta #30 with 2 refills.   

## 2019-09-23 NOTE — Telephone Encounter (Signed)
Refilled: 06/27/2019 Last OV: 07/12/2019 Next OV: 11/15/2019

## 2019-10-07 DIAGNOSIS — Z23 Encounter for immunization: Secondary | ICD-10-CM | POA: Diagnosis not present

## 2019-10-20 DIAGNOSIS — I10 Essential (primary) hypertension: Secondary | ICD-10-CM | POA: Diagnosis not present

## 2019-10-20 DIAGNOSIS — I071 Rheumatic tricuspid insufficiency: Secondary | ICD-10-CM | POA: Diagnosis not present

## 2019-10-20 DIAGNOSIS — I498 Other specified cardiac arrhythmias: Secondary | ICD-10-CM | POA: Diagnosis not present

## 2019-10-20 DIAGNOSIS — E119 Type 2 diabetes mellitus without complications: Secondary | ICD-10-CM | POA: Diagnosis not present

## 2019-10-29 ENCOUNTER — Encounter: Payer: Self-pay | Admitting: Internal Medicine

## 2019-10-31 ENCOUNTER — Other Ambulatory Visit: Payer: Self-pay

## 2019-10-31 MED ORDER — METFORMIN HCL ER (MOD) 1000 MG PO TB24: 1000.0000 mg | ORAL_TABLET | Freq: Two times a day (BID) | ORAL | 3 refills | Status: DC

## 2019-10-31 NOTE — Telephone Encounter (Signed)
Spoke with patient, advised that medication was still active on our med list. Confirmed taking 1000mg  bid. Sent in new rx.

## 2019-11-01 NOTE — Telephone Encounter (Signed)
Please resend Metformin to mail order

## 2019-11-01 NOTE — Telephone Encounter (Signed)
Please call and notify pt that insurance no longer covering.  Ok to change to Metformin extended release 500mg  - take 2 tablets 2x/day

## 2019-11-01 NOTE — Telephone Encounter (Signed)
Pt states that she called [facility] and they told her that PCP has to call [phone] to give clarification on Metformin. Pt is almost out of medication. Please advise.

## 2019-11-02 ENCOUNTER — Other Ambulatory Visit: Payer: Self-pay

## 2019-11-02 MED ORDER — METFORMIN HCL ER 500 MG PO TB24: 1000.0000 mg | ORAL_TABLET | Freq: Two times a day (BID) | ORAL | 3 refills | Status: DC

## 2019-11-02 NOTE — Telephone Encounter (Signed)
Pt aware Rx sent in  

## 2019-11-11 ENCOUNTER — Other Ambulatory Visit (INDEPENDENT_AMBULATORY_CARE_PROVIDER_SITE_OTHER): Payer: Medicare Other

## 2019-11-11 ENCOUNTER — Encounter: Payer: Self-pay | Admitting: Internal Medicine

## 2019-11-11 ENCOUNTER — Other Ambulatory Visit: Payer: Self-pay

## 2019-11-11 DIAGNOSIS — I10 Essential (primary) hypertension: Secondary | ICD-10-CM

## 2019-11-11 DIAGNOSIS — E119 Type 2 diabetes mellitus without complications: Secondary | ICD-10-CM | POA: Diagnosis not present

## 2019-11-11 DIAGNOSIS — E7800 Pure hypercholesterolemia, unspecified: Secondary | ICD-10-CM

## 2019-11-11 LAB — BASIC METABOLIC PANEL
BUN: 9 mg/dL (ref 6–23)
CO2: 29 mEq/L (ref 19–32)
Calcium: 9.3 mg/dL (ref 8.4–10.5)
Chloride: 98 mEq/L (ref 96–112)
Creatinine, Ser: 0.88 mg/dL (ref 0.40–1.20)
GFR: 63.84 mL/min (ref >60.00)
Glucose, Bld: 107 mg/dL — ABNORMAL HIGH (ref 70–99)
Potassium: 4.2 mEq/L (ref 3.5–5.1)
Sodium: 133 mEq/L — ABNORMAL LOW (ref 135–145)

## 2019-11-11 LAB — LIPID PANEL
Cholesterol: 137 mg/dL (ref 0–200)
HDL: 82.5 mg/dL (ref >39.00)
LDL Cholesterol: 41 mg/dL (ref 0–99)
NonHDL: 54.49
Total CHOL/HDL Ratio: 2
Triglycerides: 67 mg/dL (ref 0.0–149.0)
VLDL: 13.4 mg/dL (ref 0.0–40.0)

## 2019-11-11 LAB — CBC WITH DIFFERENTIAL/PLATELET
Basophils Absolute: 0 10*3/uL (ref 0.0–0.1)
Basophils Relative: 1 % (ref 0.0–3.0)
Eosinophils Absolute: 0.2 10*3/uL (ref 0.0–0.7)
Eosinophils Relative: 4.9 % (ref 0.0–5.0)
HCT: 36.6 % (ref 36.0–46.0)
Hemoglobin: 12.7 g/dL (ref 12.0–15.0)
Lymphocytes Relative: 43.4 % (ref 12.0–46.0)
Lymphs Abs: 2 10*3/uL (ref 0.7–4.0)
MCHC: 34.7 g/dL (ref 30.0–36.0)
MCV: 88.2 fl (ref 78.0–100.0)
Monocytes Absolute: 0.5 10*3/uL (ref 0.1–1.0)
Monocytes Relative: 11.4 % (ref 3.0–12.0)
Neutro Abs: 1.8 10*3/uL (ref 1.4–7.7)
Neutrophils Relative %: 39.3 % — ABNORMAL LOW (ref 43.0–77.0)
Platelets: 242 10*3/uL (ref 150.0–400.0)
RBC: 4.15 Mil/uL (ref 3.87–5.11)
RDW: 12.9 % (ref 11.5–15.5)
WBC: 4.5 10*3/uL (ref 4.0–10.5)

## 2019-11-11 LAB — HEPATIC FUNCTION PANEL
ALT: 21 U/L (ref 0–35)
AST: 27 U/L (ref 0–37)
Albumin: 4.5 g/dL (ref 3.5–5.2)
Alkaline Phosphatase: 49 U/L (ref 39–117)
Bilirubin, Direct: 0.1 mg/dL (ref 0.0–0.3)
Total Bilirubin: 0.6 mg/dL (ref 0.2–1.2)
Total Protein: 6.8 g/dL (ref 6.0–8.3)

## 2019-11-11 LAB — HEMOGLOBIN A1C: Hgb A1c MFr Bld: 6.3 % (ref 4.6–6.5)

## 2019-11-15 ENCOUNTER — Ambulatory Visit (INDEPENDENT_AMBULATORY_CARE_PROVIDER_SITE_OTHER): Payer: Medicare Other | Admitting: Internal Medicine

## 2019-11-15 ENCOUNTER — Encounter: Payer: Self-pay | Admitting: Internal Medicine

## 2019-11-15 ENCOUNTER — Other Ambulatory Visit: Payer: Self-pay

## 2019-11-15 DIAGNOSIS — I10 Essential (primary) hypertension: Secondary | ICD-10-CM

## 2019-11-15 DIAGNOSIS — F439 Reaction to severe stress, unspecified: Secondary | ICD-10-CM | POA: Diagnosis not present

## 2019-11-15 DIAGNOSIS — M5442 Lumbago with sciatica, left side: Secondary | ICD-10-CM | POA: Diagnosis not present

## 2019-11-15 DIAGNOSIS — E119 Type 2 diabetes mellitus without complications: Secondary | ICD-10-CM

## 2019-11-15 DIAGNOSIS — E7800 Pure hypercholesterolemia, unspecified: Secondary | ICD-10-CM

## 2019-11-15 DIAGNOSIS — E039 Hypothyroidism, unspecified: Secondary | ICD-10-CM

## 2019-11-15 MED ORDER — NAPROXEN 500 MG PO TABS: 500.0000 mg | ORAL_TABLET | Freq: Every day | ORAL | 0 refills | Status: DC | PRN

## 2019-11-15 MED ORDER — LOSARTAN POTASSIUM 25 MG PO TABS: 25.0000 mg | ORAL_TABLET | Freq: Every day | ORAL | 1 refills | Status: DC

## 2019-11-15 NOTE — Progress Notes (Signed)
Patient ID: Donna Howell, female   DOB: 12-Jun-1951, 69 y.o.   MRN: [id number]   Subjective:    Patient ID: Donna Howell, female    DOB: July 05, 1951, 69 y.o.   MRN: [id number]  HPI This visit occurred during the SARS-CoV-2 public health emergency.  Safety protocols were in place, including screening questions prior to the visit, additional usage of staff PPE, and extensive cleaning of exam room while observing appropriate contact time as indicated for disinfecting solutions.  Patient here for a scheduled follow up.  She reports she is doing relatively well.  Still with increased stress. Discussed with her today.  Overall she feels she is handling things relatively well.  Does not feel needs any further intervention.  No chest pain or sob reported.  No abdominal pain or bowel change reported.  Has seen cardiology.  ECHO - moderate TR with preserved LV function and mildly elevated right side hart pressure.  holter - SR with occasional PACs and PVCs.  No sustained arrhythmias.  Cardiac MRI with stress - no evidence of structural heart disease other than mitral and tricuspid valve prolapse.  LV and RV - normal. No changes made. Recommended f/u in 6 months.  Reports having some low back discomfort and reports hard to bend legs.  Still walks 5-8 miles per day.  No change with stopping statin medication.    Past Medical History:  Diagnosis Date  . Arthritis   . Chicken pox   . Diabetes ([place])   . Frequent headaches   . GERD (gastroesophageal reflux disease)   . Hay fever   . Heart murmur   . High blood pressure   . History of bronchitis   . Hypothyroidism    Past Surgical History:  Procedure Laterality Date  . APPENDECTOMY  1974  . TONSILLECTOMY AND ADENOIDECTOMY  1962   Family History  Problem Relation Age of Onset  . Arthritis Mother   . Heart disease Mother   . Diabetes Mother   . Stroke Father   . Arthritis Sister   . Breast cancer Sister 77  . Alcoholism Brother   . Arthritis  Maternal Grandmother   . Heart disease Maternal Grandmother   . Diabetes Maternal Grandmother    Social History   Socioeconomic History  . Marital status: Married    Spouse name: Not on file  . Number of children: Not on file  . Years of education: Not on file  . Highest education level: Not on file  Occupational History  . Not on file  Tobacco Use  . Smoking status: Never Smoker  . Smokeless tobacco: Never Used  Substance and Sexual Activity  . Alcohol use: No  . Drug use: No  . Sexual activity: Not on file  Other Topics Concern  . Not on file  Social History Narrative  . Not on file   Social Determinants of Health   Financial Resource Strain:   . Difficulty of Paying Living Expenses:   Food Insecurity:   . Worried About Charity fundraiser in the Last Year:   . Arboriculturist in the Last Year:   Transportation Needs:   . Film/video editor (Medical):   Marland Kitchen Lack of Transportation (Non-Medical):   Physical Activity:   . Days of Exercise per Week:   . Minutes of Exercise per Session:   Stress:   . Feeling of Stress :   Social Connections:   . Frequency of Communication with Friends and Family:   .  Frequency of Social Gatherings with Friends and Family:   . Attends Religious Services:   . Active Member of Clubs or Organizations:   . Attends Archivist Meetings:   Marland Kitchen Marital Status:     Outpatient Encounter Medications as of 11/15/2019  Medication Sig  . eszopiclone (LUNESTA) 2 MG TABS tablet Take 1 tablet (2 mg total) by mouth at bedtime. Take immediately before bedtime  . glucose blood test strip Use as instructed to check blood sugars daily. DX E11.9  . levothyroxine (SYNTHROID) 50 MCG tablet TAKE 1 TABLET DAILY  . losartan (COZAAR) 25 MG tablet Take 1 tablet (25 mg total) by mouth daily.  . metFORMIN (GLUCOPHAGE-XR) 500 MG 24 hr tablet Take 2 tablets (1,000 mg total) by mouth 2 (two) times daily with a meal.  . metoprolol succinate (TOPROL-XL) 25  MG 24 hr tablet TAKE ONE-HALF (1/2) TABLET TWICE A DAY  . naproxen (NAPROSYN) 500 MG tablet Take 1 tablet (500 mg total) by mouth daily as needed.  . rosuvastatin (CRESTOR) 20 MG tablet TAKE 1 TABLET DAILY  . [DISCONTINUED] losartan (COZAAR) 25 MG tablet TAKE ONE-HALF (1/2) TABLET DAILY   No facility-administered encounter medications on file as of 11/15/2019.    Review of Systems  Constitutional: Negative for appetite change and unexpected weight change.  HENT: Negative for congestion and sinus pressure.   Respiratory: Negative for cough, chest tightness and shortness of breath.   Cardiovascular: Negative for chest pain, palpitations and leg swelling.  Gastrointestinal: Negative for abdominal pain, diarrhea, nausea and vomiting.  Genitourinary: Negative for difficulty urinating and dysuria.  Musculoskeletal: Positive for back pain. Negative for joint swelling.       Leg pain as outlined.    Skin: Negative for color change and rash.  Neurological: Negative for dizziness, light-headedness and headaches.  Psychiatric/Behavioral: Negative for agitation and dysphoric mood.       Objective:    Physical Exam Constitutional:      General: She is not in acute distress.    Appearance: Normal appearance.  HENT:     Head: Normocephalic and atraumatic.     Right Ear: External ear normal.     Left Ear: External ear normal.  Eyes:     General:        Right eye: No discharge.        Left eye: No discharge.     Conjunctiva/sclera: Conjunctivae normal.  Neck:     Thyroid: No thyromegaly.  Cardiovascular:     Rate and Rhythm: Normal rate and regular rhythm.  Pulmonary:     Effort: No respiratory distress.     Breath sounds: Normal breath sounds. No wheezing.  Abdominal:     General: Bowel sounds are normal.     Palpations: Abdomen is soft.     Tenderness: There is no abdominal tenderness.  Musculoskeletal:        General: No swelling or tenderness.     Cervical back: Neck supple. No  tenderness.  Lymphadenopathy:     Cervical: No cervical adenopathy.  Skin:    Findings: No erythema or rash.  Neurological:     Mental Status: She is alert.  Psychiatric:        Mood and Affect: Mood normal.        Behavior: Behavior normal.     BP 118/70   Pulse 64   Temp (!) 96.8 F (36 C)   Resp 16   Ht 5' 5"  (1.651 m)   Wt  123 lb 12.8 oz (56.2 kg)   SpO2 98%   BMI 20.60 kg/m  Wt Readings from Last 3 Encounters:  11/15/19 123 lb 12.8 oz (56.2 kg)  07/12/19 120 lb 11.2 oz (54.7 kg)  03/14/19 121 lb 12.8 oz (55.2 kg)     Lab Results  Component Value Date   WBC 4.5 11/11/2019   HGB 12.7 11/11/2019   HCT 36.6 11/11/2019   PLT 242.0 11/11/2019   GLUCOSE 107 (H) 11/11/2019   CHOL 137 11/11/2019   TRIG 67.0 11/11/2019   HDL 82.50 11/11/2019   LDLCALC 41 11/11/2019   ALT 21 11/11/2019   AST 27 11/11/2019   NA 133 (L) 11/11/2019   K 4.2 11/11/2019   CL 98 11/11/2019   CREATININE 0.88 11/11/2019   BUN 9 11/11/2019   CO2 29 11/11/2019   TSH 0.92 03/07/2019   HGBA1C 6.3 11/11/2019   MICROALBUR <0.7 07/08/2019    MM 3D SCREEN BREAST BILATERAL  Result Date: 05/30/2019 CLINICAL DATA:  Screening. EXAM: DIGITAL SCREENING BILATERAL MAMMOGRAM WITH TOMO AND CAD COMPARISON:  Previous exam(s). ACR Breast Density Category c: The breast tissue is heterogeneously dense, which may obscure small masses. FINDINGS: There are no findings suspicious for malignancy. Images were processed with CAD. IMPRESSION: No mammographic evidence of malignancy. A result letter of this screening mammogram will be mailed directly to the patient. RECOMMENDATION: Screening mammogram in one year. (Code:SM-B-01Y) BI-RADS CATEGORY  1: Negative. Electronically Signed   By: Lovey Newcomer M.D.   On: 05/30/2019 12:51       Assessment & Plan:   Problem List Items Addressed This Visit    Back pain    Back pain as outlined.  Stable.  Continues to walk.  Wants to monitor.        Relevant Medications    naproxen (NAPROSYN) 500 MG tablet   Diabetes mellitus without complication (HCC)    Low carb diet and exercise - continues.  Follow met b and a1c.   Lab Results  Component Value Date   HGBA1C 6.3 11/11/2019        Relevant Medications   losartan (COZAAR) 25 MG tablet   Other Relevant Orders   Hemoglobin P9J   Basic metabolic panel   Hypercholesterolemia    On crestor.  Follow lipid panel and liver function tests        Relevant Medications   losartan (COZAAR) 25 MG tablet   Other Relevant Orders   Hepatic function panel   Lipid panel   Hypertension, essential    Blood pressure under good control.  Continue same medication regimen - metoprolol and losartan.   Follow pressures.  Follow metabolic panel.        Relevant Medications   losartan (COZAAR) 25 MG tablet   Other Relevant Orders   CBC with Differential/Platelet   Hypothyroidism    On thyroid replacement.  Follow tsh.       Relevant Orders   TSH   Stress    Increased stress - persistent.  Overall handling things relatively well.  Wants to follow.  Does not feel needs any further intervention.            Einar Pheasant, MD

## 2019-11-20 ENCOUNTER — Encounter: Payer: Self-pay | Admitting: Internal Medicine

## 2019-11-20 NOTE — Assessment & Plan Note (Signed)
Increased stress - persistent.  Overall handling things relatively well.  Wants to follow.  Does not feel needs any further intervention.

## 2019-11-20 NOTE — Assessment & Plan Note (Signed)
On thyroid replacement.  Follow tsh.  

## 2019-11-20 NOTE — Assessment & Plan Note (Signed)
On crestor.  Follow lipid panel and liver function tests.   

## 2019-11-20 NOTE — Assessment & Plan Note (Signed)
Back pain as outlined.  Stable.  Continues to walk.  Wants to monitor.

## 2019-11-20 NOTE — Assessment & Plan Note (Signed)
Low carb diet and exercise - continues.  Follow met b and a1c.   Lab Results  Component Value Date   HGBA1C 6.3 11/11/2019

## 2019-11-20 NOTE — Assessment & Plan Note (Signed)
Blood pressure under good control.  Continue same medication regimen - metoprolol and losartan.   Follow pressures.  Follow metabolic panel.

## 2019-12-18 ENCOUNTER — Encounter: Payer: Self-pay | Admitting: Internal Medicine

## 2019-12-19 MED ORDER — METOPROLOL SUCCINATE ER 25 MG PO TB24: ORAL_TABLET | ORAL | 3 refills | Status: DC

## 2019-12-19 NOTE — Telephone Encounter (Signed)
Received a call from [facility] and state the previous RX quantity needs to be a 90 day supply.   Was transferred to the pharmacist and gave the verbal to change quantity from 90 to 135.

## 2019-12-19 NOTE — Telephone Encounter (Signed)
Yes Ok to change.

## 2019-12-19 NOTE — Telephone Encounter (Signed)
Signature from 07/14/2019 script:  "Sig: TAKE ONE-HALF (1/2) TABLET TWICE A DAY"  Okay to change dose and send in to pharmacy?

## 2019-12-21 ENCOUNTER — Other Ambulatory Visit: Payer: Self-pay | Admitting: Internal Medicine

## 2019-12-21 NOTE — Telephone Encounter (Signed)
rx ok'd for lunesta #30 with 2 refills.   

## 2019-12-21 NOTE — Telephone Encounter (Signed)
Refill request for lunesta, last seen 11-15-19, last filled 09-23-19.  Please advise.

## 2020-01-09 ENCOUNTER — Encounter: Payer: Self-pay | Admitting: Internal Medicine

## 2020-01-09 NOTE — Telephone Encounter (Signed)
Can schedule her for met b and magnesium check - to confirm electrolytes are wnl.  Can schedule non fasting lab - for met b only.  Will need to let lab know only need met b drawn.  There are other future labs ordered.  Regarding abx, she should not need prophylactic abx.  She can confirm with cardiology as well.

## 2020-01-11 NOTE — Telephone Encounter (Signed)
Patient scheduled for labs

## 2020-01-11 NOTE — Telephone Encounter (Signed)
Patient is going to consult with cardiology regarding abx.

## 2020-01-17 ENCOUNTER — Other Ambulatory Visit: Payer: Self-pay

## 2020-01-17 ENCOUNTER — Other Ambulatory Visit (INDEPENDENT_AMBULATORY_CARE_PROVIDER_SITE_OTHER): Payer: Medicare Other

## 2020-01-17 DIAGNOSIS — E119 Type 2 diabetes mellitus without complications: Secondary | ICD-10-CM

## 2020-01-17 LAB — BASIC METABOLIC PANEL
BUN: 10 mg/dL (ref 6–23)
CO2: 31 mEq/L (ref 19–32)
Calcium: 9.6 mg/dL (ref 8.4–10.5)
Chloride: 99 mEq/L (ref 96–112)
Creatinine, Ser: 0.86 mg/dL (ref 0.40–1.20)
GFR: 65.52 mL/min (ref >60.00)
Glucose, Bld: 124 mg/dL — ABNORMAL HIGH (ref 70–99)
Potassium: 4.9 mEq/L (ref 3.5–5.1)
Sodium: 134 mEq/L — ABNORMAL LOW (ref 135–145)

## 2020-01-18 ENCOUNTER — Other Ambulatory Visit: Payer: Self-pay | Admitting: Internal Medicine

## 2020-01-18 DIAGNOSIS — R252 Cramp and spasm: Secondary | ICD-10-CM

## 2020-01-18 NOTE — Progress Notes (Signed)
Order placed for add on lab for magnesium.

## 2020-02-07 ENCOUNTER — Ambulatory Visit (INDEPENDENT_AMBULATORY_CARE_PROVIDER_SITE_OTHER): Payer: Medicare Other

## 2020-02-07 VITALS — Ht 65.0 in | Wt 123.0 lb

## 2020-02-07 DIAGNOSIS — Z0000 Encounter for general adult medical examination without abnormal findings: Secondary | ICD-10-CM

## 2020-02-07 NOTE — Progress Notes (Addendum)
Subjective:   Donna Howell is a 69 y.o. female who presents for Medicare Annual (Subsequent) preventive examination.  Review of Systems    No ROS.  Medicare Wellness Virtual Visit.    Cardiac Risk Factors include: advanced age (>57men, >3 women);diabetes mellitus;hypertension     Objective:    There were no vitals filed for this visit. There is no height or weight on file to calculate BMI.  Advanced Directives 02/07/2020 02/04/2019 02/01/2018  Does Patient Have a Medical Advance Directive? Yes Yes Yes  Type of Estate agent of [place];Living will Healthcare Power of [place];Living will Healthcare Power of [place];Living will  Does patient want to make changes to medical advance directive? No - Patient declined No - Patient declined No - Patient declined  Copy of Healthcare Power of Attorney in Chart? No - copy requested No - copy requested No - copy requested    Current Medications (verified) Outpatient Encounter Medications as of 02/07/2020  Medication Sig  . eszopiclone (LUNESTA) 2 MG TABS tablet TAKE 1 TABLET(2 MG) BY MOUTH AT BEDTIME  . glucose blood test strip Use as instructed to check blood sugars daily. DX E11.9  . levothyroxine (SYNTHROID) 50 MCG tablet TAKE 1 TABLET DAILY  . losartan (COZAAR) 25 MG tablet Take 1 tablet (25 mg total) by mouth daily.  . metFORMIN (GLUCOPHAGE-XR) 500 MG 24 hr tablet Take 2 tablets (1,000 mg total) by mouth 2 (two) times daily with a meal.  . metoprolol succinate (TOPROL-XL) 25 MG 24 hr tablet Take 1 tablet (25 mg total) by mouth in the morning AND 0.5 tablets (12.5 mg total) at bedtime.  . naproxen (NAPROSYN) 500 MG tablet Take 1 tablet (500 mg total) by mouth daily as needed.  . rosuvastatin (CRESTOR) 20 MG tablet TAKE 1 TABLET DAILY   No facility-administered encounter medications on file as of 02/07/2020.    Allergies (verified) Contrast media [iodinated diagnostic agents], Penicillins, Percocet  [oxycodone-acetaminophen], and Percodan [oxycodone-aspirin]   History: Past Medical History:  Diagnosis Date  . Arthritis   . Chicken pox   . Diabetes (HCC)   . Frequent headaches   . GERD (gastroesophageal reflux disease)   . Hay fever   . Heart murmur   . High blood pressure   . History of bronchitis   . Hypothyroidism    Past Surgical History:  Procedure Laterality Date  . APPENDECTOMY  1974  . TONSILLECTOMY AND ADENOIDECTOMY  1962   Family History  Problem Relation Age of Onset  . Arthritis Mother   . Heart disease Mother   . Diabetes Mother   . Stroke Father   . Arthritis Sister   . Breast cancer Sister 59  . Alcoholism Brother   . Arthritis Maternal Grandmother   . Heart disease Maternal Grandmother   . Diabetes Maternal Grandmother    Social History   Socioeconomic History  . Marital status: Married    Spouse name: Not on file  . Number of children: Not on file  . Years of education: Not on file  . Highest education level: Not on file  Occupational History  . Not on file  Tobacco Use  . Smoking status: Never Smoker  . Smokeless tobacco: Never Used  Substance and Sexual Activity  . Alcohol use: No  . Drug use: No  . Sexual activity: Not on file  Other Topics Concern  . Not on file  Social History Narrative  . Not on file   Social Determinants of Health  Financial Resource Strain:   . Difficulty of Paying Living Expenses:   Food Insecurity:   . Worried About Programme researcher, broadcasting/film/videounning Out of Food in the Last Year:   . Baristaan Out of Food in the Last Year:   Transportation Needs:   . Freight forwarderLack of Transportation (Medical):   Marland Kitchen. Lack of Transportation (Non-Medical):   Physical Activity:   . Days of Exercise per Week:   . Minutes of Exercise per Session:   Stress:   . Feeling of Stress :   Social Connections:   . Frequency of Communication with Friends and Family:   . Frequency of Social Gatherings with Friends and Family:   . Attends Religious Services:   . Active  Member of Clubs or Organizations:   . Attends BankerClub or Organization Meetings:   Marland Kitchen. Marital Status:     Tobacco Counseling Counseling given: Not Answered   Clinical Intake:  Pre-visit preparation completed: Yes        Diabetes: Yes (Followed by pcp)  How often do you need to have someone help you when you read instructions, pamphlets, or other written materials from your doctor or pharmacy?: 1 - Never  Interpreter Needed?: No      Activities of Daily Living In your present state of health, do you have any difficulty performing the following activities: 02/07/2020  Hearing? N  Vision? N  Difficulty concentrating or making decisions? N  Walking or climbing stairs? N  Dressing or bathing? N  Doing errands, shopping? N  Preparing Food and eating ? N  Using the Toilet? N  In the past six months, have you accidently leaked urine? N  Do you have problems with loss of bowel control? N  Managing your Medications? N  Managing your Finances? N  Housekeeping or managing your Housekeeping? N  Some recent data might be hidden    Patient Care Team: Dale DurhamScott, Charlene, MD as PCP - General (Internal Medicine)  Indicate any recent Medical Services you may have received from other than [facility] providers in the past year (date may be approximate).     Assessment:   This is a routine wellness examination for Donna Howell.  I connected with Donna Howell by telephone and verified that I am speaking with the correct person using two identifiers. Location patient: home Location provider: work Persons participating in the virtual visit: patient, Engineer, civil (consulting)nurse.   Discussed the limitations, risks, security and privacy concerns of performing an evaluation and management service by telephone and the availability of in person appointments. The patient expressed understanding and verbally consented to this telephonic visit.   Interactive audio and video telecommunications were attempted between this provider  and patient, however failed, due to patient having technical difficulties OR patient did not have access to video capability.  We continued and completed visit with audio only.  Some vital signs may be absent or patient reported.   Hearing/Vision screen  Hearing Screening   125Hz  250Hz  500Hz  1000Hz  2000Hz  3000Hz  4000Hz  6000Hz  8000Hz   Right ear:           Left ear:           Comments: Patient is able to hear conversational tones without difficulty.  No issues reported.  Vision Screening Comments: Followed by [facility] Wears corrective lenses Visual acuity not assessed, virtual visit.  They have seen their ophthalmologist in the last 12 months.     Dietary issues and exercise activities discussed: Current Exercise Habits: Home exercise routine, Type of exercise: walking, Intensity:  MildRegular diet Good water intake Caffeine- diet coke  Goals      Patient Stated   .  Follow up with Primary Care Provider (pt-stated)      Keep all routine maintenance appointments Monitor blood sugar daily      Depression Screen PHQ 2/9 Scores 02/07/2020 02/04/2019 02/01/2018 08/04/2017 11/06/2016  PHQ - 2 Score 0 0 0 0 0    Fall Risk Fall Risk  07/12/2019 06/21/2019 02/04/2019 02/01/2018 08/04/2017  Falls in the past year? 1 1 0 No No  Comment - Emmi Telephone Survey: data to providers prior to load - - -  Number falls in past yr: 1 1 - - -  Comment - Emmi Telephone Survey Actual Response = 2 - - -  Injury with Fall? - 0 - - -  Follow up Falls evaluation completed - - - -    Handrails in use when climbing stairs? Yes  Home free of loose throw rugs in walkways, pet beds, electrical cords, etc? Yes  Adequate lighting in your home to reduce risk of falls? Yes   ASSISTIVE DEVICES UTILIZED TO PREVENT FALLS: Use of a cane, walker or w/c? No  Grab bars in the bathroom? No  Shower chair or bench in shower? No  Elevated toilet seat or a handicapped toilet? No   TIMED UP AND GO:  Was the test  performed? No . Virtual visit.  Cognitive Function: Patient is alert and oriented x3. Caregiver at home.  Denies difficulty with focusing and memory loss.  MMSE - Mini Mental State Exam 02/07/2020 02/01/2018  Not completed: Unable to complete -  Orientation to time - 5  Orientation to Place - 5  Registration - 3  Attention/ Calculation - 5  Recall - 3  Language- name 2 objects - 2  Language- repeat - 1  Language- follow 3 step command - 3  Language- read & follow direction - 1  Write a sentence - 1  Copy design - 1  Total score - 30     6CIT Screen 02/04/2019  What Year? 0 points  What month? 0 points  What time? 0 points  Count back from 20 0 points  Months in reverse 0 points  Repeat phrase 0 points  Total Score 0   Immunizations Immunization History  Administered Date(s) Administered  . Influenza Whole 05/04/2017  . Influenza,inj,Quad PF,6+ Mos 05/04/2017  . Influenza-Unspecified 05/03/2018  . Moderna SARS-COVID-2 Vaccination 08/30/2019, 09/27/2019  . Pneumococcal Conjugate-13 08/01/2017  . Pneumococcal Polysaccharide-23 10/26/2018  . Td 07/11/2018  . Zoster 07/11/2013   Health Maintenance Health Maintenance  Topic Date Due  . FOOT EXAM  03/20/2020 (Originally 02/25/2018)  . Fecal DNA (Cologuard)  03/20/2020 (Originally 07/09/2001)  . OPHTHALMOLOGY EXAM  03/27/2020 (Originally 01/19/2019)  . INFLUENZA VACCINE  02/26/2020  . HEMOGLOBIN A1C  05/12/2020  . MAMMOGRAM  05/29/2021  . TETANUS/TDAP  07/11/2028  . DEXA SCAN  Completed  . COVID-19 Vaccine  Completed  . Hepatitis C Screening  Completed  . PNA vac Low Risk Adult  Completed   Cologuard- deferred per patient preference.   Foot exam- followed by pcp. Deferred for cpe 03/20/20.  Eye Exam- Next scheduled visit by 03/27/20.  Dental Screening: Recommended annual dental exams for proper oral hygiene  Community Resource Referral / Chronic Care Management: CRR required this visit?  No   CCM required this  visit?  No      Plan:   Keep all routine maintenance appointments.  Next scheduled fasting lab 03/15/20 @ 8:00  Cpe 03/21/20 @ 9:00  I have personally reviewed and noted the following in the patient's chart:   . Medical and social history . Use of alcohol, tobacco or illicit drugs  . Current medications and supplements . Functional ability and status . Nutritional status . Physical activity . Advanced directives . List of other physicians . Hospitalizations, surgeries, and ER visits in previous 12 months . Vitals . Screenings to include cognitive, depression, and falls . Referrals and appointments  In addition, I have reviewed and discussed with patient certain preventive protocols, quality metrics, and best practice recommendations. A written personalized care plan for preventive services as well as general preventive health recommendations were provided to patient via mychart.     Ashok Pall, LPN   [id number]     Reviewed above information.  Agree with assessment and plan.    Dr Lorin Picket

## 2020-02-07 NOTE — Patient Instructions (Addendum)
Donna Howell , Thank you for taking time to come for your Medicare Wellness Visit. I appreciate your ongoing commitment to your health goals. Please review the following plan we discussed and let me know if I can assist you in the future.   These are the goals we discussed: Goals      Patient Stated   .  Follow up with Primary Care Provider (pt-stated)      Keep all routine maintenance appointments Monitor blood sugar daily       This is a list of the screening recommended for you and due dates:  Health Maintenance  Topic Date Due  . Complete foot exam   03/20/2020*  . Cologuard (Stool DNA test)  03/20/2020*  . Eye exam for diabetics  03/27/2020*  . Flu Shot  02/26/2020  . Hemoglobin A1C  05/12/2020  . Mammogram  05/29/2021  . Tetanus Vaccine  07/11/2028  . DEXA scan (bone density measurement)  Completed  . COVID-19 Vaccine  Completed  .  Hepatitis C: One time screening is recommended by Center for Disease Control  (CDC) for  adults born from [birth year] through 1965.   Completed  . Pneumonia vaccines  Completed  *Topic was postponed. The date shown is not the original due date.    Immunizations Immunization History  Administered Date(s) Administered  . Influenza Whole 05/04/2017  . Influenza,inj,Quad PF,6+ Mos 05/04/2017  . Influenza-Unspecified 05/03/2018  . Moderna SARS-COVID-2 Vaccination 08/30/2019, 09/27/2019  . Pneumococcal Conjugate-13 08/01/2017  . Pneumococcal Polysaccharide-23 10/26/2018  . Td 07/11/2018  . Zoster 07/11/2013   Keep all routine maintenance appointments.   Next scheduled fasting lab 03/15/20 @ 8:00  Cpe 03/21/20 @ 9:00  Advanced directives: End of life planning; Advance aging; Advanced directives discussed.  Copy of current HCPOA/Living Will requested.    Conditions/risks identified: none new  Follow up in one year for your annual wellness visit.   Preventive Care 22 Years and Older, Female Preventive care refers to lifestyle choices and  visits with your health care provider that can promote health and wellness. What does preventive care include?  A yearly physical exam. This is also called an annual well check.  Dental exams once or twice a year.  Routine eye exams. Ask your health care provider how often you should have your eyes checked.  Personal lifestyle choices, including:  Daily care of your teeth and gums.  Regular physical activity.  Eating a healthy diet.  Avoiding tobacco and drug use.  Limiting alcohol use.  Practicing safe sex.  Taking low-dose aspirin every day.  Taking vitamin and mineral supplements as recommended by your health care provider. What happens during an annual well check? The services and screenings done by your health care provider during your annual well check will depend on your age, overall health, lifestyle risk factors, and family history of disease. Counseling  Your health care provider may ask you questions about your:  Alcohol use.  Tobacco use.  Drug use.  Emotional well-being.  Home and relationship well-being.  Sexual activity.  Eating habits.  History of falls.  Memory and ability to understand (cognition).  Work and work Astronomer.  Reproductive health. Screening  You may have the following tests or measurements:  Height, weight, and BMI.  Blood pressure.  Lipid and cholesterol levels. These may be checked every 5 years, or more frequently if you are over 51 years old.  Skin check.  Lung cancer screening. You may have this screening every year starting  at age 47 if you have a 30-pack-year history of smoking and currently smoke or have quit within the past 15 years.  Fecal occult blood test (FOBT) of the stool. You may have this test every year starting at age 25.  Flexible sigmoidoscopy or colonoscopy. You may have a sigmoidoscopy every 5 years or a colonoscopy every 10 years starting at age 53.  Hepatitis C blood test.  Hepatitis B  blood test.  Sexually transmitted disease (STD) testing.  Diabetes screening. This is done by checking your blood sugar (glucose) after you have not eaten for a while (fasting). You may have this done every 1-3 years.  Bone density scan. This is done to screen for osteoporosis. You may have this done starting at age 39.  Mammogram. This may be done every 1-2 years. Talk to your health care provider about how often you should have regular mammograms. Talk with your health care provider about your test results, treatment options, and if necessary, the need for more tests. Vaccines  Your health care provider may recommend certain vaccines, such as:  Influenza vaccine. This is recommended every year.  Tetanus, diphtheria, and acellular pertussis (Tdap, Td) vaccine. You may need a Td booster every 10 years.  Zoster vaccine. You may need this after age 86.  Pneumococcal 13-valent conjugate (PCV13) vaccine. One dose is recommended after age 70.  Pneumococcal polysaccharide (PPSV23) vaccine. One dose is recommended after age 66. Talk to your health care provider about which screenings and vaccines you need and how often you need them. This information is not intended to replace advice given to you by your health care provider. Make sure you discuss any questions you have with your health care provider. Document Released: 08/10/2015 Document Revised: 04/02/2016 Document Reviewed: 05/15/2015 Elsevier Interactive Patient Education  2017 [employer].  Fall Prevention in the Home Falls can cause injuries. They can happen to people of all ages. There are many things you can do to make your home safe and to help prevent falls. What can I do on the outside of my home?  Regularly fix the edges of walkways and driveways and fix any cracks.  Remove anything that might make you trip as you walk through a door, such as a raised step or threshold.  Trim any bushes or trees on the path to your  home.  Use bright outdoor lighting.  Clear any walking paths of anything that might make someone trip, such as rocks or tools.  Regularly check to see if handrails are loose or broken. Make sure that both sides of any steps have handrails.  Any raised decks and porches should have guardrails on the edges.  Have any leaves, snow, or ice cleared regularly.  Use sand or salt on walking paths during winter.  Clean up any spills in your garage right away. This includes oil or grease spills. What can I do in the bathroom?  Use night lights.  Install grab bars by the toilet and in the tub and shower. Do not use towel bars as grab bars.  Use non-skid mats or decals in the tub or shower.  If you need to sit down in the shower, use a plastic, non-slip stool.  Keep the floor dry. Clean up any water that spills on the floor as soon as it happens.  Remove soap buildup in the tub or shower regularly.  Attach bath mats securely with double-sided non-slip rug tape.  Do not have throw rugs and other things  on the floor that can make you trip. What can I do in the bedroom?  Use night lights.  Make sure that you have a light by your bed that is easy to reach.  Do not use any sheets or blankets that are too big for your bed. They should not hang down onto the floor.  Have a firm chair that has side arms. You can use this for support while you get dressed.  Do not have throw rugs and other things on the floor that can make you trip. What can I do in the kitchen?  Clean up any spills right away.  Avoid walking on wet floors.  Keep items that you use a lot in easy-to-reach places.  If you need to reach something above you, use a strong step stool that has a grab bar.  Keep electrical cords out of the way.  Do not use floor polish or wax that makes floors slippery. If you must use wax, use non-skid floor wax.  Do not have throw rugs and other things on the floor that can make you  trip. What can I do with my stairs?  Do not leave any items on the stairs.  Make sure that there are handrails on both sides of the stairs and use them. Fix handrails that are broken or loose. Make sure that handrails are as long as the stairways.  Check any carpeting to make sure that it is firmly attached to the stairs. Fix any carpet that is loose or worn.  Avoid having throw rugs at the top or bottom of the stairs. If you do have throw rugs, attach them to the floor with carpet tape.  Make sure that you have a light switch at the top of the stairs and the bottom of the stairs. If you do not have them, ask someone to add them for you. What else can I do to help prevent falls?  Wear shoes that:  Do not have high heels.  Have rubber bottoms.  Are comfortable and fit you well.  Are closed at the toe. Do not wear sandals.  If you use a stepladder:  Make sure that it is fully opened. Do not climb a closed stepladder.  Make sure that both sides of the stepladder are locked into place.  Ask someone to hold it for you, if possible.  Clearly mark and make sure that you can see:  Any grab bars or handrails.  First and last steps.  Where the edge of each step is.  Use tools that help you move around (mobility aids) if they are needed. These include:  Canes.  Walkers.  Scooters.  Crutches.  Turn on the lights when you go into a dark area. Replace any light bulbs as soon as they burn out.  Set up your furniture so you have a clear path. Avoid moving your furniture around.  If any of your floors are uneven, fix them.  If there are any pets around you, be aware of where they are.  Review your medicines with your doctor. Some medicines can make you feel dizzy. This can increase your chance of falling. Ask your doctor what other things that you can do to help prevent falls. This information is not intended to replace advice given to you by your health care provider. Make  sure you discuss any questions you have with your health care provider. Document Released: 05/10/2009 Document Revised: 12/20/2015 Document Reviewed: 08/18/2014 Elsevier Interactive Patient Education  2017 [place].

## 2020-02-29 DIAGNOSIS — E119 Type 2 diabetes mellitus without complications: Secondary | ICD-10-CM | POA: Diagnosis not present

## 2020-02-29 DIAGNOSIS — I10 Essential (primary) hypertension: Secondary | ICD-10-CM | POA: Diagnosis not present

## 2020-02-29 DIAGNOSIS — H40033 Anatomical narrow angle, bilateral: Secondary | ICD-10-CM | POA: Diagnosis not present

## 2020-02-29 DIAGNOSIS — H2513 Age-related nuclear cataract, bilateral: Secondary | ICD-10-CM | POA: Diagnosis not present

## 2020-02-29 DIAGNOSIS — H18413 Arcus senilis, bilateral: Secondary | ICD-10-CM | POA: Diagnosis not present

## 2020-02-29 DIAGNOSIS — E7800 Pure hypercholesterolemia, unspecified: Secondary | ICD-10-CM | POA: Diagnosis not present

## 2020-03-09 DIAGNOSIS — H40033 Anatomical narrow angle, bilateral: Secondary | ICD-10-CM | POA: Diagnosis not present

## 2020-03-15 ENCOUNTER — Other Ambulatory Visit: Payer: Self-pay

## 2020-03-15 ENCOUNTER — Other Ambulatory Visit (INDEPENDENT_AMBULATORY_CARE_PROVIDER_SITE_OTHER): Payer: Medicare Other

## 2020-03-15 DIAGNOSIS — E7800 Pure hypercholesterolemia, unspecified: Secondary | ICD-10-CM

## 2020-03-15 DIAGNOSIS — I10 Essential (primary) hypertension: Secondary | ICD-10-CM

## 2020-03-15 DIAGNOSIS — E119 Type 2 diabetes mellitus without complications: Secondary | ICD-10-CM

## 2020-03-15 DIAGNOSIS — R252 Cramp and spasm: Secondary | ICD-10-CM | POA: Diagnosis not present

## 2020-03-15 DIAGNOSIS — E039 Hypothyroidism, unspecified: Secondary | ICD-10-CM | POA: Diagnosis not present

## 2020-03-15 LAB — HEPATIC FUNCTION PANEL
ALT: 20 U/L (ref 0–35)
AST: 28 U/L (ref 0–37)
Albumin: 4.5 g/dL (ref 3.5–5.2)
Alkaline Phosphatase: 50 U/L (ref 39–117)
Bilirubin, Direct: 0.1 mg/dL (ref 0.0–0.3)
Total Bilirubin: 0.7 mg/dL (ref 0.2–1.2)
Total Protein: 6.7 g/dL (ref 6.0–8.3)

## 2020-03-15 LAB — CBC WITH DIFFERENTIAL/PLATELET
Basophils Absolute: 0 10*3/uL (ref 0.0–0.1)
Basophils Relative: 0.8 % (ref 0.0–3.0)
Eosinophils Absolute: 0.1 10*3/uL (ref 0.0–0.7)
Eosinophils Relative: 2.5 % (ref 0.0–5.0)
HCT: 38 % (ref 36.0–46.0)
Hemoglobin: 13.2 g/dL (ref 12.0–15.0)
Lymphocytes Relative: 37.9 % (ref 12.0–46.0)
Lymphs Abs: 2 10*3/uL (ref 0.7–4.0)
MCHC: 34.7 g/dL (ref 30.0–36.0)
MCV: 88 fl (ref 78.0–100.0)
Monocytes Absolute: 0.6 10*3/uL (ref 0.1–1.0)
Monocytes Relative: 11.7 % (ref 3.0–12.0)
Neutro Abs: 2.5 10*3/uL (ref 1.4–7.7)
Neutrophils Relative %: 47.1 % (ref 43.0–77.0)
Platelets: 249 10*3/uL (ref 150.0–400.0)
RBC: 4.32 Mil/uL (ref 3.87–5.11)
RDW: 13.3 % (ref 11.5–15.5)
WBC: 5.3 10*3/uL (ref 4.0–10.5)

## 2020-03-15 LAB — LIPID PANEL
Cholesterol: 138 mg/dL (ref 0–200)
HDL: 81 mg/dL (ref >39.00)
LDL Cholesterol: 44 mg/dL (ref 0–99)
NonHDL: 56.89
Total CHOL/HDL Ratio: 2
Triglycerides: 65 mg/dL (ref 0.0–149.0)
VLDL: 13 mg/dL (ref 0.0–40.0)

## 2020-03-15 LAB — HEMOGLOBIN A1C: Hgb A1c MFr Bld: 6.5 % (ref 4.6–6.5)

## 2020-03-15 LAB — MAGNESIUM: Magnesium: 2 mg/dL (ref 1.5–2.5)

## 2020-03-15 LAB — TSH: TSH: 1.89 u[IU]/mL (ref 0.35–4.50)

## 2020-03-18 ENCOUNTER — Other Ambulatory Visit: Payer: Self-pay | Admitting: Internal Medicine

## 2020-03-20 ENCOUNTER — Telehealth (INDEPENDENT_AMBULATORY_CARE_PROVIDER_SITE_OTHER): Payer: Medicare Other | Admitting: Internal Medicine

## 2020-03-20 DIAGNOSIS — F439 Reaction to severe stress, unspecified: Secondary | ICD-10-CM | POA: Diagnosis not present

## 2020-03-20 DIAGNOSIS — E119 Type 2 diabetes mellitus without complications: Secondary | ICD-10-CM

## 2020-03-20 DIAGNOSIS — I10 Essential (primary) hypertension: Secondary | ICD-10-CM | POA: Diagnosis not present

## 2020-03-20 DIAGNOSIS — R252 Cramp and spasm: Secondary | ICD-10-CM | POA: Diagnosis not present

## 2020-03-20 DIAGNOSIS — G479 Sleep disorder, unspecified: Secondary | ICD-10-CM | POA: Diagnosis not present

## 2020-03-20 DIAGNOSIS — E7800 Pure hypercholesterolemia, unspecified: Secondary | ICD-10-CM | POA: Diagnosis not present

## 2020-03-20 NOTE — Progress Notes (Signed)
Patient ID: Donna Howell, female   DOB: 11/09/1950, 69 y.o.   MRN: [id number]   Virtual Visit via telephone Note  This visit type was conducted due to national recommendations for restrictions regarding the COVID-19 pandemic (e.g. social distancing).  This format is felt to be most appropriate for this patient at this time.  All issues noted in this document were discussed and addressed.  No physical exam was performed (except for noted visual exam findings with Video Visits).   I connected with Debbie Ruest by telephone and verified that I am speaking with the correct person using two identifiers. Location patient: home Location provider: work  Persons participating in the telephone visit: patient, provider  The limitations, risks, security and privacy concerns of performing an evaluation and management service by telephone and the availability of in person appointments have been discussed. It has also been discussed with the patient that there may be a patient responsible charge related to this service. The patient expressed understanding and agreed to proceed.   Reason for visit: scheduled follow up  HPI: She reports continued increased stress - mother dementia, other family issues.  Discussed with her today.  She feels she is handling things relatively well.  Discussed ways - for stress relief - she sews, walks, etc.  States she walks 7-8 miles per day.  She reports having cramping in her legs.  Was questioning if related to overuse.  Stays hydrated.  Discussed stretching.  No chest pain or sob reported.  No abdominal pain.  Bowels moving.  Discussed labs.  She watches her diet.     ROS: See pertinent positives and negatives per HPI.  Past Medical History:  Diagnosis Date  . Arthritis   . Chicken pox   . Diabetes (HCC)   . Frequent headaches   . GERD (gastroesophageal reflux disease)   . Hay fever   . Heart murmur   . High blood pressure   . History of bronchitis   .  Hypothyroidism     Past Surgical History:  Procedure Laterality Date  . APPENDECTOMY  1974  . TONSILLECTOMY AND ADENOIDECTOMY  1962    Family History  Problem Relation Age of Onset  . Arthritis Mother   . Heart disease Mother   . Diabetes Mother   . Stroke Father   . Arthritis Sister   . Breast cancer Sister 55  . Alcoholism Brother   . Arthritis Maternal Grandmother   . Heart disease Maternal Grandmother   . Diabetes Maternal Grandmother     SOCIAL HX: reviewed.    Current Outpatient Medications:  .  eszopiclone (LUNESTA) 2 MG TABS tablet, TAKE 1 TABLET(2 MG) BY MOUTH AT BEDTIME, Disp: 30 tablet, Rfl: 2 .  glucose blood (FREESTYLE LITE) test strip, USE AS INSTRUCTED TO CHECK BLOOD SUGARS DAILY, Disp: 100 strip, Rfl: 1 .  levothyroxine (SYNTHROID) 50 MCG tablet, TAKE 1 TABLET DAILY, Disp: 90 tablet, Rfl: 3 .  losartan (COZAAR) 25 MG tablet, Take 1 tablet (25 mg total) by mouth daily., Disp: 90 tablet, Rfl: 1 .  metFORMIN (GLUCOPHAGE-XR) 500 MG 24 hr tablet, Take 2 tablets (1,000 mg total) by mouth 2 (two) times daily with a meal., Disp: 360 tablet, Rfl: 3 .  metoprolol succinate (TOPROL-XL) 25 MG 24 hr tablet, Take 1 tablet (25 mg total) by mouth in the morning AND 0.5 tablets (12.5 mg total) at bedtime., Disp: 90 tablet, Rfl: 3 .  naproxen (NAPROSYN) 500 MG tablet, Take 1 tablet (500 mg   total) by mouth daily as needed., Disp: 30 tablet, Rfl: 0 .  rosuvastatin (CRESTOR) 20 MG tablet, TAKE 1 TABLET DAILY, Disp: 90 tablet, Rfl: 3  EXAM:  VITALS per patient if applicable: 130/84  GENERAL: alert. Sounds to be in no acute distress.  Answering questions appropriately.    PSYCH/NEURO: pleasant and cooperative, no obvious depression or anxiety, speech and thought processing grossly intact  ASSESSMENT AND PLAN:  Discussed the following assessment and plan:  Stress Increased stress as outlined.  Overall appears to be handling stress well.  Does not feel needs any further  intervention at this time.  Follow.    Hypertension, essential Blood pressure as outlined.  Continue losartan and metoprolol.  Follow pressures.  Follow metabolic panel.   Hypercholesterolemia On crestor.  Following her diet.  Follow lipid panel and liver function tests.    Difficulty sleeping Continue lunesta.   Diabetes mellitus without complication (HCC) She monitors carb intake and her diet.  Exercises regularly.  Follow met b and a1c.   Lab Results  Component Value Date   HGBA1C 6.5 03/15/2020    Leg cramps Stays hydrated.  Walks 7-8 miles per day.  Discussed "overuse".  Discussed stretching.  Follow.     I discussed the assessment and treatment plan with the patient. The patient was provided an opportunity to ask questions and all were answered. The patient agreed with the plan and demonstrated an understanding of the instructions.   The patient was advised to call back or seek an in-person evaluation if the symptoms worsen or if the condition fails to improve as anticipated.  I provided 23 minutes of non-face-to-face time during this encounter.   Charlene Scott, MD  

## 2020-03-22 ENCOUNTER — Other Ambulatory Visit: Payer: Self-pay | Admitting: Internal Medicine

## 2020-03-22 ENCOUNTER — Encounter: Payer: Self-pay | Admitting: Internal Medicine

## 2020-03-22 MED ORDER — ESZOPICLONE 2 MG PO TABS
ORAL_TABLET | ORAL | 2 refills | Status: DC
Start: 2020-03-22 — End: 2020-06-18

## 2020-03-22 NOTE — Telephone Encounter (Signed)
Pt just saw you on 8/24 and last refill 5/26.

## 2020-03-22 NOTE — Telephone Encounter (Signed)
rx sent in for lunesta.

## 2020-03-23 NOTE — Telephone Encounter (Signed)
rx for lunesta already sent in.

## 2020-04-01 ENCOUNTER — Encounter: Payer: Self-pay | Admitting: Internal Medicine

## 2020-04-01 DIAGNOSIS — R252 Cramp and spasm: Secondary | ICD-10-CM | POA: Insufficient documentation

## 2020-04-01 NOTE — Assessment & Plan Note (Signed)
Continue lunesta.  

## 2020-04-01 NOTE — Assessment & Plan Note (Signed)
Blood pressure as outlined.  Continue losartan and metoprolol.  Follow pressures.  Follow metabolic panel.  

## 2020-04-01 NOTE — Assessment & Plan Note (Signed)
She monitors carb intake and her diet.  Exercises regularly.  Follow met b and a1c.   Lab Results  Component Value Date   HGBA1C 6.5 03/15/2020

## 2020-04-01 NOTE — Assessment & Plan Note (Signed)
On crestor.  Following her diet.  Follow lipid panel and liver function tests.

## 2020-04-01 NOTE — Assessment & Plan Note (Signed)
Stays hydrated.  Walks 7-8 miles per day.  Discussed "overuse".  Discussed stretching.  Follow.

## 2020-04-01 NOTE — Assessment & Plan Note (Signed)
Increased stress as outlined.  Overall appears to be handling stress well.  Does not feel needs any further intervention at this time.  Follow.

## 2020-04-19 DIAGNOSIS — I071 Rheumatic tricuspid insufficiency: Secondary | ICD-10-CM | POA: Diagnosis not present

## 2020-04-19 DIAGNOSIS — E119 Type 2 diabetes mellitus without complications: Secondary | ICD-10-CM | POA: Diagnosis not present

## 2020-04-19 DIAGNOSIS — I10 Essential (primary) hypertension: Secondary | ICD-10-CM | POA: Diagnosis not present

## 2020-04-19 DIAGNOSIS — E7800 Pure hypercholesterolemia, unspecified: Secondary | ICD-10-CM | POA: Diagnosis not present

## 2020-04-19 DIAGNOSIS — I498 Other specified cardiac arrhythmias: Secondary | ICD-10-CM | POA: Diagnosis not present

## 2020-04-25 DIAGNOSIS — Z23 Encounter for immunization: Secondary | ICD-10-CM | POA: Diagnosis not present

## 2020-06-11 ENCOUNTER — Telehealth: Payer: Self-pay | Admitting: Internal Medicine

## 2020-06-11 DIAGNOSIS — E119 Type 2 diabetes mellitus without complications: Secondary | ICD-10-CM

## 2020-06-11 DIAGNOSIS — I10 Essential (primary) hypertension: Secondary | ICD-10-CM

## 2020-06-11 DIAGNOSIS — E7800 Pure hypercholesterolemia, unspecified: Secondary | ICD-10-CM

## 2020-06-11 NOTE — Telephone Encounter (Signed)
Patient called in wants to have labs prior to visit her appointment is 07-10-2020

## 2020-06-18 ENCOUNTER — Telehealth: Payer: Self-pay | Admitting: Internal Medicine

## 2020-06-18 MED ORDER — ESZOPICLONE 2 MG PO TABS
ORAL_TABLET | ORAL | 2 refills | Status: DC
Start: 2020-06-18 — End: 2020-09-17

## 2020-06-18 NOTE — Telephone Encounter (Signed)
I ordered her labs. Please call and schedule her for a fasting lab prior to appt.

## 2020-06-18 NOTE — Addendum Note (Signed)
Addended by: Charm Barges on: 06/18/2020 01:36 PM   Modules accepted: Orders

## 2020-06-18 NOTE — Telephone Encounter (Signed)
Pt called she needs a refill on eszopiclone (LUNESTA) 2 MG TABS tablet sent to [facility]

## 2020-06-18 NOTE — Telephone Encounter (Signed)
Signed lab orders.

## 2020-06-18 NOTE — Telephone Encounter (Signed)
Pt called to follow up on lab orders.

## 2020-06-18 NOTE — Telephone Encounter (Signed)
LMTCB and schedule fasting lab before 12/14 appt

## 2020-06-18 NOTE — Telephone Encounter (Signed)
rx ok'd for lunesta #30 with 2 refills.   

## 2020-06-27 ENCOUNTER — Other Ambulatory Visit: Payer: Self-pay

## 2020-06-27 ENCOUNTER — Other Ambulatory Visit (INDEPENDENT_AMBULATORY_CARE_PROVIDER_SITE_OTHER): Payer: Medicare Other

## 2020-06-27 DIAGNOSIS — E7800 Pure hypercholesterolemia, unspecified: Secondary | ICD-10-CM | POA: Diagnosis not present

## 2020-06-27 DIAGNOSIS — E119 Type 2 diabetes mellitus without complications: Secondary | ICD-10-CM

## 2020-06-27 DIAGNOSIS — I10 Essential (primary) hypertension: Secondary | ICD-10-CM

## 2020-06-27 LAB — HEPATIC FUNCTION PANEL
ALT: 24 U/L (ref 0–35)
AST: 29 U/L (ref 0–37)
Albumin: 4.8 g/dL (ref 3.5–5.2)
Alkaline Phosphatase: 52 U/L (ref 39–117)
Bilirubin, Direct: 0.1 mg/dL (ref 0.0–0.3)
Total Bilirubin: 0.7 mg/dL (ref 0.2–1.2)
Total Protein: 7.2 g/dL (ref 6.0–8.3)

## 2020-06-27 LAB — BASIC METABOLIC PANEL
BUN: 11 mg/dL (ref 6–23)
CO2: 30 mEq/L (ref 19–32)
Calcium: 9.9 mg/dL (ref 8.4–10.5)
Chloride: 97 mEq/L (ref 96–112)
Creatinine, Ser: 0.88 mg/dL (ref 0.40–1.20)
GFR: 67.27 mL/min (ref >60.00)
Glucose, Bld: 104 mg/dL — ABNORMAL HIGH (ref 70–99)
Potassium: 4.4 mEq/L (ref 3.5–5.1)
Sodium: 134 mEq/L — ABNORMAL LOW (ref 135–145)

## 2020-06-27 LAB — LIPID PANEL
Cholesterol: 145 mg/dL (ref 0–200)
HDL: 85.1 mg/dL (ref >39.00)
LDL Cholesterol: 46 mg/dL (ref 0–99)
NonHDL: 59.67
Total CHOL/HDL Ratio: 2
Triglycerides: 66 mg/dL (ref 0.0–149.0)
VLDL: 13.2 mg/dL (ref 0.0–40.0)

## 2020-06-27 LAB — HEMOGLOBIN A1C: Hgb A1c MFr Bld: 6.5 % (ref 4.6–6.5)

## 2020-07-10 ENCOUNTER — Other Ambulatory Visit: Payer: Self-pay

## 2020-07-10 ENCOUNTER — Ambulatory Visit (INDEPENDENT_AMBULATORY_CARE_PROVIDER_SITE_OTHER): Payer: Medicare Other | Admitting: Internal Medicine

## 2020-07-10 ENCOUNTER — Encounter: Payer: Self-pay | Admitting: Internal Medicine

## 2020-07-10 DIAGNOSIS — E119 Type 2 diabetes mellitus without complications: Secondary | ICD-10-CM | POA: Diagnosis not present

## 2020-07-10 DIAGNOSIS — G479 Sleep disorder, unspecified: Secondary | ICD-10-CM

## 2020-07-10 DIAGNOSIS — E039 Hypothyroidism, unspecified: Secondary | ICD-10-CM

## 2020-07-10 DIAGNOSIS — Z0000 Encounter for general adult medical examination without abnormal findings: Secondary | ICD-10-CM

## 2020-07-10 DIAGNOSIS — E7800 Pure hypercholesterolemia, unspecified: Secondary | ICD-10-CM | POA: Diagnosis not present

## 2020-07-10 DIAGNOSIS — I10 Essential (primary) hypertension: Secondary | ICD-10-CM

## 2020-07-10 DIAGNOSIS — F439 Reaction to severe stress, unspecified: Secondary | ICD-10-CM | POA: Diagnosis not present

## 2020-07-10 NOTE — Progress Notes (Signed)
Patient ID: Donna Howell, female   DOB: 07-21-1951, 69 y.o.   MRN: [id number]   Subjective:    Patient ID: Donna Howell, female    DOB: 30-Jan-1951, 69 y.o.   MRN: [id number]  HPI This visit occurred during the SARS-CoV-2 public health emergency.  Safety protocols were in place, including screening questions prior to the visit, additional usage of staff PPE, and extensive cleaning of exam room while observing appropriate contact time as indicated for disinfecting solutions.  Patient with past history of mild to moderate TR, diabetes, hypercholesterolemia, hypertension and hypothyroidism.  She comes in today to follow up on these issues as well as for a complete physical exam. She reports she is doing relatively well.  Still exercising.  No chest pain or sob with increased activity or exertion.  No acid reflux or abdominal pain.  Bowels moving.  Doing better now that she is watching her diet.  Discussed labs.  She is concerned regarding a1c 6.5.  Discussed.  States am sugars averaging 120s.  Sleep is an issue.  Taking lunesta.  Not working.  Only sleeps two hours per night.    Past Medical History:  Diagnosis Date  . Arthritis   . Chicken pox   . Diabetes ([place])   . Frequent headaches   . GERD (gastroesophageal reflux disease)   . Hay fever   . Heart murmur   . High blood pressure   . History of bronchitis   . Hypothyroidism    Past Surgical History:  Procedure Laterality Date  . APPENDECTOMY  1974  . TONSILLECTOMY AND ADENOIDECTOMY  1962   Family History  Problem Relation Age of Onset  . Arthritis Mother   . Heart disease Mother   . Diabetes Mother   . Stroke Father   . Arthritis Sister   . Breast cancer Sister 27  . Alcoholism Brother   . Arthritis Maternal Grandmother   . Heart disease Maternal Grandmother   . Diabetes Maternal Grandmother    Social History   Socioeconomic History  . Marital status: Married    Spouse name: Not on file  . Number of children: Not on  file  . Years of education: Not on file  . Highest education level: Not on file  Occupational History  . Not on file  Tobacco Use  . Smoking status: Never Smoker  . Smokeless tobacco: Never Used  Substance and Sexual Activity  . Alcohol use: No  . Drug use: No  . Sexual activity: Not on file  Other Topics Concern  . Not on file  Social History Narrative  . Not on file   Social Determinants of Health   Financial Resource Strain: Not on file  Food Insecurity: Not on file  Transportation Needs: Not on file  Physical Activity: Not on file  Stress: Not on file  Social Connections: Not on file    Outpatient Encounter Medications as of 07/10/2020  Medication Sig  . eszopiclone (LUNESTA) 2 MG TABS tablet TAKE 1 TABLET(2 MG) BY MOUTH AT BEDTIME  . glucose blood (FREESTYLE LITE) test strip USE AS INSTRUCTED TO CHECK BLOOD SUGARS DAILY  . levothyroxine (SYNTHROID) 50 MCG tablet TAKE 1 TABLET DAILY  . losartan (COZAAR) 25 MG tablet Take 1 tablet (25 mg total) by mouth daily.  . metFORMIN (GLUCOPHAGE-XR) 500 MG 24 hr tablet Take 2 tablets (1,000 mg total) by mouth 2 (two) times daily with a meal.  . metoprolol succinate (TOPROL-XL) 25 MG 24 hr tablet Take 1  tablet (25 mg total) by mouth in the morning AND 0.5 tablets (12.5 mg total) at bedtime.  . naproxen (NAPROSYN) 500 MG tablet Take 1 tablet (500 mg total) by mouth daily as needed.  . rosuvastatin (CRESTOR) 20 MG tablet TAKE 1 TABLET DAILY   No facility-administered encounter medications on file as of 07/10/2020.    Review of Systems  Constitutional: Negative for appetite change and unexpected weight change.  HENT: Negative for congestion, sinus pressure and sore throat.   Eyes: Negative for pain and visual disturbance.  Respiratory: Negative for cough, chest tightness and shortness of breath.   Cardiovascular: Negative for chest pain, palpitations and leg swelling.  Gastrointestinal: Negative for abdominal pain, diarrhea, nausea  and vomiting.  Genitourinary: Negative for difficulty urinating and dysuria.  Musculoskeletal: Negative for joint swelling and myalgias.  Skin: Negative for color change and rash.  Neurological: Negative for dizziness, light-headedness and headaches.  Hematological: Negative for adenopathy. Does not bruise/bleed easily.  Psychiatric/Behavioral: Negative for agitation and dysphoric mood.       Objective:    Physical Exam Vitals reviewed.  Constitutional:      General: She is not in acute distress.    Appearance: Normal appearance. She is well-developed and well-nourished.  HENT:     Head: Normocephalic and atraumatic.     Right Ear: External ear normal.     Left Ear: External ear normal.     Mouth/Throat:     Mouth: Oropharynx is clear and moist.  Eyes:     General: No scleral icterus.       Right eye: No discharge.        Left eye: No discharge.     Conjunctiva/sclera: Conjunctivae normal.  Neck:     Thyroid: No thyromegaly.  Cardiovascular:     Rate and Rhythm: Normal rate and regular rhythm.  Pulmonary:     Effort: No tachypnea, accessory muscle usage or respiratory distress.     Breath sounds: Normal breath sounds. No decreased breath sounds or wheezing.  Chest:  Breasts:     Right: No inverted nipple, mass, nipple discharge or tenderness (no axillary adenopathy).     Left: No inverted nipple, mass, nipple discharge or tenderness (no axilarry adenopathy).    Abdominal:     General: Bowel sounds are normal.     Palpations: Abdomen is soft.     Tenderness: There is no abdominal tenderness.  Musculoskeletal:        General: No swelling, tenderness or edema.     Cervical back: Neck supple. No tenderness.  Lymphadenopathy:     Cervical: No cervical adenopathy.  Skin:    Findings: No erythema or rash.  Neurological:     Mental Status: She is alert and oriented to person, place, and time.  Psychiatric:        Mood and Affect: Mood and affect and mood normal.         Behavior: Behavior normal.     BP 114/70   Pulse 65   Temp 97.6 F (36.4 C) (Oral)   Resp 16   Ht 5' 5"  (1.651 m)   Wt 125 lb (56.7 kg)   SpO2 99%   BMI 20.80 kg/m  Wt Readings from Last 3 Encounters:  07/10/20 125 lb (56.7 kg)  03/20/20 123 lb (55.8 kg)  02/07/20 123 lb (55.8 kg)     Lab Results  Component Value Date   WBC 5.3 03/15/2020   HGB 13.2 03/15/2020   HCT 38.0  03/15/2020   PLT 249.0 03/15/2020   GLUCOSE 104 (H) 06/27/2020   CHOL 145 06/27/2020   TRIG 66.0 06/27/2020   HDL 85.10 06/27/2020   LDLCALC 46 06/27/2020   ALT 24 06/27/2020   AST 29 06/27/2020   NA 134 (L) 06/27/2020   K 4.4 06/27/2020   CL 97 06/27/2020   CREATININE 0.88 06/27/2020   BUN 11 06/27/2020   CO2 30 06/27/2020   TSH 1.89 03/15/2020   HGBA1C 6.5 06/27/2020   MICROALBUR <0.7 07/08/2019    MM 3D SCREEN BREAST BILATERAL  Result Date: 05/30/2019 CLINICAL DATA:  Screening. EXAM: DIGITAL SCREENING BILATERAL MAMMOGRAM WITH TOMO AND CAD COMPARISON:  Previous exam(s). ACR Breast Density Category c: The breast tissue is heterogeneously dense, which may obscure small masses. FINDINGS: There are no findings suspicious for malignancy. Images were processed with CAD. IMPRESSION: No mammographic evidence of malignancy. A result letter of this screening mammogram will be mailed directly to the patient. RECOMMENDATION: Screening mammogram in one year. (Code:SM-B-01Y) BI-RADS CATEGORY  1: Negative. Electronically Signed   By: Lovey Newcomer M.D.   On: 05/30/2019 12:51       Assessment & Plan:   Problem List Items Addressed This Visit    Diabetes mellitus without complication ([place])    Low carb diet and exercise.  On metformin.  Discussed other medications.  She had concerns given a1c remaining at 6.5.  Follow met b and a1c.  Continue current medication regimen.       Relevant Orders   Hemoglobin P7T   Basic metabolic panel   Microalbumin / creatinine urine ratio   Hypertension, essential     Continue losartan and metoprolol.  Blood pressure as outlined.  Follow pressures.  Follow metabolic panel.       Hypothyroidism    On thyroid replacement.  Follow tsh.       Healthcare maintenance    Physical today 07/10/20.  Mammogram 05/30/19 - [facility] I.  She will schedule f/u mammogram.  cologuard - has kit.       Difficulty sleeping    On lunesta.  Does not feel is working.  Will taper off.  Has tried [place] and trazodone previously.  (not work).  Taper off and see how she sleeps.  May need further medication.  Follow.  Call with update.        Hypercholesterolemia    On crestor.  Follow lipid panel and liver function tests.        Relevant Orders   Hepatic function panel   Lipid panel   Stress    Increased stress.  Overall appears to be handling things relatively well.  Follow.            Einar Pheasant, MD

## 2020-07-10 NOTE — Assessment & Plan Note (Signed)
Physical today 07/10/20.  Mammogram 05/30/19 - [facility] I.  She will schedule f/u mammogram.  cologuard - has kit.

## 2020-07-16 ENCOUNTER — Encounter: Payer: Self-pay | Admitting: Internal Medicine

## 2020-07-16 NOTE — Assessment & Plan Note (Signed)
Increased stress.  Overall appears to be handling things relatively well.  Follow.   

## 2020-07-16 NOTE — Assessment & Plan Note (Signed)
Continue losartan and metoprolol.  Blood pressure as outlined.  Follow pressures.  Follow metabolic panel.

## 2020-07-16 NOTE — Assessment & Plan Note (Signed)
On thyroid replacement.  Follow tsh.  

## 2020-07-16 NOTE — Assessment & Plan Note (Signed)
On crestor.  Follow lipid panel and liver function tests.   

## 2020-07-16 NOTE — Assessment & Plan Note (Signed)
Low carb diet and exercise.  On metformin.  Discussed other medications.  She had concerns given a1c remaining at 6.5.  Follow met b and a1c.  Continue current medication regimen.

## 2020-07-16 NOTE — Assessment & Plan Note (Signed)
On lunesta.  Does not feel is working.  Will taper off.  Has tried [place] and trazodone previously.  (not work).  Taper off and see how she sleeps.  May need further medication.  Follow.  Call with update.

## 2020-07-18 ENCOUNTER — Encounter: Payer: Self-pay | Admitting: Internal Medicine

## 2020-07-30 ENCOUNTER — Other Ambulatory Visit: Payer: Self-pay | Admitting: Internal Medicine

## 2020-08-21 ENCOUNTER — Encounter: Payer: Self-pay | Admitting: Internal Medicine

## 2020-09-16 ENCOUNTER — Other Ambulatory Visit: Payer: Self-pay | Admitting: Internal Medicine

## 2020-09-17 ENCOUNTER — Telehealth: Payer: Self-pay

## 2020-09-17 MED ORDER — ESZOPICLONE 2 MG PO TABS
ORAL_TABLET | ORAL | 1 refills | Status: DC
Start: 2020-09-17 — End: 2020-11-20

## 2020-09-17 NOTE — Telephone Encounter (Signed)
I have sent in the prescription for lunesta. She can take 1/2 before bed.

## 2020-09-17 NOTE — Telephone Encounter (Signed)
Pt is aware.  

## 2020-09-17 NOTE — Telephone Encounter (Signed)
Last refill was 06/18/20. Pt wants to decrease dose to 1 mg instead of 2mg 

## 2020-09-17 NOTE — Telephone Encounter (Signed)
rx sent in for lunesta 1/2 - 1 tablet q hs prn #30 with one refill.

## 2020-09-17 NOTE — Telephone Encounter (Signed)
Pt would like to get a refill on eszopiclone (LUNESTA) 2 MG TABS tablet BUT WANTS TO GO DOWN TO 1MG  Tabs instead of 2mg . Please send to [facility] on and 

## 2020-09-20 ENCOUNTER — Encounter: Payer: Self-pay | Admitting: Internal Medicine

## 2020-09-20 ENCOUNTER — Ambulatory Visit (INDEPENDENT_AMBULATORY_CARE_PROVIDER_SITE_OTHER): Payer: Medicare Other | Admitting: Internal Medicine

## 2020-09-20 ENCOUNTER — Other Ambulatory Visit: Payer: Self-pay

## 2020-09-20 DIAGNOSIS — K6289 Other specified diseases of anus and rectum: Secondary | ICD-10-CM

## 2020-09-20 DIAGNOSIS — I10 Essential (primary) hypertension: Secondary | ICD-10-CM

## 2020-09-20 DIAGNOSIS — F439 Reaction to severe stress, unspecified: Secondary | ICD-10-CM

## 2020-09-20 DIAGNOSIS — E119 Type 2 diabetes mellitus without complications: Secondary | ICD-10-CM | POA: Diagnosis not present

## 2020-09-20 NOTE — Progress Notes (Signed)
Patient ID: Donna Howell, female   DOB: April 13, 1951, 70 y.o.   MRN: [id number]   Subjective:    Patient ID: Donna Howell, female    DOB: 04-17-1951, 70 y.o.   MRN: [id number]  HPI This visit occurred during the SARS-CoV-2 public health emergency.  Safety protocols were in place, including screening questions prior to the visit, additional usage of staff PPE, and extensive cleaning of exam room while observing appropriate contact time as indicated for disinfecting solutions.  Patient here for work in appt with concerns regarding vaginal "lump".  States she noticed approximately one week ago, lump - peri vaginal area - between rectum and vagina.  No pain.  Has been applying warm compresses.  Is smaller now.  No oozing or redness.  No vaginal or bowel change.  No abdominal pain.  Still with increased stress.  Discussed.  Does not feel needs any further intervention at this time.    Past Medical History:  Diagnosis Date  . Arthritis   . Chicken pox   . Diabetes ([place])   . Frequent headaches   . GERD (gastroesophageal reflux disease)   . Hay fever   . Heart murmur   . High blood pressure   . History of bronchitis   . Hypothyroidism    Past Surgical History:  Procedure Laterality Date  . APPENDECTOMY  1974  . TONSILLECTOMY AND ADENOIDECTOMY  1962   Family History  Problem Relation Age of Onset  . Arthritis Mother   . Heart disease Mother   . Diabetes Mother   . Stroke Father   . Arthritis Sister   . Breast cancer Sister 82  . Alcoholism Brother   . Arthritis Maternal Grandmother   . Heart disease Maternal Grandmother   . Diabetes Maternal Grandmother    Social History   Socioeconomic History  . Marital status: Married    Spouse name: Not on file  . Number of children: Not on file  . Years of education: Not on file  . Highest education level: Not on file  Occupational History  . Not on file  Tobacco Use  . Smoking status: Never Smoker  . Smokeless tobacco: Never Used   Substance and Sexual Activity  . Alcohol use: No  . Drug use: No  . Sexual activity: Not on file  Other Topics Concern  . Not on file  Social History Narrative  . Not on file   Social Determinants of Health   Financial Resource Strain: Not on file  Food Insecurity: Not on file  Transportation Needs: Not on file  Physical Activity: Not on file  Stress: Not on file  Social Connections: Not on file    Outpatient Encounter Medications as of 09/20/2020  Medication Sig  . eszopiclone (LUNESTA) 2 MG TABS tablet TAKE 1/2 to 1 tablet q hs prn.  Marland Kitchen glucose blood (FREESTYLE LITE) test strip USE AS INSTRUCTED TO CHECK BLOOD SUGARS DAILY  . losartan (COZAAR) 25 MG tablet TAKE 1 TABLET DAILY  . metFORMIN (GLUCOPHAGE-XR) 500 MG 24 hr tablet Take 2 tablets (1,000 mg total) by mouth 2 (two) times daily with a meal.  . metoprolol succinate (TOPROL-XL) 25 MG 24 hr tablet Take 1 tablet (25 mg total) by mouth in the morning AND 0.5 tablets (12.5 mg total) at bedtime.  . rosuvastatin (CRESTOR) 20 MG tablet TAKE 1 TABLET DAILY  . SYNTHROID 50 MCG tablet TAKE 1 TABLET DAILY  . naproxen (NAPROSYN) 500 MG tablet Take 1 tablet (500 mg total)  by mouth daily as needed. (Patient not taking: Reported on 09/20/2020)   No facility-administered encounter medications on file as of 09/20/2020.    Review of Systems  Constitutional: Negative for appetite change, fever and unexpected weight change.  HENT: Negative for congestion and sinus pressure.   Respiratory: Negative for cough, chest tightness and shortness of breath.   Cardiovascular: Negative for chest pain, palpitations and leg swelling.  Gastrointestinal: Negative for abdominal pain, diarrhea, nausea and vomiting.  Genitourinary: Negative for difficulty urinating and dysuria.  Musculoskeletal: Negative for joint swelling and myalgias.  Skin: Negative for color change and rash.  Neurological: Negative for dizziness, light-headedness and headaches.   Psychiatric/Behavioral: Negative for agitation and dysphoric mood.       Objective:    Physical Exam Vitals reviewed.  Constitutional:      General: She is not in acute distress.    Appearance: Normal appearance.  HENT:     Head: Normocephalic and atraumatic.     Right Ear: External ear normal.     Left Ear: External ear normal.     Mouth/Throat:     Mouth: Oropharynx is clear and moist.  Eyes:     General: No scleral icterus.       Right eye: No discharge.        Left eye: No discharge.     Conjunctiva/sclera: Conjunctivae normal.  Neck:     Thyroid: No thyromegaly.  Cardiovascular:     Rate and Rhythm: Normal rate and regular rhythm.  Pulmonary:     Effort: No respiratory distress.     Breath sounds: Normal breath sounds. No wheezing.  Abdominal:     General: Bowel sounds are normal.     Palpations: Abdomen is soft.     Tenderness: There is no abdominal tenderness.  Genitourinary:    Comments: Rectum:  No increased erythema.  No drainage.  No palpable mass or hemorrhoid noted during rectal exam. Small palpable - raised area - peri rectal area - non tender.  (smaller).   Musculoskeletal:        General: No swelling, tenderness or edema.     Cervical back: Neck supple. No tenderness.  Lymphadenopathy:     Cervical: No cervical adenopathy.  Skin:    Findings: No erythema or rash.  Neurological:     Mental Status: She is alert.  Psychiatric:        Mood and Affect: Mood normal.        Behavior: Behavior normal.     BP 134/70   Pulse 75   Temp 97.6 F (36.4 C)   Ht 5' 5"  (1.651 m)   Wt 125 lb 12.8 oz (57.1 kg)   SpO2 99%   BMI 20.93 kg/m  Wt Readings from Last 3 Encounters:  09/20/20 125 lb 12.8 oz (57.1 kg)  07/10/20 125 lb (56.7 kg)  03/20/20 123 lb (55.8 kg)     Lab Results  Component Value Date   WBC 5.3 03/15/2020   HGB 13.2 03/15/2020   HCT 38.0 03/15/2020   PLT 249.0 03/15/2020   GLUCOSE 104 (H) 06/27/2020   CHOL 145 06/27/2020   TRIG  66.0 06/27/2020   HDL 85.10 06/27/2020   LDLCALC 46 06/27/2020   ALT 24 06/27/2020   AST 29 06/27/2020   NA 134 (L) 06/27/2020   K 4.4 06/27/2020   CL 97 06/27/2020   CREATININE 0.88 06/27/2020   BUN 11 06/27/2020   CO2 30 06/27/2020   TSH 1.89 03/15/2020  HGBA1C 6.5 06/27/2020   MICROALBUR <0.7 07/08/2019    MM 3D SCREEN BREAST BILATERAL  Result Date: 05/30/2019 CLINICAL DATA:  Screening. EXAM: DIGITAL SCREENING BILATERAL MAMMOGRAM WITH TOMO AND CAD COMPARISON:  Previous exam(s). ACR Breast Density Category c: The breast tissue is heterogeneously dense, which may obscure small masses. FINDINGS: There are no findings suspicious for malignancy. Images were processed with CAD. IMPRESSION: No mammographic evidence of malignancy. A result letter of this screening mammogram will be mailed directly to the patient. RECOMMENDATION: Screening mammogram in one year. (Code:SM-B-01Y) BI-RADS CATEGORY  1: Negative. Electronically Signed   By: Lovey Newcomer M.D.   On: 05/30/2019 12:51       Assessment & Plan:   Problem List Items Addressed This Visit    Diabetes mellitus without complication ([place])    Continues on metformin.  Low carb diet and exercise.  Follow met b and a1c.       Hypertension, essential    Continue losartan and metoprolol.  Blood pressure doing well.  Follow pressures.  Follow metabolic panel.       Rectal lump    She felt a lump - peri rectal region. Has been using warm compresses.  Improved.  No pain. No significant abnormality noted on exam.  Continue warm compresses.  Notify me if recurrence of if persistent problem.       Stress    Increased stress.  Discussed.  She does not feel she needs any further intervention at this time.  Follow.            Einar Pheasant, MD

## 2020-09-22 ENCOUNTER — Encounter: Payer: Self-pay | Admitting: Internal Medicine

## 2020-09-22 DIAGNOSIS — K6289 Other specified diseases of anus and rectum: Secondary | ICD-10-CM | POA: Insufficient documentation

## 2020-09-22 NOTE — Assessment & Plan Note (Signed)
Increased stress.  Discussed.  She does not feel she needs any further intervention at this time.  Follow.

## 2020-09-22 NOTE — Assessment & Plan Note (Signed)
Continue losartan and metoprolol.  Blood pressure doing well.  Follow pressures.  Follow metabolic panel.  

## 2020-09-22 NOTE — Assessment & Plan Note (Signed)
She felt a lump - peri rectal region. Has been using warm compresses.  Improved.  No pain. No significant abnormality noted on exam.  Continue warm compresses.  Notify me if recurrence of if persistent problem.

## 2020-09-22 NOTE — Assessment & Plan Note (Signed)
Continues on metformin.  Low carb diet and exercise.  Follow met b and a1c.

## 2020-10-01 ENCOUNTER — Ambulatory Visit: Payer: Medicare Other | Admitting: Internal Medicine

## 2020-10-08 DIAGNOSIS — E119 Type 2 diabetes mellitus without complications: Secondary | ICD-10-CM | POA: Diagnosis not present

## 2020-10-08 DIAGNOSIS — R079 Chest pain, unspecified: Secondary | ICD-10-CM | POA: Diagnosis not present

## 2020-10-08 DIAGNOSIS — I498 Other specified cardiac arrhythmias: Secondary | ICD-10-CM | POA: Diagnosis not present

## 2020-10-08 DIAGNOSIS — E7800 Pure hypercholesterolemia, unspecified: Secondary | ICD-10-CM | POA: Diagnosis not present

## 2020-10-08 DIAGNOSIS — I071 Rheumatic tricuspid insufficiency: Secondary | ICD-10-CM | POA: Diagnosis not present

## 2020-10-08 DIAGNOSIS — I10 Essential (primary) hypertension: Secondary | ICD-10-CM | POA: Diagnosis not present

## 2020-10-09 ENCOUNTER — Other Ambulatory Visit: Payer: Self-pay | Admitting: Internal Medicine

## 2020-10-29 NOTE — Telephone Encounter (Signed)
Pt called she needs the Lunesta called in for the 1mg  not the 2mg . She can't split the pill properly with out it shattering

## 2020-10-29 NOTE — Telephone Encounter (Signed)
Since she just got the rx refilled on 10/18/20 - I cannot send in another rx yet.

## 2020-10-29 NOTE — Telephone Encounter (Signed)
Patient aware and will wait for next fill.

## 2020-10-29 NOTE — Telephone Encounter (Signed)
Patient would like a refill on her Lunesta. She wants 1 mg not 2 mg. She is having a hard time splitting the 2 mg in half.

## 2020-11-03 DIAGNOSIS — Z23 Encounter for immunization: Secondary | ICD-10-CM | POA: Diagnosis not present

## 2020-11-05 ENCOUNTER — Other Ambulatory Visit: Payer: Self-pay | Admitting: Internal Medicine

## 2020-11-05 DIAGNOSIS — Z1231 Encounter for screening mammogram for malignant neoplasm of breast: Secondary | ICD-10-CM

## 2020-11-08 ENCOUNTER — Encounter: Payer: Self-pay | Admitting: Internal Medicine

## 2020-11-08 ENCOUNTER — Other Ambulatory Visit: Payer: Self-pay

## 2020-11-08 ENCOUNTER — Other Ambulatory Visit (INDEPENDENT_AMBULATORY_CARE_PROVIDER_SITE_OTHER): Payer: Medicare Other

## 2020-11-08 DIAGNOSIS — E119 Type 2 diabetes mellitus without complications: Secondary | ICD-10-CM | POA: Diagnosis not present

## 2020-11-08 DIAGNOSIS — E7800 Pure hypercholesterolemia, unspecified: Secondary | ICD-10-CM

## 2020-11-08 LAB — HEPATIC FUNCTION PANEL
ALT: 21 U/L (ref 0–35)
AST: 26 U/L (ref 0–37)
Albumin: 4.2 g/dL (ref 3.5–5.2)
Alkaline Phosphatase: 49 U/L (ref 39–117)
Bilirubin, Direct: 0.1 mg/dL (ref 0.0–0.3)
Total Bilirubin: 0.6 mg/dL (ref 0.2–1.2)
Total Protein: 6.8 g/dL (ref 6.0–8.3)

## 2020-11-08 LAB — LIPID PANEL
Cholesterol: 113 mg/dL (ref 0–200)
HDL: 67.9 mg/dL (ref >39.00)
LDL Cholesterol: 32 mg/dL (ref 0–99)
NonHDL: 44.95
Total CHOL/HDL Ratio: 2
Triglycerides: 67 mg/dL (ref 0.0–149.0)
VLDL: 13.4 mg/dL (ref 0.0–40.0)

## 2020-11-08 LAB — BASIC METABOLIC PANEL
BUN: 9 mg/dL (ref 6–23)
CO2: 31 mEq/L (ref 19–32)
Calcium: 9.4 mg/dL (ref 8.4–10.5)
Chloride: 99 mEq/L (ref 96–112)
Creatinine, Ser: 0.87 mg/dL (ref 0.40–1.20)
GFR: 68.02 mL/min (ref >60.00)
Glucose, Bld: 105 mg/dL — ABNORMAL HIGH (ref 70–99)
Potassium: 4.3 mEq/L (ref 3.5–5.1)
Sodium: 134 mEq/L — ABNORMAL LOW (ref 135–145)

## 2020-11-08 LAB — MICROALBUMIN / CREATININE URINE RATIO
Creatinine,U: 45 mg/dL
Microalb Creat Ratio: 1.6 mg/g (ref 0.0–30.0)
Microalb, Ur: <0.7 mg/dL (ref 0.0–1.9)

## 2020-11-08 LAB — HEMOGLOBIN A1C: Hgb A1c MFr Bld: 6.5 % (ref 4.6–6.5)

## 2020-11-12 ENCOUNTER — Ambulatory Visit: Payer: Medicare Other | Admitting: Internal Medicine

## 2020-11-20 ENCOUNTER — Encounter: Payer: Self-pay | Admitting: Internal Medicine

## 2020-11-20 ENCOUNTER — Ambulatory Visit (INDEPENDENT_AMBULATORY_CARE_PROVIDER_SITE_OTHER): Payer: Medicare Other | Admitting: Internal Medicine

## 2020-11-20 ENCOUNTER — Other Ambulatory Visit: Payer: Self-pay

## 2020-11-20 DIAGNOSIS — E7800 Pure hypercholesterolemia, unspecified: Secondary | ICD-10-CM | POA: Diagnosis not present

## 2020-11-20 DIAGNOSIS — I10 Essential (primary) hypertension: Secondary | ICD-10-CM

## 2020-11-20 DIAGNOSIS — M79604 Pain in right leg: Secondary | ICD-10-CM | POA: Diagnosis not present

## 2020-11-20 DIAGNOSIS — G479 Sleep disorder, unspecified: Secondary | ICD-10-CM | POA: Diagnosis not present

## 2020-11-20 DIAGNOSIS — E039 Hypothyroidism, unspecified: Secondary | ICD-10-CM

## 2020-11-20 DIAGNOSIS — F439 Reaction to severe stress, unspecified: Secondary | ICD-10-CM | POA: Diagnosis not present

## 2020-11-20 DIAGNOSIS — E119 Type 2 diabetes mellitus without complications: Secondary | ICD-10-CM

## 2020-11-20 LAB — HM DIABETES FOOT EXAM

## 2020-11-20 MED ORDER — ESZOPICLONE 1 MG PO TABS: 1.0000 mg | ORAL_TABLET | Freq: Every evening | ORAL | 1 refills | Status: DC | PRN

## 2020-11-20 NOTE — Progress Notes (Signed)
Patient ID: Donna Howell, female   DOB: 1951/07/27, 70 y.o.   MRN: [id number]   Subjective:    Patient ID: Donna Howell, female    DOB: 1950/11/01, 70 y.o.   MRN: [id number]  HPI This visit occurred during the SARS-CoV-2 public health emergency.  Safety protocols were in place, including screening questions prior to the visit, additional usage of staff PPE, and extensive cleaning of exam room while observing appropriate contact time as indicated for disinfecting solutions.  Patient here for a scheduled follow up. Here to follow up regarding her cholesterol, diabetes and hypertension.  She reports persistent increased stress.  Discussed.  She does not feel needs any further intervention at this time.  Has cut down her lunesta.  On 52m q hs now.  Still stays very active.  Walking.  Does report right leg pain - thigh pain and posterior calf.  If she is standing bending her leg a certain way and changing pressure distribution - helps.  She states she pushes through the pain to keep up her walking and remain active.  Request referral to ortho.  No chest pain or sob reported.  No abdominal pain.  Eating.  No nausea or vomiting.  Bowels moving.  Watching her diet.  Discussed covid booster.  She wants to postpone for now.  Discussed labs.   Past Medical History:  Diagnosis Date  . Arthritis   . Chicken pox   . Diabetes ([place]   . Frequent headaches   . GERD (gastroesophageal reflux disease)   . Hay fever   . Heart murmur   . High blood pressure   . History of bronchitis   . Hypothyroidism    Past Surgical History:  Procedure Laterality Date  . APPENDECTOMY  1974  . TONSILLECTOMY AND ADENOIDECTOMY  1962   Family History  Problem Relation Age of Onset  . Arthritis Mother   . Heart disease Mother   . Diabetes Mother   . Stroke Father   . Arthritis Sister   . Breast cancer Sister 536 . Alcoholism Brother   . Arthritis Maternal Grandmother   . Heart disease Maternal Grandmother   .  Diabetes Maternal Grandmother    Social History   Socioeconomic History  . Marital status: Married    Spouse name: Not on file  . Number of children: Not on file  . Years of education: Not on file  . Highest education level: Not on file  Occupational History  . Not on file  Tobacco Use  . Smoking status: Never Smoker  . Smokeless tobacco: Never Used  Substance and Sexual Activity  . Alcohol use: No  . Drug use: No  . Sexual activity: Not on file  Other Topics Concern  . Not on file  Social History Narrative  . Not on file   Social Determinants of Health   Financial Resource Strain: Not on file  Food Insecurity: Not on file  Transportation Needs: Not on file  Physical Activity: Not on file  Stress: Not on file  Social Connections: Not on file    Outpatient Encounter Medications as of 11/20/2020  Medication Sig  . eszopiclone (LUNESTA) 1 MG TABS tablet Take 1 tablet (1 mg total) by mouth at bedtime as needed for sleep. Take immediately before bedtime  . glucose blood (FREESTYLE LITE) test strip USE AS INSTRUCTED TO CHECK BLOOD SUGARS DAILY  . losartan (COZAAR) 25 MG tablet TAKE 1 TABLET DAILY  . metFORMIN (GLUCOPHAGE-XR) 500 MG 24 hr  tablet TAKE 2 TABLETS TWICE A DAY WITH A MEAL  . metoprolol succinate (TOPROL-XL) 25 MG 24 hr tablet Take 1 tablet (25 mg total) by mouth in the morning AND 0.5 tablets (12.5 mg total) at bedtime.  . naproxen (NAPROSYN) 500 MG tablet Take 1 tablet (500 mg total) by mouth daily as needed.  . rosuvastatin (CRESTOR) 20 MG tablet TAKE 1 TABLET DAILY  . SYNTHROID 50 MCG tablet TAKE 1 TABLET DAILY  . [DISCONTINUED] eszopiclone (LUNESTA) 2 MG TABS tablet TAKE 1/2 to 1 tablet q hs prn.   No facility-administered encounter medications on file as of 11/20/2020.    Review of Systems  Constitutional: Negative for appetite change and unexpected weight change.  HENT: Negative for congestion and sinus pressure.   Respiratory: Negative for cough, chest  tightness and shortness of breath.   Cardiovascular: Negative for chest pain, palpitations and leg swelling.  Gastrointestinal: Negative for abdominal pain, diarrhea, nausea and vomiting.  Genitourinary: Negative for difficulty urinating and dysuria.  Musculoskeletal: Negative for joint swelling.       Right leg pain as outlined.   Skin: Negative for color change and rash.  Neurological: Negative for dizziness, light-headedness and headaches.  Psychiatric/Behavioral: Negative for agitation and dysphoric mood.       Objective:    Physical Exam Vitals reviewed.  Constitutional:      General: She is not in acute distress.    Appearance: Normal appearance.  HENT:     Head: Normocephalic and atraumatic.     Right Ear: External ear normal.     Left Ear: External ear normal.  Eyes:     General: No scleral icterus.       Right eye: No discharge.        Left eye: No discharge.     Conjunctiva/sclera: Conjunctivae normal.  Neck:     Thyroid: No thyromegaly.  Cardiovascular:     Rate and Rhythm: Normal rate and regular rhythm.  Pulmonary:     Effort: No respiratory distress.     Breath sounds: Normal breath sounds. No wheezing.  Abdominal:     General: Bowel sounds are normal.     Palpations: Abdomen is soft.     Tenderness: There is no abdominal tenderness.  Musculoskeletal:        General: No swelling or tenderness.     Cervical back: Neck supple. No tenderness.     Comments: Negative SLR.    Lymphadenopathy:     Cervical: No cervical adenopathy.  Skin:    Findings: No erythema or rash.  Neurological:     Mental Status: She is alert.  Psychiatric:        Mood and Affect: Mood normal.        Behavior: Behavior normal.     BP 120/70 (BP Location: Left Arm, Patient Position: Sitting)   Pulse 83   Temp (!) 97.4 F (36.3 C)   Ht 5' 5"  (1.651 m)   Wt 124 lb 12.8 oz (56.6 kg)   SpO2 96%   BMI 20.77 kg/m  Wt Readings from Last 3 Encounters:  11/20/20 124 lb 12.8 oz  (56.6 kg)  09/20/20 125 lb 12.8 oz (57.1 kg)  07/10/20 125 lb (56.7 kg)     Lab Results  Component Value Date   WBC 5.3 03/15/2020   HGB 13.2 03/15/2020   HCT 38.0 03/15/2020   PLT 249.0 03/15/2020   GLUCOSE 105 (H) 11/08/2020   CHOL 113 11/08/2020   TRIG 67.0  11/08/2020   HDL 67.90 11/08/2020   LDLCALC 32 11/08/2020   ALT 21 11/08/2020   AST 26 11/08/2020   NA 134 (L) 11/08/2020   K 4.3 11/08/2020   CL 99 11/08/2020   CREATININE 0.87 11/08/2020   BUN 9 11/08/2020   CO2 31 11/08/2020   TSH 1.89 03/15/2020   HGBA1C 6.5 11/08/2020   MICROALBUR <0.7 11/08/2020    MM 3D SCREEN BREAST BILATERAL  Result Date: 05/30/2019 CLINICAL DATA:  Screening. EXAM: DIGITAL SCREENING BILATERAL MAMMOGRAM WITH TOMO AND CAD COMPARISON:  Previous exam(s). ACR Breast Density Category c: The breast tissue is heterogeneously dense, which may obscure small masses. FINDINGS: There are no findings suspicious for malignancy. Images were processed with CAD. IMPRESSION: No mammographic evidence of malignancy. A result letter of this screening mammogram will be mailed directly to the patient. RECOMMENDATION: Screening mammogram in one year. (Code:SM-B-01Y) BI-RADS CATEGORY  1: Negative. Electronically Signed   By: Lovey Newcomer M.D.   On: 05/30/2019 12:51       Assessment & Plan:   Problem List Items Addressed This Visit    Diabetes mellitus without complication ([place])    Continues on metformin given elevated blood sugar.  Watches her diet.  Exercises.  Follow met b and a1c.       Relevant Orders   Hemoglobin A1c   Difficulty sleeping    Taking lunesta.  On 80m q hs now.  Follow.       Hypercholesterolemia    Continue crestor.  Follow lipid panel and liver function tests.        Relevant Orders   Hepatic function panel   Lipid panel   Hypertension, essential    Continue losartan and metoprolol.  Blood pressure doing well.  Follow pressures.  Follow metabolic panel.       Relevant Orders   CBC  with Differential/Platelet   Basic metabolic panel   Hypothyroidism    On thyroid replacement.  Follow tsh.       Relevant Orders   TSH   Right leg pain    Question if related to increased walking/muscle strain, etc.  Discussed with her today. Persistent.  She request referral to ortho for evaluation.        Relevant Orders   Ambulatory referral to Orthopedic Surgery   Stress    Increased stress.  Discussed.  She desires no further intervention at this time.  Follow.           CEinar Pheasant MD

## 2020-11-25 ENCOUNTER — Encounter: Payer: Self-pay | Admitting: Internal Medicine

## 2020-11-25 DIAGNOSIS — M79604 Pain in right leg: Secondary | ICD-10-CM | POA: Insufficient documentation

## 2020-11-25 NOTE — Assessment & Plan Note (Signed)
Increased stress.  Discussed.  She desires no further intervention at this time.  Follow.

## 2020-11-25 NOTE — Assessment & Plan Note (Signed)
Taking lunesta.  On 1mg  q hs now.  Follow.

## 2020-11-25 NOTE — Assessment & Plan Note (Signed)
Question if related to increased walking/muscle strain, etc.  Discussed with her today. Persistent.  She request referral to ortho for evaluation.

## 2020-11-25 NOTE — Assessment & Plan Note (Signed)
On thyroid replacement.  Follow tsh.  

## 2020-11-25 NOTE — Assessment & Plan Note (Signed)
Continue crestor.  Follow lipid panel and liver function tests.  

## 2020-11-25 NOTE — Assessment & Plan Note (Signed)
Continues on metformin given elevated blood sugar.  Watches her diet.  Exercises.  Follow met b and a1c.

## 2020-11-25 NOTE — Assessment & Plan Note (Signed)
Continue losartan and metoprolol.  Blood pressure doing well.  Follow pressures.  Follow metabolic panel.  

## 2020-12-03 DIAGNOSIS — M79661 Pain in right lower leg: Secondary | ICD-10-CM | POA: Diagnosis not present

## 2020-12-12 DIAGNOSIS — I071 Rheumatic tricuspid insufficiency: Secondary | ICD-10-CM | POA: Diagnosis not present

## 2020-12-13 ENCOUNTER — Other Ambulatory Visit: Payer: Self-pay | Admitting: Internal Medicine

## 2020-12-28 ENCOUNTER — Other Ambulatory Visit: Payer: Self-pay

## 2020-12-28 ENCOUNTER — Ambulatory Visit
Admission: RE | Admit: 2020-12-28 | Discharge: 2020-12-28 | Disposition: A | Discharge status: Home / Self Care | Payer: Medicare Other | Source: Ambulatory Visit | Attending: Internal Medicine | Admitting: Internal Medicine

## 2020-12-28 DIAGNOSIS — Z1231 Encounter for screening mammogram for malignant neoplasm of breast: Secondary | ICD-10-CM | POA: Diagnosis not present

## 2020-12-31 ENCOUNTER — Other Ambulatory Visit: Payer: Self-pay | Admitting: Internal Medicine

## 2020-12-31 ENCOUNTER — Telehealth: Payer: Self-pay | Admitting: Internal Medicine

## 2020-12-31 DIAGNOSIS — R928 Other abnormal and inconclusive findings on diagnostic imaging of breast: Secondary | ICD-10-CM

## 2020-12-31 DIAGNOSIS — N6489 Other specified disorders of breast: Secondary | ICD-10-CM

## 2020-12-31 NOTE — Telephone Encounter (Signed)
She would like a call back from the office about her mammogram results.

## 2020-12-31 NOTE — Progress Notes (Signed)
Order already placed for left breast mammogram and ultrasound

## 2021-01-01 NOTE — Telephone Encounter (Signed)
Pt notified of results

## 2021-01-01 NOTE — Telephone Encounter (Signed)
LMTCB to go over results 

## 2021-01-07 ENCOUNTER — Telehealth: Payer: Self-pay | Admitting: Internal Medicine

## 2021-01-07 ENCOUNTER — Ambulatory Visit
Admission: RE | Admit: 2021-01-07 | Discharge: 2021-01-07 | Disposition: A | Discharge status: Home / Self Care | Payer: Medicare Other | Source: Ambulatory Visit | Attending: Internal Medicine | Admitting: Internal Medicine

## 2021-01-07 ENCOUNTER — Other Ambulatory Visit: Payer: Self-pay

## 2021-01-07 DIAGNOSIS — R928 Other abnormal and inconclusive findings on diagnostic imaging of breast: Secondary | ICD-10-CM | POA: Diagnosis not present

## 2021-01-07 DIAGNOSIS — N6489 Other specified disorders of breast: Secondary | ICD-10-CM | POA: Diagnosis not present

## 2021-01-07 DIAGNOSIS — R922 Inconclusive mammogram: Secondary | ICD-10-CM | POA: Diagnosis not present

## 2021-01-07 NOTE — Telephone Encounter (Signed)
Called patient to let her know that the radiology report is not in the chart yet. She says they are recommending a biopsy but had to wait for you to notify patient first. Advised that once radiology reviewed and put the report in the chart then someone would be calling her.

## 2021-01-07 NOTE — Telephone Encounter (Signed)
PT called to advise that she has went and gotten her mammogram and ultrasound done and would like for Dr.Scott to call and talk to her about them if she can.

## 2021-01-08 ENCOUNTER — Other Ambulatory Visit: Payer: Self-pay | Admitting: Internal Medicine

## 2021-01-08 DIAGNOSIS — R928 Other abnormal and inconclusive findings on diagnostic imaging of breast: Secondary | ICD-10-CM

## 2021-01-08 NOTE — Progress Notes (Signed)
Order placed for surgery referral.  

## 2021-01-08 NOTE — Telephone Encounter (Signed)
I notified her of mammogram and ultrasound results and recommendation for biopsy.  She prefers to see surgery and then further evaluation based on their recommendation.  Order placed for surgery referral.

## 2021-01-10 ENCOUNTER — Other Ambulatory Visit: Payer: Self-pay | Admitting: General Surgery

## 2021-01-10 ENCOUNTER — Encounter: Payer: Self-pay | Admitting: Internal Medicine

## 2021-01-10 DIAGNOSIS — R928 Other abnormal and inconclusive findings on diagnostic imaging of breast: Secondary | ICD-10-CM

## 2021-01-10 NOTE — Progress Notes (Signed)
c 

## 2021-01-11 MED ORDER — LORAZEPAM 0.5 MG PO TABS: ORAL_TABLET | ORAL | 0 refills | Status: DC

## 2021-01-11 NOTE — Telephone Encounter (Signed)
Patient aware of below. She does not think she has taken this in the past but if she did she does not remember a reaction to it.

## 2021-01-11 NOTE — Telephone Encounter (Signed)
Patient aware of below.

## 2021-01-11 NOTE — Telephone Encounter (Signed)
Please call Ms Brabant.  Has she ever taken lorazepam (ativan).  If no known problems with this medication, I can send in lorazepam .5mg  take 1/2 tablet just prior to procedure.  Let me know if problems.

## 2021-01-11 NOTE — Telephone Encounter (Signed)
Rx sent in for lorazepam - take 1/2 tablet just prior to procedure.  Will need someone to driver her home.

## 2021-01-14 ENCOUNTER — Encounter: Payer: Self-pay | Admitting: Internal Medicine

## 2021-01-14 NOTE — Telephone Encounter (Signed)
Please call her and let her know that I have multiple patients who cut the medication.  If she needs to take the whole tablet, ok.  She does need someone to drive her.

## 2021-01-14 NOTE — Telephone Encounter (Signed)
Patient aware of below.

## 2021-01-16 ENCOUNTER — Ambulatory Visit
Admission: RE | Admit: 2021-01-16 | Discharge: 2021-01-16 | Disposition: A | Discharge status: Home / Self Care | Payer: Medicare Other | Source: Ambulatory Visit | Attending: General Surgery | Admitting: General Surgery

## 2021-01-16 ENCOUNTER — Other Ambulatory Visit: Payer: Self-pay

## 2021-01-16 DIAGNOSIS — N6032 Fibrosclerosis of left breast: Secondary | ICD-10-CM | POA: Diagnosis not present

## 2021-01-16 DIAGNOSIS — R928 Other abnormal and inconclusive findings on diagnostic imaging of breast: Secondary | ICD-10-CM | POA: Insufficient documentation

## 2021-01-16 HISTORY — PX: BREAST BIOPSY: SHX20

## 2021-01-17 ENCOUNTER — Other Ambulatory Visit: Payer: Self-pay

## 2021-01-17 ENCOUNTER — Encounter: Payer: Self-pay | Admitting: Internal Medicine

## 2021-01-17 ENCOUNTER — Other Ambulatory Visit: Payer: Self-pay | Admitting: Internal Medicine

## 2021-01-17 LAB — SURGICAL PATHOLOGY

## 2021-01-17 NOTE — Telephone Encounter (Signed)
Patient went to get her eszopiclone (LUNESTA) 1 MG TABS tablet refilled and her pharmacy said  the refill was denied by her provider. She would like this refilled, please.

## 2021-01-17 NOTE — Addendum Note (Signed)
Addended by: Sherley Bounds on: 01/17/2021 11:09 AM   Modules accepted: Orders

## 2021-01-18 MED ORDER — ESZOPICLONE 1 MG PO TABS: 1.0000 mg | ORAL_TABLET | Freq: Every evening | ORAL | 1 refills | Status: DC | PRN

## 2021-02-07 ENCOUNTER — Ambulatory Visit: Payer: Medicare Other

## 2021-02-08 ENCOUNTER — Ambulatory Visit (INDEPENDENT_AMBULATORY_CARE_PROVIDER_SITE_OTHER): Payer: Medicare Other

## 2021-02-08 ENCOUNTER — Telehealth: Payer: Self-pay

## 2021-02-08 VITALS — Ht 65.0 in | Wt 124.0 lb

## 2021-02-08 DIAGNOSIS — Z0000 Encounter for general adult medical examination without abnormal findings: Secondary | ICD-10-CM

## 2021-02-08 DIAGNOSIS — E119 Type 2 diabetes mellitus without complications: Secondary | ICD-10-CM

## 2021-02-08 NOTE — Progress Notes (Addendum)
Subjective:   Donna Howell is a 70 y.o. female who presents for Medicare Annual (Subsequent) preventive examination.  Review of Systems    No ROS.  Medicare Wellness Virtual Visit.  Visual/audio telehealth visit, UTA vital signs.   See social history for additional risk factors.   Cardiac Risk Factors include: advanced age (>64men, >3 women);diabetes mellitus     Objective:    Today's Vitals   02/08/21 0954  Weight: 124 lb (56.2 kg)  Height: 5\' 5"  (1.651 m)   Body mass index is 20.63 kg/m.  Advanced Directives 02/08/2021 02/07/2020 02/04/2019 02/01/2018  Does Patient Have a Medical Advance Directive? Yes Yes Yes Yes  Type of 04/04/2018 of [place];Living will Healthcare Power of [place];Living will Healthcare Power of [place];Living will Healthcare Power of [place];Living will  Does patient want to make changes to medical advance directive? No - Patient declined No - Patient declined No - Patient declined No - Patient declined  Copy of Healthcare Power of Attorney in Chart? No - copy requested No - copy requested No - copy requested No - copy requested    Current Medications (verified) Outpatient Encounter Medications as of 02/08/2021  Medication Sig   eszopiclone (LUNESTA) 1 MG TABS tablet Take 1 tablet (1 mg total) by mouth at bedtime as needed for sleep. Take immediately before bedtime   glucose blood (FREESTYLE LITE) test strip USE AS INSTRUCTED TO CHECK BLOOD SUGARS DAILY   LORazepam (ATIVAN) 0.5 MG tablet Take 1/2 tablet just prior to procedure.   losartan (COZAAR) 25 MG tablet TAKE 1 TABLET DAILY   metFORMIN (GLUCOPHAGE-XR) 500 MG 24 hr tablet TAKE 2 TABLETS TWICE A DAY WITH A MEAL   metoprolol succinate (TOPROL-XL) 25 MG 24 hr tablet TAKE 1 TABLET IN THE MORNING AND ONE-HALF TABLET (12.5 MG) AT BEDTIME   naproxen (NAPROSYN) 500 MG tablet Take 1 tablet (500 mg total) by mouth daily as needed.   rosuvastatin (CRESTOR) 20 MG tablet TAKE 1 TABLET  DAILY   SYNTHROID 50 MCG tablet TAKE 1 TABLET DAILY   No facility-administered encounter medications on file as of 02/08/2021.    Allergies (verified) Contrast media [iodinated diagnostic agents], Buspirone hcl, Penicillins, Percocet [oxycodone-acetaminophen], and Percodan [oxycodone-aspirin]   History: Past Medical History:  Diagnosis Date   Arthritis    Chicken pox    Diabetes ([facility])    Frequent headaches    GERD (gastroesophageal reflux disease)    Hay fever    Heart murmur    High blood pressure    History of bronchitis    Hypothyroidism    Past Surgical History:  Procedure Laterality Date   APPENDECTOMY  1974   BREAST BIOPSY Left 01/16/2021   Affirm bx-"Ribbon" clip-path pending   TONSILLECTOMY AND ADENOIDECTOMY  1962   Family History  Problem Relation Age of Onset   Arthritis Mother    Heart disease Mother    Diabetes Mother    Stroke Father    Arthritis Sister    Breast cancer Sister 56   Alcoholism Brother    Arthritis Maternal Grandmother    Heart disease Maternal Grandmother    Diabetes Maternal Grandmother    Social History   Socioeconomic History   Marital status: Married    Spouse name: Not on file   Number of children: Not on file   Years of education: Not on file   Highest education level: Not on file  Occupational History   Not on file  Tobacco Use  Smoking status: Never   Smokeless tobacco: Never  Substance and Sexual Activity   Alcohol use: No   Drug use: No   Sexual activity: Not on file  Other Topics Concern   Not on file  Social History Narrative   Not on file   Social Determinants of Health   Financial Resource Strain: Low Risk    Difficulty of Paying Living Expenses: Not hard at all  Food Insecurity: No Food Insecurity   Worried About Programme researcher, broadcasting/film/videounning Out of Food in the Last Year: Never true   Ran Out of Food in the Last Year: Never true  Transportation Needs: No Transportation Needs   Lack of Transportation (Medical): No   Lack  of Transportation (Non-Medical): No  Physical Activity: Sufficiently Active   Days of Exercise per Week: 7 days   Minutes of Exercise per Session: 90 min  Stress: No Stress Concern Present   Feeling of Stress : Not at all  Social Connections: Unknown   Frequency of Communication with Friends and Family: Not on file   Frequency of Social Gatherings with Friends and Family: Not on file   Attends Religious Services: Not on Scientist, clinical (histocompatibility and immunogenetics)file   Active Member of Clubs or Organizations: Not on file   Attends BankerClub or Organization Meetings: Not on file   Marital Status: Married    Tobacco Counseling Counseling given: Not Answered   Clinical Intake:  Pre-visit preparation completed: Yes        Diabetes: Yes (Followed by pcp)  How often do you need to have someone help you when you read instructions, pamphlets, or other written materials from your doctor or pharmacy?: 1 - Never  Nutrition Risk Assessment: Has the patient had any N/V/D within the last 2 months?  No  Does the patient have any non-healing wounds?  No  Has the patient had any unintentional weight loss or weight gain?  No   Diabetes: If diabetic, was a CBG obtained today?  No  Did the patient bring in their glucometer from home?  No  How often do you monitor your CBG's? rarely.   Financial Strains and Diabetes Management: Are you having any financial strains with the device, your supplies or your medication? Yes .  Does the patient want to be seen by Chronic Care Management for management of their diabetes?  Yes  Would the patient like to be referred to a Nutritionist or for Diabetic Management?  No     Interpreter Needed?: No      Activities of Daily Living In your present state of health, do you have any difficulty performing the following activities: 02/08/2021  Hearing? N  Vision? N  Difficulty concentrating or making decisions? N  Walking or climbing stairs? N  Dressing or bathing? N  Doing errands, shopping? N   Preparing Food and eating ? N  Using the Toilet? N  In the past six months, have you accidently leaked urine? N  Do you have problems with loss of bowel control? N  Managing your Medications? N  Managing your Finances? N  Housekeeping or managing your Housekeeping? N  Some recent data might be hidden    Patient Care Team: Dale DurhamScott, Charlene, MD as PCP - General (Internal Medicine)  Indicate any recent Medical Services you may have received from other than [facility] providers in the past year (date may be approximate).     Assessment:   This is a routine wellness examination for Donna Howell.  I connected with Donna Howell today by telephone  and verified that I am speaking with the correct person using two identifiers. Location patient: home Location provider: work Persons participating in the virtual visit: patient, Engineer, civil (consulting).    I discussed the limitations, risks, security and privacy concerns of performing an evaluation and management service by telephone and the availability of in person appointments. I also discussed with the patient that there may be a patient responsible charge related to this service. The patient expressed understanding and verbally consented to this telephonic visit.    Interactive audio and video telecommunications were attempted between this provider and patient, however failed, due to patient having technical difficulties OR patient did not have access to video capability.  We continued and completed visit with audio only.  Some vital signs may be absent or patient reported.   Hearing/Vision screen Hearing Screening - Comments:: Patient is able to hear conversational tones without difficulty.  No issues reported. Vision Screening - Comments:: Followed by [facility] Wears corrective lenses They have seen their ophthalmologist in the last 12 months.    Dietary issues and exercise activities discussed: Current Exercise Habits: Home exercise routine, Intensity: Mild Low  carb diet Good water intake   Goals Addressed               This Visit's Progress     Patient Stated     Follow up with Primary Care Provider (pt-stated)        Keep all routine maintenance appointments Monitor blood sugar daily       Depression Screen PHQ 2/9 Scores 02/08/2021 11/20/2020 02/07/2020 02/04/2019 02/01/2018 08/04/2017 11/06/2016  PHQ - 2 Score 0 0 0 0 0 0 0    Fall Risk Fall Risk  02/08/2021 11/20/2020 09/20/2020 07/12/2019 06/21/2019  Falls in the past year? 0 0 0 1 1  Comment - - - - Emmi Telephone Survey: data to providers prior to load  Number falls in past yr: 0 0 - 1 1  Comment - - - - Emmi Telephone Survey Actual Response = 2  Injury with Fall? 0 0 - - 0  Follow up Falls evaluation completed Falls evaluation completed Falls evaluation completed Falls evaluation completed -    FALL RISK PREVENTION PERTAINING TO THE HOME: Handrails in use when using handrails?Yes Home free of loose throw rugs in walkways, pet beds, electrical cords, etc? Yes  Adequate lighting in your home to reduce risk of falls? Yes   ASSISTIVE DEVICES UTILIZED TO PREVENT FALLS: Life alert? No  Use of a cane, walker or w/c? No   TIMED UP AND GO: Was the test performed? No .   Cognitive Function: Patient is alert and oriented x3.  Denies difficulty focusing, making decisions, memory loss.  MMSE/6CIT deferred. Normal by direct communication/observation.  MMSE - Mini Mental State Exam 02/07/2020 02/01/2018  Not completed: Unable to complete -  Orientation to time - 5  Orientation to Place - 5  Registration - 3  Attention/ Calculation - 5  Recall - 3  Language- name 2 objects - 2  Language- repeat - 1  Language- follow 3 step command - 3  Language- read & follow direction - 1  Write a sentence - 1  Copy design - 1  Total score - 30     6CIT Screen 02/04/2019  What Year? 0 points  What month? 0 points  What time? 0 points  Count back from 20 0 points  Months in reverse 0 points   Repeat phrase 0 points  Total Score 0    Immunizations Immunization History  Administered Date(s) Administered   Influenza Whole 05/04/2017   Influenza,inj,Quad PF,6+ Mos 05/04/2017   Influenza-Unspecified 05/03/2018   Moderna Sars-Covid-2 Vaccination 08/30/2019, 09/27/2019, 04/28/2020, 10/26/2020   Pneumococcal Conjugate-13 08/01/2017   Pneumococcal Polysaccharide-23 10/26/2018   Td 07/11/2018   Zoster, Live 07/11/2013   Covid vaccine- 4 vaccines complete  Health Maintenance Health Maintenance  Topic Date Due   Zoster Vaccines- Shingrix (1 of 2) 05/11/2021 (Originally 07/09/2001)   Fecal DNA (Cologuard)  02/08/2022 (Originally 07/09/2001)   INFLUENZA VACCINE  02/25/2021   OPHTHALMOLOGY EXAM  03/26/2021   HEMOGLOBIN A1C  05/10/2021   FOOT EXAM  11/20/2021   MAMMOGRAM  12/29/2022   TETANUS/TDAP  07/11/2028   DEXA SCAN  Completed   COVID-19 Vaccine  Completed   Hepatitis C Screening  Completed   PNA vac Low Risk Adult  Completed   HPV VACCINES  Aged Out   Cologuard- deferred per patient  Shingrix vaccine- plans to receive later in the season  Lung Cancer Screening: (Low Dose CT Chest recommended if Age 56-80 years, 30 pack-year currently smoking OR have quit w/in 15years.) does not qualify.   Vision Screening: Recommended annual ophthalmology exams for early detection of glaucoma and other disorders of the eye.  Dental Screening: Recommended annual dental exams for proper oral hygiene  Community Resource Referral / Chronic Care Management: CRR required this visit?  No   CCM required this visit?  Yes     Plan:   Keep all routine maintenance appointments.   I have personally reviewed and noted the following in the patient's chart:   Medical and social history Use of alcohol, tobacco or illicit drugs  Current medications and supplements including opioid prescriptions. Patient is not currently taking opioid.  Functional ability and status Nutritional  status Physical activity Advanced directives List of other physicians Hospitalizations, surgeries, and ER visits in previous 12 months Vitals Screenings to include cognitive, depression, and falls Referrals and appointments  In addition, I have reviewed and discussed with patient certain preventive protocols, quality metrics, and best practice recommendations. A written personalized care plan for preventive services as well as general preventive health recommendations were provided to patient via mychart.     Ashok Pall, LPN   09/06/4707

## 2021-02-08 NOTE — Addendum Note (Signed)
Addended by: Varney Biles on: 02/08/2021 12:20 PM   Modules accepted: Orders

## 2021-02-08 NOTE — Chronic Care Management (AMB) (Signed)
  Chronic Care Management   Note  02/08/2021 Name: Donna Howell MRN: [id number] DOB: April 14, 1951  Donna Howell is a 70 y.o. year old female who is a primary care patient of Einar Pheasant, MD. I reached out to [name] by phone today in response to a referral sent by Donna Howell's patient's AWV (annual wellness visit) nurse, O'Brien-Blaney, Denisa L, LPN.      Donna Howell was given information about Chronic Care Management services today including:  CCM service includes personalized support from designated clinical staff supervised by her physician, including individualized plan of care and coordination with other care providers 24/7 contact phone numbers for assistance for urgent and routine care needs. Service will only be billed when office clinical staff spend 20 minutes or more in a month to coordinate care. Only one practitioner may furnish and bill the service in a calendar month. The patient may stop CCM services at any time (effective at the end of the month) by phone call to the office staff. The patient will be responsible for cost sharing (co-pay) of up to 20% of the service fee (after annual deductible is met).  Patient agreed to services and verbal consent obtained.   Follow up plan: Telephone appointment with care management team member scheduled for:02/25/2021  Noreene Larsson, [place], [place], [place] [address] Direct Dial: [phone] [email or web address]

## 2021-02-08 NOTE — Patient Instructions (Addendum)
Donna Howell , Thank you for taking time to come for your Medicare Wellness Visit. I appreciate your ongoing commitment to your health goals. Please review the following plan we discussed and let me know if I can assist you in the future.   These are the goals we discussed:  Goals       Patient Stated     Follow up with Primary Care Provider (pt-stated)      Keep all routine maintenance appointments Monitor blood sugar daily        This is a list of the screening recommended for you and due dates:  Health Maintenance  Topic Date Due   Zoster (Shingles) Vaccine (1 of 2) 05/11/2021*   Cologuard (Stool DNA test)  02/08/2022*   Flu Shot  02/25/2021   Eye exam for diabetics  03/26/2021   Hemoglobin A1C  05/10/2021   Complete foot exam   11/20/2021   Mammogram  12/29/2022   Tetanus Vaccine  07/11/2028   DEXA scan (bone density measurement)  Completed   COVID-19 Vaccine  Completed   Hepatitis C Screening: USPSTF Recommendation to screen - Ages 58-79 yo.  Completed   Pneumonia vaccines  Completed   HPV Vaccine  Aged Out  *Topic was postponed. The date shown is not the original due date.   Advanced directives: End of life planning; Advance aging; Advanced directives discussed.  Copy of current HCPOA/Living Will requested.    Conditions/risks identified: none new  Follow up in one year for your annual wellness visit    Preventive Care 65 Years and Older, Female Preventive care refers to lifestyle choices and visits with your health care provider that can promote health and wellness. What does preventive care include? A yearly physical exam. This is also called an annual well check. Dental exams once or twice a year. Routine eye exams. Ask your health care provider how often you should have your eyes checked. Personal lifestyle choices, including: Daily care of your teeth and gums. Regular physical activity. Eating a healthy diet. Avoiding tobacco and drug use. Limiting  alcohol use. Practicing safe sex. Taking low-dose aspirin every day. Taking vitamin and mineral supplements as recommended by your health care provider. What happens during an annual well check? The services and screenings done by your health care provider during your annual well check will depend on your age, overall health, lifestyle risk factors, and family history of disease. Counseling  Your health care provider may ask you questions about your: Alcohol use. Tobacco use. Drug use. Emotional well-being. Home and relationship well-being. Sexual activity. Eating habits. History of falls. Memory and ability to understand (cognition). Work and work Astronomer. Reproductive health. Screening  You may have the following tests or measurements: Height, weight, and BMI. Blood pressure. Lipid and cholesterol levels. These may be checked every 5 years, or more frequently if you are over 74 years old. Skin check. Lung cancer screening. You may have this screening every year starting at age 97 if you have a 30-pack-year history of smoking and currently smoke or have quit within the past 15 years. Fecal occult blood test (FOBT) of the stool. You may have this test every year starting at age 57. Flexible sigmoidoscopy or colonoscopy. You may have a sigmoidoscopy every 5 years or a colonoscopy every 10 years starting at age 71. Hepatitis C blood test. Hepatitis B blood test. Sexually transmitted disease (STD) testing. Diabetes screening. This is done by checking your blood sugar (glucose) after you have not  eaten for a while (fasting). You may have this done every 1-3 years. Bone density scan. This is done to screen for osteoporosis. You may have this done starting at age 55. Mammogram. This may be done every 1-2 years. Talk to your health care provider about how often you should have regular mammograms. Talk with your health care provider about your test results, treatment options, and if  necessary, the need for more tests. Vaccines  Your health care provider may recommend certain vaccines, such as: Influenza vaccine. This is recommended every year. Tetanus, diphtheria, and acellular pertussis (Tdap, Td) vaccine. You may need a Td booster every 10 years. Zoster vaccine. You may need this after age 43. Pneumococcal 13-valent conjugate (PCV13) vaccine. One dose is recommended after age 74. Pneumococcal polysaccharide (PPSV23) vaccine. One dose is recommended after age 25. Talk to your health care provider about which screenings and vaccines you need and how often you need them. This information is not intended to replace advice given to you by your health care provider. Make sure you discuss any questions you have with your health care provider. Document Released: 08/10/2015 Document Revised: 04/02/2016 Document Reviewed: 05/15/2015 Elsevier Interactive Patient Education  2017 [place] Prevention in the Home Falls can cause injuries. They can happen to people of all ages. There are many things you can do to make your home safe and to help prevent falls. What can I do on the outside of my home? Regularly fix the edges of walkways and driveways and fix any cracks. Remove anything that might make you trip as you walk through a door, such as a raised step or threshold. Trim any bushes or trees on the path to your home. Use bright outdoor lighting. Clear any walking paths of anything that might make someone trip, such as rocks or tools. Regularly check to see if handrails are loose or broken. Make sure that both sides of any steps have handrails. Any raised decks and porches should have guardrails on the edges. Have any leaves, snow, or ice cleared regularly. Use sand or salt on walking paths during winter. Clean up any spills in your garage right away. This includes oil or grease spills. What can I do in the bathroom? Use night lights. Install grab bars by the toilet  and in the tub and shower. Do not use towel bars as grab bars. Use non-skid mats or decals in the tub or shower. If you need to sit down in the shower, use a plastic, non-slip stool. Keep the floor dry. Clean up any water that spills on the floor as soon as it happens. Remove soap buildup in the tub or shower regularly. Attach bath mats securely with double-sided non-slip rug tape. Do not have throw rugs and other things on the floor that can make you trip. What can I do in the bedroom? Use night lights. Make sure that you have a light by your bed that is easy to reach. Do not use any sheets or blankets that are too big for your bed. They should not hang down onto the floor. Have a firm chair that has side arms. You can use this for support while you get dressed. Do not have throw rugs and other things on the floor that can make you trip. What can I do in the kitchen? Clean up any spills right away. Avoid walking on wet floors. Keep items that you use a lot in easy-to-reach places. If you need to reach  something above you, use a strong step stool that has a grab bar. Keep electrical cords out of the way. Do not use floor polish or wax that makes floors slippery. If you must use wax, use non-skid floor wax. Do not have throw rugs and other things on the floor that can make you trip. What can I do with my stairs? Do not leave any items on the stairs. Make sure that there are handrails on both sides of the stairs and use them. Fix handrails that are broken or loose. Make sure that handrails are as long as the stairways. Check any carpeting to make sure that it is firmly attached to the stairs. Fix any carpet that is loose or worn. Avoid having throw rugs at the top or bottom of the stairs. If you do have throw rugs, attach them to the floor with carpet tape. Make sure that you have a light switch at the top of the stairs and the bottom of the stairs. If you do not have them, ask someone to add  them for you. What else can I do to help prevent falls? Wear shoes that: Do not have high heels. Have rubber bottoms. Are comfortable and fit you well. Are closed at the toe. Do not wear sandals. If you use a stepladder: Make sure that it is fully opened. Do not climb a closed stepladder. Make sure that both sides of the stepladder are locked into place. Ask someone to hold it for you, if possible. Clearly mark and make sure that you can see: Any grab bars or handrails. First and last steps. Where the edge of each step is. Use tools that help you move around (mobility aids) if they are needed. These include: Canes. Walkers. Scooters. Crutches. Turn on the lights when you go into a dark area. Replace any light bulbs as soon as they burn out. Set up your furniture so you have a clear path. Avoid moving your furniture around. If any of your floors are uneven, fix them. If there are any pets around you, be aware of where they are. Review your medicines with your doctor. Some medicines can make you feel dizzy. This can increase your chance of falling. Ask your doctor what other things that you can do to help prevent falls. This information is not intended to replace advice given to you by your health care provider. Make sure you discuss any questions you have with your health care provider. Document Released: 05/10/2009 Document Revised: 12/20/2015 Document Reviewed: 08/18/2014 Elsevier Interactive Patient Education  2017 [employer].

## 2021-02-20 ENCOUNTER — Other Ambulatory Visit (INDEPENDENT_AMBULATORY_CARE_PROVIDER_SITE_OTHER): Payer: Medicare Other

## 2021-02-20 ENCOUNTER — Other Ambulatory Visit: Payer: Self-pay

## 2021-02-20 DIAGNOSIS — E119 Type 2 diabetes mellitus without complications: Secondary | ICD-10-CM | POA: Diagnosis not present

## 2021-02-20 DIAGNOSIS — I10 Essential (primary) hypertension: Secondary | ICD-10-CM | POA: Diagnosis not present

## 2021-02-20 DIAGNOSIS — E039 Hypothyroidism, unspecified: Secondary | ICD-10-CM

## 2021-02-20 DIAGNOSIS — E7800 Pure hypercholesterolemia, unspecified: Secondary | ICD-10-CM | POA: Diagnosis not present

## 2021-02-20 LAB — CBC WITH DIFFERENTIAL/PLATELET
Basophils Absolute: 0 10*3/uL (ref 0.0–0.1)
Basophils Relative: 1 % (ref 0.0–3.0)
Eosinophils Absolute: 0.2 10*3/uL (ref 0.0–0.7)
Eosinophils Relative: 3.3 % (ref 0.0–5.0)
HCT: 36.6 % (ref 36.0–46.0)
Hemoglobin: 12.4 g/dL (ref 12.0–15.0)
Lymphocytes Relative: 29.6 % (ref 12.0–46.0)
Lymphs Abs: 1.4 10*3/uL (ref 0.7–4.0)
MCHC: 34 g/dL (ref 30.0–36.0)
MCV: 87.9 fl (ref 78.0–100.0)
Monocytes Absolute: 0.5 10*3/uL (ref 0.1–1.0)
Monocytes Relative: 10.9 % (ref 3.0–12.0)
Neutro Abs: 2.6 10*3/uL (ref 1.4–7.7)
Neutrophils Relative %: 55.2 % (ref 43.0–77.0)
Platelets: 248 10*3/uL (ref 150.0–400.0)
RBC: 4.16 Mil/uL (ref 3.87–5.11)
RDW: 13 % (ref 11.5–15.5)
WBC: 4.8 10*3/uL (ref 4.0–10.5)

## 2021-02-20 LAB — HEPATIC FUNCTION PANEL
ALT: 17 U/L (ref 0–35)
AST: 24 U/L (ref 0–37)
Albumin: 4.4 g/dL (ref 3.5–5.2)
Alkaline Phosphatase: 55 U/L (ref 39–117)
Bilirubin, Direct: 0.2 mg/dL (ref 0.0–0.3)
Total Bilirubin: 0.7 mg/dL (ref 0.2–1.2)
Total Protein: 6.7 g/dL (ref 6.0–8.3)

## 2021-02-20 LAB — LIPID PANEL
Cholesterol: 134 mg/dL (ref 0–200)
HDL: 78.3 mg/dL (ref >39.00)
LDL Cholesterol: 41 mg/dL (ref 0–99)
NonHDL: 55.51
Total CHOL/HDL Ratio: 2
Triglycerides: 72 mg/dL (ref 0.0–149.0)
VLDL: 14.4 mg/dL (ref 0.0–40.0)

## 2021-02-20 LAB — BASIC METABOLIC PANEL
BUN: 10 mg/dL (ref 6–23)
CO2: 30 mEq/L (ref 19–32)
Calcium: 9.3 mg/dL (ref 8.4–10.5)
Chloride: 98 mEq/L (ref 96–112)
Creatinine, Ser: 0.83 mg/dL (ref 0.40–1.20)
GFR: 71.83 mL/min (ref >60.00)
Glucose, Bld: 100 mg/dL — ABNORMAL HIGH (ref 70–99)
Potassium: 4.3 mEq/L (ref 3.5–5.1)
Sodium: 134 mEq/L — ABNORMAL LOW (ref 135–145)

## 2021-02-20 LAB — TSH: TSH: 0.63 u[IU]/mL (ref 0.35–5.50)

## 2021-02-20 LAB — HEMOGLOBIN A1C: Hgb A1c MFr Bld: 6.5 % (ref 4.6–6.5)

## 2021-02-25 ENCOUNTER — Ambulatory Visit (INDEPENDENT_AMBULATORY_CARE_PROVIDER_SITE_OTHER): Payer: Medicare Other | Admitting: Pharmacist

## 2021-02-25 DIAGNOSIS — E7800 Pure hypercholesterolemia, unspecified: Secondary | ICD-10-CM | POA: Diagnosis not present

## 2021-02-25 DIAGNOSIS — E039 Hypothyroidism, unspecified: Secondary | ICD-10-CM | POA: Diagnosis not present

## 2021-02-25 DIAGNOSIS — I10 Essential (primary) hypertension: Secondary | ICD-10-CM | POA: Diagnosis not present

## 2021-02-25 DIAGNOSIS — E119 Type 2 diabetes mellitus without complications: Secondary | ICD-10-CM | POA: Diagnosis not present

## 2021-02-25 MED ORDER — METFORMIN HCL ER 500 MG PO TB24: 500.0000 mg | ORAL_TABLET | Freq: Two times a day (BID) | ORAL | 3 refills | Status: DC

## 2021-02-25 MED ORDER — BLOOD GLUCOSE MONITOR KIT: PACK | 3 refills | Status: AC

## 2021-02-25 MED ORDER — TRULICITY 0.75 MG/0.5ML ~~LOC~~ SOAJ: 0.7500 mg | SUBCUTANEOUS | 2 refills | Status: DC

## 2021-02-25 NOTE — Patient Instructions (Signed)
Donna Howell,   It was great talking to you today!  Start Trulicity 0.81 mg weekly. This medication may cause stomach upset, queasiness, or constipation, especially when first starting. This generally improves over time. Call our office if these symptoms occur and worsen, or if you have severe symptoms such as vomiting, diarrhea, or stomach pain.   Moving forward, plan to check your blood sugars 1-2 times daily, alternating between:  1) Fasting, first thing in the morning before breakfast and  2) 2 hours after your largest meal.   For a goal A1c of less than 6.5%, goal fasting readings are less than 110 and goal 2 hour after meal readings are less than 140.   This coupon should make your first month of Trulicity free.   Call me with any questions or concerns.   Donna Howell, PharmD, [place], CPP Clinical Pharmacist [place] at [facility] [phone]   Visit Information   PATIENT GOALS:   Goals Addressed               This Visit's Progress     Patient Stated     Medication Monitoring (pt-stated)        Patient Goals/Self-Care Activities Over the next 90 days, patient will:  - take medications as prescribed check glucose twice daily, document, and provide at future appointments        Consent to CCM Services: [name] was given information about Chronic Care Management services today including:  CCM service includes personalized support from designated clinical staff supervised by her physician, including individualized plan of care and coordination with other care providers 24/7 contact phone numbers for assistance for urgent and routine care needs. Service will only be billed when office clinical staff spend 20 minutes or more in a month to coordinate care. Only one practitioner may furnish and bill the service in a calendar month. The patient may stop CCM services at any time (effective at the end of the month) by phone call to the office staff. The  patient will be responsible for cost sharing (co-pay) of up to 20% of the service fee (after annual deductible is met).  Patient agreed to services and verbal consent obtained.   Patient verbalizes understanding of instructions provided today and agrees to view in [place].   Plan: Telephone follow up appointment with care management team member scheduled for:  ~ 53 weeks  Donna Howell, PharmD, [place], CPP Clinical Pharmacist [place] at [facility]: Patient Care Plan: Medication Management     Problem Identified: Diabetes, HTN, HLD      Long-Range Goal: Disease Progression Prevention   Start Date: 02/25/2021  This Visit's Progress: On track  Priority: High  Note:   Current Barriers:  Complex patient with multiple comorbidites  Pharmacist Clinical Goal(s):  Over the next 90 days, patient will maintain control of diabetes as evidenced by A1c  through collaboration with PharmD and provider.   Interventions: 1:1 collaboration with Einar Pheasant, MD regarding development and update of comprehensive plan of care as evidenced by provider attestation and co-signature Inter-disciplinary care team collaboration (see longitudinal plan of care) Comprehensive medication review performed; medication list updated in electronic medical record  SDOH: Mother and special needs son lives with her. Breakfast is the only time she is away and spending time with her husband. Daily routines are hugely impacted by working around her mother's schedule.   Diabetes: Controlled; current treatment: metformin XR 1000 mg BID;  Reports a history  of glipizide, but severe hypoglycemia Current glucose readings: fasting glucose: 90-100s, post prandial glucose: 90-100s Current meal patterns: breakfast: 3 small pancakes w/ blueberries and apples - eats out,; lunch: garden salad, apple slices; tuna fish sandwich on rye bread, apple slices; dinner: does not eat a  third meal; heavier snack - couple of graham crackers w/ apples, pretzels occasionally, soft serve ice cream in the summer; snacks: dark chocolate; drinks: water, diet soda, sugar free; feels that there are certain foods she has to avoid Current exercise: walking 5 miles outside, usually 3 miles inside- very active, has been this way for years.  Reports that she has been on metformin for 17 years, and would like to change things. Reports occasional GI upset, heartburn that she contributes to metformin. Feels like she has to remain "perfect" with her dietary choices to be able to maintain A1c. Would like A1c <6.5%.  Educated on data supporting A1c <7%, and possible harm of lower goals - though these were completed in trials where there was more use of sulfonylureas and insulin. Advised that a goal of <6.5%, if obtained safely, would be appropriate.  Counseled on SGLT2, including mechanism of action, side effects, and benefits. Discussed potential side effects of dehydration, genitourinary infections Counseled on GLP1 agonists, including mechanism of action, side effects, and benefits. No personal or family history of medullary thyroid cancer, personal history of pancreatitis or gallbladder disease. Counseled on potential side effects of nausea, stomach upset, queasiness, constipation, and that these generally improve over time.  Patient elects to start GLP1, given her goal of getting off metformin and greater projected glycemic benefit of GLP1 vs SGLT2. Given desire for weight neutrality instead of losing significant weight, preferentially would choose Trulicity or Rybelsus over Ozempic. Patient notes that Rybelsus dosing instructions may be difficult to fit into hectic routine. Start Trulicity 2.54 mg weekly. Providing with free 30 day trial sample card. Scheduled for nurse visit later this week for administration teaching.   Patient requests script for new glucometer. Provided.   Hypertension: Controlled;  current treatment: losartan 25 mg daily, metoprolol 25 mg QAM, 12.5 mg QPM;  Current home readings: will discuss moving forward.  Recommended to continue current regimen at this time  Hyperlipidemia: Controlled; current treatment: rosuvastatin 20 mg daily ;  Recommended to continue current regimen at this time  Insomnia: Controlled per patient report; current regimen: eszopiclone 1 mg QPM Reports she gets ~ 3 hours of sleep, but is not tired during the day. Feels this is appropriate and adequate for her. Avoids daytime naps Continue current regimen at this time.   Hypothyroidism: Controlled per last lab value; current regimen: levothyroxine 50 mcg daily Patient reports she has always taken about 30 minutes after breakfast. She has consistent breakfasts. If fluctuation in TSH moving forward, recommend advising to move to pre-breakfast time and wait 30 minutes before breakfast or other meals.   Patient Goals/Self-Care Activities Over the next 90 days, patient will:  - take medications as prescribed check glucose twice daily, document, and provide at future appointments  Follow Up Plan: Telephone follow up appointment with care management team member scheduled for: ~ 10 weeks

## 2021-02-25 NOTE — Chronic Care Management (AMB) (Signed)
Chronic Care Management Pharmacy Note  02/25/2021 Name:  Aleaha Fickling MRN:  [id number] DOB:  1950/10/18  Subjective: Donna Howell is an 70 y.o. year old female who is a primary patient of Einar Pheasant, MD.  The CCM team was consulted for assistance with disease management and care coordination needs.    Engaged with patient by telephone for initial visit in response to provider referral for pharmacy case management and/or care coordination services.   Consent to Services:  The patient was given information about Chronic Care Management services, agreed to services, and gave verbal consent prior to initiation of services.  Please see initial visit note for detailed documentation.   Patient Care Team: Einar Pheasant, MD as PCP - General (Internal Medicine) De Hollingshead, RPH-CPP (Pharmacist)  Recent office visits: 4/26 - PCP, continue current regimen; refer to orthopedics 7/27 labs -A1c 6.5%, eGFR 71  Objective:  Lab Results  Component Value Date   CREATININE 0.83 02/20/2021   CREATININE 0.87 11/08/2020   CREATININE 0.88 06/27/2020    Lab Results  Component Value Date   HGBA1C 6.5 02/20/2021   Last diabetic Eye exam:  Lab Results  Component Value Date/Time   HMDIABEYEEXA No Retinopathy 05/28/2016 12:00 AM    Last diabetic Foot exam:  Lab Results  Component Value Date/Time   HMDIABFOOTEX on my exam.  11/20/2020 12:00 AM        Component Value Date/Time   CHOL 134 02/20/2021 0804   TRIG 72.0 02/20/2021 0804   HDL 78.30 02/20/2021 0804   CHOLHDL 2 02/20/2021 0804   VLDL 14.4 02/20/2021 0804   LDLCALC 41 02/20/2021 0804    Hepatic Function Latest Ref Rng & Units 02/20/2021 11/08/2020 06/27/2020  Total Protein 6.0 - 8.3 g/dL 6.7 6.8 7.2  Albumin 3.5 - 5.2 g/dL 4.4 4.2 4.8  AST 0 - 37 U/L 24 26 29   ALT 0 - 35 U/L 17 21 24   Alk Phosphatase 39 - 117 U/L 55 49 52  Total Bilirubin 0.2 - 1.2 mg/dL 0.7 0.6 0.7  Bilirubin, Direct 0.0 - 0.3 mg/dL 0.2 0.1  0.1    Lab Results  Component Value Date/Time   TSH 0.63 02/20/2021 08:04 AM   TSH 1.89 03/15/2020 08:01 AM    CBC Latest Ref Rng & Units 02/20/2021 03/15/2020 11/11/2019  WBC 4.0 - 10.5 K/uL 4.8 5.3 4.5  Hemoglobin 12.0 - 15.0 g/dL 12.4 13.2 12.7  Hematocrit 36.0 - 46.0 % 36.6 38.0 36.6  Platelets 150.0 - 400.0 K/uL 248.0 249.0 242.0     Clinical ASCVD: No  The 10-year ASCVD risk score Mikey Bussing DC Jr., et al., 2013) is: 20.5%   Values used to calculate the score:     Age: 24 years     Sex: Female     Is Non-Hispanic African American: No     Diabetic: Yes     Tobacco smoker: No     Systolic Blood Pressure: 295 mmHg     Is BP treated: Yes     HDL Cholesterol: 78.3 mg/dL     Total Cholesterol: 134 mg/dL      Social History   Tobacco Use  Smoking Status Never  Smokeless Tobacco Never   BP Readings from Last 3 Encounters:  11/20/20 120/70  09/20/20 134/70  07/10/20 114/70   Pulse Readings from Last 3 Encounters:  11/20/20 83  09/20/20 75  07/10/20 65   Wt Readings from Last 3 Encounters:  02/08/21 124 lb (56.2 kg)  11/20/20  124 lb 12.8 oz (56.6 kg)  09/20/20 125 lb 12.8 oz (57.1 kg)    Assessment: Review of patient past medical history, allergies, medications, health status, including review of consultants reports, laboratory and other test data, was performed as part of comprehensive evaluation and provision of chronic care management services.   SDOH:  (Social Determinants of Health) assessments and interventions performed:  SDOH Interventions    Flowsheet Row Most Recent Value  SDOH Interventions   Financial Strain Interventions Intervention Not Indicated  Physical Activity Interventions Intervention Not Indicated       CCM Care Plan  Allergies  Allergen Reactions   Contrast Media [Iodinated Diagnostic Agents] Other (See Comments)    Infection    Buspirone Hcl Palpitations   Penicillins Rash   Percocet [Oxycodone-Acetaminophen] Rash   Percodan  [Oxycodone-Aspirin] Rash    Medications Reviewed Today     Reviewed by De Hollingshead, RPH-CPP (Pharmacist) on 02/25/21 at 19  Med List Status: <None>   Medication Order Taking? Sig Documenting Provider Last Dose Status Informant  aspirin 81 MG chewable tablet [id number] Yes Chew 81 mg by mouth daily. [provider] Taking Active   eszopiclone (LUNESTA) 1 MG TABS tablet [id number] Yes Take 1 tablet (1 mg total) by mouth at bedtime as needed for sleep. Take immediately before bedtime Einar Pheasant, MD Taking Active   glucose blood (FREESTYLE LITE) test strip [id number] Yes USE AS INSTRUCTED TO CHECK BLOOD SUGARS DAILY Einar Pheasant, MD Taking Active   losartan (COZAAR) 25 MG tablet [id number] Yes TAKE 1 TABLET DAILY Einar Pheasant, MD Taking Active   metFORMIN (GLUCOPHAGE-XR) 500 MG 24 hr tablet [id number] Yes TAKE 2 TABLETS TWICE A DAY WITH A MEAL Einar Pheasant, MD Taking Active   metoprolol succinate (TOPROL-XL) 25 MG 24 hr tablet [id number] Yes TAKE 1 TABLET IN THE MORNING AND ONE-HALF TABLET (12.5 MG) AT BEDTIME Einar Pheasant, MD Taking Active   naproxen (NAPROSYN) 500 MG tablet [id number] No Take 1 tablet (500 mg total) by mouth daily as needed.  Patient not taking: Reported on 02/25/2021   Einar Pheasant, MD Not Taking Active            Med Note Kerney Elbe Sep 20, 2020 12:01 PM) PRN  rosuvastatin (CRESTOR) 20 MG tablet [id number] Yes TAKE 1 TABLET DAILY Einar Pheasant, MD Taking Active   SYNTHROID 50 MCG tablet [id number] Yes TAKE 1 TABLET DAILY Einar Pheasant, MD Taking Active             Patient Active Problem List   Diagnosis Date Noted   Right leg pain 11/25/2020   Rectal lump 09/22/2020   Leg cramps 04/01/2020   Hypercholesterolemia 07/16/2019   Stress 07/16/2019   Environmental allergies 10/30/2018   Difficulty sleeping 06/26/2018   Fluttering heart 11/07/2017   Raynaud's disease 05/09/2017   Side pain 05/09/2017   Healthcare maintenance  12/06/2016   Back pain 11/09/2016   Colon cancer screening 11/09/2016   Diabetes mellitus without complication ([place]) 26/37/8588   Hypertension, essential 11/06/2016   Hypothyroidism 11/06/2016    Immunization History  Administered Date(s) Administered   Influenza Whole 05/04/2017   Influenza,inj,Quad PF,6+ Mos 05/04/2017   Influenza-Unspecified 05/03/2018   Moderna Sars-Covid-2 Vaccination 08/30/2019, 09/27/2019, 04/28/2020, 10/26/2020   Pneumococcal Conjugate-13 08/01/2017   Pneumococcal Polysaccharide-23 10/26/2018   Td 07/11/2018   Zoster, Live 07/11/2013    Conditions to be addressed/monitored: HTN, HLD, and DMII  Care Plan : Medication Management  Updates made by Darnelle Maffucci,  Arville Lime, RPH-CPP since 02/25/2021 12:00 AM     Problem: Diabetes, HTN, HLD      Long-Range Goal: Disease Progression Prevention   Start Date: 02/25/2021  This Visit's Progress: On track  Priority: High  Note:   Current Barriers:  Complex patient with multiple comorbidites  Pharmacist Clinical Goal(s):  Over the next 90 days, patient will maintain control of diabetes as evidenced by A1c  through collaboration with PharmD and provider.   Interventions: 1:1 collaboration with Einar Pheasant, MD regarding development and update of comprehensive plan of care as evidenced by provider attestation and co-signature Inter-disciplinary care team collaboration (see longitudinal plan of care) Comprehensive medication review performed; medication list updated in electronic medical record  SDOH: Mother and special needs son lives with her. Breakfast is the only time she is away and spending time with her husband. Daily routines are hugely impacted by working around her mother's schedule.   Diabetes: Controlled; current treatment: metformin XR 1000 mg BID;  Reports a history of glipizide, but severe hypoglycemia Current glucose readings: fasting glucose: 90-100s, post prandial glucose: 90-100s Current meal  patterns: breakfast: 3 small pancakes w/ blueberries and apples - eats out,; lunch: garden salad, apple slices; tuna fish sandwich on rye bread, apple slices; dinner: does not eat a third meal; heavier snack - couple of graham crackers w/ apples, pretzels occasionally, soft serve ice cream in the summer; snacks: dark chocolate; drinks: water, diet soda, sugar free; feels that there are certain foods she has to avoid Current exercise: walking 5 miles outside, usually 3 miles inside- very active, has been this way for years.  Reports that she has been on metformin for 17 years, and would like to change things. Reports occasional GI upset, heartburn that she contributes to metformin. Feels like she has to remain "perfect" with her dietary choices to be able to maintain A1c. Would like A1c <6.5%.  Educated on data supporting A1c <7%, and possible harm of lower goals - though these were completed in trials where there was more use of sulfonylureas and insulin. Advised that a goal of <6.5%, if obtained safely, would be appropriate.  Counseled on SGLT2, including mechanism of action, side effects, and benefits. Discussed potential side effects of dehydration, genitourinary infections Counseled on GLP1 agonists, including mechanism of action, side effects, and benefits. No personal or family history of medullary thyroid cancer, personal history of pancreatitis or gallbladder disease. Counseled on potential side effects of nausea, stomach upset, queasiness, constipation, and that these generally improve over time.  Patient elects to start GLP1, given her goal of getting off metformin and greater projected glycemic benefit of GLP1 vs SGLT2. Given desire for weight neutrality instead of losing significant weight, preferentially would choose Trulicity or Rybelsus over Ozempic. Patient notes that Rybelsus dosing instructions may be difficult to fit into hectic routine. Start Trulicity 2.42 mg weekly. Providing with free 30  day trial sample card. Scheduled for nurse visit later this week for administration teaching.   Patient requests script for new glucometer. Provided.   Hypertension: Controlled; current treatment: losartan 25 mg daily, metoprolol 25 mg QAM, 12.5 mg QPM;  Current home readings: will discuss moving forward.  Recommended to continue current regimen at this time  Hyperlipidemia: Controlled; current treatment: rosuvastatin 20 mg daily ;  Recommended to continue current regimen at this time  Insomnia: Controlled per patient report; current regimen: eszopiclone 1 mg QPM Reports she gets ~ 3 hours of sleep, but is not tired during the day.  Feels this is appropriate and adequate for her. Avoids daytime naps Continue current regimen at this time.   Hypothyroidism: Controlled per last lab value; current regimen: levothyroxine 50 mcg daily Patient reports she has always taken about 30 minutes after breakfast. She has consistent breakfasts. If fluctuation in TSH moving forward, recommend advising to move to pre-breakfast time and wait 30 minutes before breakfast or other meals.   Patient Goals/Self-Care Activities Over the next 90 days, patient will:  - take medications as prescribed check glucose twice daily, document, and provide at future appointments  Follow Up Plan: Telephone follow up appointment with care management team member scheduled for: ~ 10 weeks      Medication Assistance: None required.  Patient affirms current coverage meets needs.  Patient's preferred pharmacy is:  [id number] - [place], [place] - [place] [address] [place] [address] [place] [address] Phone: [phone] Fax: [phone]  [facility], [place] [address] [place] [address] Phone: [phone] Fax: [phone]    Follow Up:  Patient agrees to Care Plan and  Follow-up.  Plan: Telephone follow up appointment with care management team member scheduled for:  ~ 10 weeks  Catie Darnelle Maffucci, PharmD, [place], [place] Clinical Pharmacist [employer] at [employer] [phone]

## 2021-02-27 ENCOUNTER — Telehealth: Payer: Self-pay

## 2021-02-27 ENCOUNTER — Ambulatory Visit: Payer: Medicare Other

## 2021-02-27 ENCOUNTER — Ambulatory Visit: Payer: Medicare Other | Admitting: Pharmacist

## 2021-02-27 ENCOUNTER — Other Ambulatory Visit: Payer: Self-pay

## 2021-02-27 DIAGNOSIS — I10 Essential (primary) hypertension: Secondary | ICD-10-CM

## 2021-02-27 DIAGNOSIS — E119 Type 2 diabetes mellitus without complications: Secondary | ICD-10-CM

## 2021-02-27 MED ORDER — FREESTYLE LITE TEST VI STRP: ORAL_STRIP | 3 refills | Status: DC

## 2021-02-27 NOTE — Patient Instructions (Signed)
Visit Information  PATIENT GOALS:  Goals Addressed               This Visit's Progress     Patient Stated     Medication Monitoring (pt-stated)        Patient Goals/Self-Care Activities Over the next 90 days, patient will:  - take medications as prescribed check glucose twice daily, document, and provide at future appointments         Patient verbalizes understanding of instructions provided today and agrees to view in MyChart.   Plan: Telephone follow up appointment with care management team member scheduled for:  10 weeks as previously scheduled  Catie Feliz Beam, PharmD, [place], CPP Clinical Pharmacist [employer] at [employer] [phone]

## 2021-02-27 NOTE — Addendum Note (Signed)
Addended by: Lourena Simmonds on: 02/27/2021 11:14 AM   Modules accepted: Orders

## 2021-02-27 NOTE — Telephone Encounter (Signed)
Called patient. See CCM documentation.  

## 2021-02-27 NOTE — Progress Notes (Signed)
Patient education given on Trulicity and the patient expresses understanding and acceptance of instructions. Donna Howell [name] 02/27/2021 8:53 AM

## 2021-02-27 NOTE — Telephone Encounter (Signed)
Donna Howell came in today for a trulicity teaching and voiced her concerns over anxiety with possible side effects. Pt went through with the teaching and voiced no conern. Pt was able to give herself the injection today and voiced no discomfort.   Pt asks about possible side effects and what she is likely to experience. Pt wants to know what medication she may need to be prescribed to help combat the possible side effects. Pt would also like to know for how long would she stay on the lower dose before moving forward to a higher dose.

## 2021-02-27 NOTE — Chronic Care Management (AMB) (Addendum)
Chronic Care Management Pharmacy Note  02/27/2021 Name:  Donna Howell MRN:  [id number] DOB:  [birth year]  Subjective: Donna Howell is an 70 y.o. year old female who is a primary patient of Einar Pheasant, MD.  The CCM team was consulted for assistance with disease management and care coordination needs.    Engaged with patient by telephone for  medication management questions  in response to provider referral for pharmacy case management and/or care coordination services.   Consent to Services:  The patient was given information about Chronic Care Management services, agreed to services, and gave verbal consent prior to initiation of services.  Please see initial visit note for detailed documentation.   Patient Care Team: Einar Pheasant, MD as PCP - General (Internal Medicine) De Hollingshead, RPH-CPP (Pharmacist)   Objective:  Lab Results  Component Value Date   CREATININE 0.83 02/20/2021   CREATININE 0.87 11/08/2020   CREATININE 0.88 06/27/2020    Lab Results  Component Value Date   HGBA1C 6.5 02/20/2021   Last diabetic Eye exam:  Lab Results  Component Value Date/Time   HMDIABEYEEXA No Retinopathy 05/28/2016 12:00 AM    Last diabetic Foot exam:  Lab Results  Component Value Date/Time   HMDIABFOOTEX on my exam.  11/20/2020 12:00 AM        Component Value Date/Time   CHOL 134 02/20/2021 0804   TRIG 72.0 02/20/2021 0804   HDL 78.30 02/20/2021 0804   CHOLHDL 2 02/20/2021 0804   VLDL 14.4 02/20/2021 0804   LDLCALC 41 02/20/2021 0804    Hepatic Function Latest Ref Rng & Units 02/20/2021 11/08/2020 06/27/2020  Total Protein 6.0 - 8.3 g/dL 6.7 6.8 7.2  Albumin 3.5 - 5.2 g/dL 4.4 4.2 4.8  AST 0 - 37 U/L _0 ALT 0 - 35 U/L _1 Alk Phosphatase 39 - 117 U/L 55 49 52  Total Bilirubin 0.2 - 1.2 mg/dL 0.7 0.6 0.7  Bilirubin, Direct 0.0 - 0.3 mg/dL 0.2 0.1 0.1    Lab Results  Component Value Date/Time   TSH 0.63 02/20/2021 08:04 AM   TSH  1.89 03/15/2020 08:01 AM    CBC Latest Ref Rng & Units 02/20/2021 03/15/2020 11/11/2019  WBC 4.0 - 10.5 K/uL 4.8 5.3 4.5  Hemoglobin 12.0 - 15.0 g/dL 12.4 13.2 12.7  Hematocrit 36.0 - 46.0 % 36.6 38.0 36.6  Platelets 150.0 - 400.0 K/uL 248.0 249.0 242.0    No results found for: VD25OH  Clinical ASCVD: No  The 10-year ASCVD risk score Mikey Bussing DC Jr., et al., 2013) is: 20.5%   Values used to calculate the score:     Age: 56 years     Sex: Female     Is Non-Hispanic African American: No     Diabetic: Yes     Tobacco smoker: No     Systolic Blood Pressure: 539 mmHg     Is BP treated: Yes     HDL Cholesterol: 78.3 mg/dL     Total Cholesterol: 134 mg/dL      Social History   Tobacco Use  Smoking Status Never  Smokeless Tobacco Never   BP Readings from Last 3 Encounters:  11/20/20 120/70  09/20/20 134/70  07/10/20 114/70   Pulse Readings from Last 3 Encounters:  11/20/20 83  09/20/20 75  07/10/20 65   Wt Readings from Last 3 Encounters:  02/08/21 124 lb (56.2 kg)  11/20/20 124 lb 12.8 oz (56.6 kg)  09/20/20 125 lb  12.8 oz (57.1 kg)    Assessment: Review of patient past medical history, allergies, medications, health status, including review of consultants reports, laboratory and other test data, was performed as part of comprehensive evaluation and provision of chronic care management services.   SDOH:  (Social Determinants of Health) assessments and interventions performed:  SDOH Interventions    Flowsheet Row Most Recent Value  SDOH Interventions   Financial Strain Interventions Intervention Not Indicated       CCM Care Plan  Allergies  Allergen Reactions   Contrast Media [Iodinated Diagnostic Agents] Other (See Comments)    Infection    Buspirone Hcl Palpitations   Penicillins Rash   Percocet [Oxycodone-Acetaminophen] Rash   Percodan [Oxycodone-Aspirin] Rash    Medications Reviewed Today     Reviewed by De Hollingshead, RPH-CPP (Pharmacist) on  02/25/21 at 52  Med List Status: <None>   Medication Order Taking? Sig Documenting Provider Last Dose Status Informant  aspirin 81 MG chewable tablet [id number] Yes Chew 81 mg by mouth daily. [provider] Taking Active   eszopiclone (LUNESTA) 1 MG TABS tablet [id number] Yes Take 1 tablet (1 mg total) by mouth at bedtime as needed for sleep. Take immediately before bedtime Einar Pheasant, MD Taking Active   glucose blood (FREESTYLE LITE) test strip [id number] Yes USE AS INSTRUCTED TO CHECK BLOOD SUGARS DAILY Einar Pheasant, MD Taking Active   losartan (COZAAR) 25 MG tablet [id number] Yes TAKE 1 TABLET DAILY Einar Pheasant, MD Taking Active   metFORMIN (GLUCOPHAGE-XR) 500 MG 24 hr tablet [id number] Yes TAKE 2 TABLETS TWICE A DAY WITH A MEAL Einar Pheasant, MD Taking Active   metoprolol succinate (TOPROL-XL) 25 MG 24 hr tablet [id number] Yes TAKE 1 TABLET IN THE MORNING AND ONE-HALF TABLET (12.5 MG) AT BEDTIME Einar Pheasant, MD Taking Active   naproxen (NAPROSYN) 500 MG tablet [id number] No Take 1 tablet (500 mg total) by mouth daily as needed.  Patient not taking: Reported on 02/25/2021   Einar Pheasant, MD Not Taking Active            Med Note Kerney Elbe Sep 20, 2020 12:01 PM) PRN  rosuvastatin (CRESTOR) 20 MG tablet [id number] Yes TAKE 1 TABLET DAILY Einar Pheasant, MD Taking Active   SYNTHROID 50 MCG tablet [id number] Yes TAKE 1 TABLET DAILY Einar Pheasant, MD Taking Active             Patient Active Problem List   Diagnosis Date Noted   Right leg pain 11/25/2020   Rectal lump 09/22/2020   Leg cramps 04/01/2020   Hypercholesterolemia 07/16/2019   Stress 07/16/2019   Environmental allergies 10/30/2018   Difficulty sleeping 06/26/2018   Fluttering heart 11/07/2017   Raynaud's disease 05/09/2017   Side pain 05/09/2017   Healthcare maintenance 12/06/2016   Back pain 11/09/2016   Colon cancer screening 11/09/2016   Diabetes mellitus without complication ([place])  68/06/7516   Hypertension, essential 11/06/2016   Hypothyroidism 11/06/2016    Immunization History  Administered Date(s) Administered   Influenza Whole 05/04/2017   Influenza,inj,Quad PF,6+ Mos 05/04/2017   Influenza-Unspecified 05/03/2018   Moderna Sars-Covid-2 Vaccination 08/30/2019, 09/27/2019, 04/28/2020, 10/26/2020   Pneumococcal Conjugate-13 08/01/2017   Pneumococcal Polysaccharide-23 10/26/2018   Td 07/11/2018   Zoster, Live 07/11/2013    Conditions to be addressed/monitored: HTN, HLD, and DMII  Care Plan : Medication Management  Updates made by De Hollingshead, RPH-CPP since 02/27/2021 12:00 AM     Problem: Diabetes, HTN, HLD  Long-Range Goal: Disease Progression Prevention   Start Date: 02/25/2021  This Visit's Progress: On track  Recent Progress: On track  Priority: High  Note:   Current Barriers:  Complex patient with multiple comorbidites  Pharmacist Clinical Goal(s):  Over the next 90 days, patient will maintain control of diabetes as evidenced by A1c  through collaboration with PharmD and provider.   Interventions: 1:1 collaboration with Einar Pheasant, MD regarding development and update of comprehensive plan of care as evidenced by provider attestation and co-signature Inter-disciplinary care team collaboration (see longitudinal plan of care) Comprehensive medication review performed; medication list updated in electronic medical record  SDOH: Mother and special needs son lives with her. Breakfast is the only time she is away and spending time with her husband. Daily routines are hugely impacted by working around her mother's schedule.   Diabetes: Controlled; current treatment: metformin XR 1000 mg BID;  Reports a history of glipizide, but severe hypoglycemia Current glucose readings: fasting glucose: 90-100s, post prandial glucose: 90-100s Current meal patterns: breakfast: 3 small pancakes w/ blueberries and apples - eats out,; lunch: garden  salad, apple slices; tuna fish sandwich on rye bread, apple slices; dinner: does not eat a third meal; heavier snack - couple of graham crackers w/ apples, pretzels occasionally, soft serve ice cream in the summer; snacks: dark chocolate; drinks: water, diet soda, sugar free; feels that there are certain foods she has to avoid Current exercise: walking 5 miles outside, usually 3 miles inside- very active, has been this way for years.  Received education from [place] today regarding Trulicity. Reports that she read the package insert and is very concerned about side effects. She worries about her baseline IBS (does report a remote history of GI work up for this) and what to do if she has diarrhea or constipation. She reports that she has loperamide to use for diarrhea, but asks what to do if she experiences constipation. Discussed increasing hydration, fiber intake, and physical activity to combat constipation. If continued concerns, can consider adding metamucil or miralax PRN. Patient verbalized understanding.  Also requested that we resent FreeStyle Lite test strips script for TID testing. Resent today.   Hypertension: Controlled; current treatment: losartan 25 mg daily, metoprolol 25 mg QAM, 12.5 mg QPM;  Current home readings: will discuss moving forward.  Previously recommended to continue current regimen at this time  Hyperlipidemia: Controlled; current treatment: rosuvastatin 20 mg daily ;  Previously recommended to continue current regimen at this time  Insomnia: Controlled per patient report; current regimen: eszopiclone 1 mg QPM Reports she gets ~ 3 hours of sleep, but is not tired during the day. Feels this is appropriate and adequate for her. Avoids daytime naps Previously recommended to continue current regimen at this time.   Hypothyroidism: Controlled per last lab value; current regimen: levothyroxine 50 mcg daily Previously discussed that patient takes about 30 minutes after  breakfast. She has consistent breakfasts. If fluctuation in TSH moving forward, recommend advising to move to pre-breakfast time and wait 30 minutes before breakfast or other meals. Previously recommended to continue current regimen at this time   Patient Goals/Self-Care Activities Over the next 90 days, patient will:  - take medications as prescribed check glucose twice daily, document, and provide at future appointments  Follow Up Plan: Telephone follow up appointment with care management team member scheduled for: ~ 10 weeks as previously scheduled      Medication Assistance: None required.  Patient affirms current coverage meets needs.  Patient's  preferred pharmacy is:  [facility] [place], [place] - [place] AT [place] [address] [place] [address] Phone: [phone] Fax: [phone]  [facility] [place], [place] [address] [place] [address] Phone: [phone] Fax: [phone]    Follow Up:  Patient agrees to Care Plan and Follow-up.  Plan: Telephone follow up appointment with care management team member scheduled for:  10 weeks as previously scheduled  Catie Darnelle Maffucci, PharmD, [place], [place] Clinical Pharmacist [employer] at [employer] [phone]

## 2021-03-08 ENCOUNTER — Telehealth: Payer: Self-pay | Admitting: Internal Medicine

## 2021-03-08 ENCOUNTER — Ambulatory Visit: Payer: Medicare Other | Admitting: Pharmacist

## 2021-03-08 DIAGNOSIS — E119 Type 2 diabetes mellitus without complications: Secondary | ICD-10-CM

## 2021-03-08 DIAGNOSIS — E039 Hypothyroidism, unspecified: Secondary | ICD-10-CM

## 2021-03-08 DIAGNOSIS — I10 Essential (primary) hypertension: Secondary | ICD-10-CM

## 2021-03-08 NOTE — Telephone Encounter (Signed)
Patient would like a call about the indigestion she is having from her trulicity.

## 2021-03-08 NOTE — Telephone Encounter (Signed)
Patient spoke with Catie this morning and was advised to d/c trulicity due to indigestion. Patient wanted to know what she could use over the counter to help with symptoms until trulicity is out of her system. Per Catie, patient advised to used pepcid 20 mg as directed and to call back if symptoms do not resolve. Confirmed pt doing ok otherwise. No chest pain, sob, etc.

## 2021-03-08 NOTE — Chronic Care Management (AMB) (Signed)
**Note Donna-Identified via Obfuscation** Chronic Care Management Pharmacy Note  03/08/2021 Name:  Donna Howell MRN:  [id number] DOB:  24-Jan-1951  Subjective: Donna Howell is an 70 y.o. year old female who is a primary patient of Donna Pheasant, MD.  The CCM team was consulted for assistance with disease management and care coordination needs.    Engaged with patient by telephone for  medication question  in response to provider referral for pharmacy case management and/or care coordination services.   Consent to Services:  The patient was given information about Chronic Care Management services, agreed to services, and gave verbal consent prior to initiation of services.  Please see initial visit note for detailed documentation.   Patient Care Team: Donna Pheasant, MD as PCP - General (Internal Medicine) Donna Howell, RPH-CPP (Pharmacist)   Objective:  Lab Results  Component Value Date   CREATININE 0.83 02/20/2021   CREATININE 0.87 11/08/2020   CREATININE 0.88 06/27/2020    Lab Results  Component Value Date   HGBA1C 6.5 02/20/2021   Last diabetic Eye exam:  Lab Results  Component Value Date/Time   HMDIABEYEEXA No Retinopathy 05/28/2016 12:00 AM    Last diabetic Foot exam:  Lab Results  Component Value Date/Time   HMDIABFOOTEX on my exam.  11/20/2020 12:00 AM        Component Value Date/Time   CHOL 134 02/20/2021 0804   TRIG 72.0 02/20/2021 0804   HDL 78.30 02/20/2021 0804   CHOLHDL 2 02/20/2021 0804   VLDL 14.4 02/20/2021 0804   LDLCALC 41 02/20/2021 0804    Hepatic Function Latest Ref Rng & Units 02/20/2021 11/08/2020 06/27/2020  Total Protein 6.0 - 8.3 g/dL 6.7 6.8 7.2  Albumin 3.5 - 5.2 g/dL 4.4 4.2 4.8  AST 0 - 37 U/L _0 ALT 0 - 35 U/L _1 Alk Phosphatase 39 - 117 U/L 55 49 52  Total Bilirubin 0.2 - 1.2 mg/dL 0.7 0.6 0.7  Bilirubin, Direct 0.0 - 0.3 mg/dL 0.2 0.1 0.1    Lab Results  Component Value Date/Time   TSH 0.63 02/20/2021 08:04 AM   TSH 1.89  03/15/2020 08:01 AM    CBC Latest Ref Rng & Units 02/20/2021 03/15/2020 11/11/2019  WBC 4.0 - 10.5 K/uL 4.8 5.3 4.5  Hemoglobin 12.0 - 15.0 g/dL 12.4 13.2 12.7  Hematocrit 36.0 - 46.0 % 36.6 38.0 36.6  Platelets 150.0 - 400.0 K/uL 248.0 249.0 242.0    No results found for: VD25OH  Clinical ASCVD: No  The 10-year ASCVD risk score Donna Bussing DC Jr., et al., 2013) is: 20.5%   Values used to calculate the score:     Age: 31 years     Sex: Female     Is Non-Hispanic African American: No     Diabetic: Yes     Tobacco smoker: No     Systolic Blood Pressure: 412 mmHg     Is BP treated: Yes     HDL Cholesterol: 78.3 mg/dL     Total Cholesterol: 134 mg/dL      Social History   Tobacco Use  Smoking Status Never  Smokeless Tobacco Never   BP Readings from Last 3 Encounters:  11/20/20 120/70  09/20/20 134/70  07/10/20 114/70   Pulse Readings from Last 3 Encounters:  11/20/20 83  09/20/20 75  07/10/20 65   Wt Readings from Last 3 Encounters:  02/08/21 124 lb (56.2 kg)  11/20/20 124 lb 12.8 oz (56.6 kg)  09/20/20 125 lb 12.8  oz (57.1 kg)    Assessment: Review of patient past medical history, allergies, medications, health status, including review of consultants reports, laboratory and other test data, was performed as part of comprehensive evaluation and provision of chronic care management services.   SDOH:  (Social Determinants of Health) assessments and interventions performed:  SDOH Interventions    Flowsheet Row Most Recent Value  SDOH Interventions   Financial Strain Interventions Intervention Not Indicated       CCM Care Plan  Allergies  Allergen Reactions   Contrast Media [Iodinated Diagnostic Agents] Other (See Comments)    Infection    Buspirone Hcl Palpitations   Penicillins Rash   Percocet [Oxycodone-Acetaminophen] Rash   Percodan [Oxycodone-Aspirin] Rash    Medications Reviewed Today     Reviewed by Donna Howell, RPH-CPP (Pharmacist) on 02/25/21  at 10  Med List Status: <None>   Medication Order Taking? Sig Documenting Provider Last Dose Status Informant  aspirin 81 MG chewable tablet [id number] Yes Chew 81 mg by mouth daily. [provider] Taking Active   eszopiclone (LUNESTA) 1 MG TABS tablet [id number] Yes Take 1 tablet (1 mg total) by mouth at bedtime as needed for sleep. Take immediately before bedtime Donna Pheasant, MD Taking Active   glucose blood (FREESTYLE LITE) test strip [id number] Yes USE AS INSTRUCTED TO CHECK BLOOD SUGARS DAILY Donna Pheasant, MD Taking Active   losartan (COZAAR) 25 MG tablet [id number] Yes TAKE 1 TABLET DAILY Donna Pheasant, MD Taking Active   metFORMIN (GLUCOPHAGE-XR) 500 MG 24 hr tablet [id number] Yes TAKE 2 TABLETS TWICE A DAY WITH A MEAL Donna Pheasant, MD Taking Active   metoprolol succinate (TOPROL-XL) 25 MG 24 hr tablet [id number] Yes TAKE 1 TABLET IN THE MORNING AND ONE-HALF TABLET (12.5 MG) AT BEDTIME Donna Pheasant, MD Taking Active   naproxen (NAPROSYN) 500 MG tablet [id number] No Take 1 tablet (500 mg total) by mouth daily as needed.  Patient not taking: Reported on 02/25/2021   Donna Pheasant, MD Not Taking Active            Med Note Donna Howell Sep 20, 2020 12:01 PM) PRN  rosuvastatin (CRESTOR) 20 MG tablet [id number] Yes TAKE 1 TABLET DAILY Donna Pheasant, MD Taking Active   SYNTHROID 50 MCG tablet [id number] Yes TAKE 1 TABLET DAILY Donna Pheasant, MD Taking Active             Patient Active Problem List   Diagnosis Date Noted   Right leg pain 11/25/2020   Rectal lump 09/22/2020   Leg cramps 04/01/2020   Hypercholesterolemia 07/16/2019   Stress 07/16/2019   Environmental allergies 10/30/2018   Difficulty sleeping 06/26/2018   Fluttering heart 11/07/2017   Raynaud's disease 05/09/2017   Side pain 05/09/2017   Healthcare maintenance 12/06/2016   Back pain 11/09/2016   Colon cancer screening 11/09/2016   Diabetes mellitus without complication ([place])  02/40/9735   Hypertension, essential 11/06/2016   Hypothyroidism 11/06/2016    Immunization History  Administered Date(s) Administered   Influenza Whole 05/04/2017   Influenza,inj,Quad PF,6+ Mos 05/04/2017   Influenza-Unspecified 05/03/2018   Moderna Sars-Covid-2 Vaccination 08/30/2019, 09/27/2019, 04/28/2020, 10/26/2020   Pneumococcal Conjugate-13 08/01/2017   Pneumococcal Polysaccharide-23 10/26/2018   Td 07/11/2018   Zoster, Live 07/11/2013    Conditions to be addressed/monitored: HTN, HLD, and DMII  Care Plan : Medication Management  Updates made by Donna Howell, RPH-CPP since 03/08/2021 12:00 AM     Problem: Diabetes, HTN, HLD  Long-Range Goal: Disease Progression Prevention   Start Date: 02/25/2021  This Visit's Progress: On track  Recent Progress: On track  Priority: High  Note:   Current Barriers:  Complex patient with multiple comorbidites  Pharmacist Clinical Goal(s):  Over the next 90 days, patient will maintain control of diabetes as evidenced by A1c  through collaboration with PharmD and provider.   Interventions: 1:1 collaboration with Donna Pheasant, MD regarding development and update of comprehensive plan of care as evidenced by provider attestation and co-signature Inter-disciplinary care team collaboration (see longitudinal plan of care) Comprehensive medication review performed; medication list updated in electronic medical record  SDOH: Mother and special needs son lives with her. Breakfast is the only time she is away and spending time with her husband. Daily routines are hugely impacted by working around her mother's schedule.   Diabetes: Controlled; current treatment: metformin XR 1000 mg BID; Trulicity 4.53 mg weekly Reports a history of glipizide, but severe hypoglycemia Reports that she took her second dose of Trulicity on Wednesday and has experienced severe indigestion, including nausea, burping. Very uncomfortable and notes this  is negative for her quality of life.  Discontinue Trulicity. Follow up with PCP at upcoming visit.   Hypertension: Controlled; current treatment: losartan 25 mg daily, metoprolol 25 mg QAM, 12.5 mg QPM;  Current home readings: will discuss moving forward.  Previously recommended to continue current regimen at this time  Hyperlipidemia: Controlled; current treatment: rosuvastatin 20 mg daily ;  Previously recommended to continue current regimen at this time  Insomnia: Controlled per patient report; current regimen: eszopiclone 1 mg QPM Reports she gets ~ 3 hours of sleep, but is not tired during the day. Feels this is appropriate and adequate for her. Avoids daytime naps Previously recommended to continue current regimen at this time.   Hypothyroidism: Controlled per last lab value; current regimen: levothyroxine 50 mcg daily Previously discussed that patient takes about 30 minutes after breakfast. She has consistent breakfasts. If fluctuation in TSH moving forward, recommend advising to move to pre-breakfast time and wait 30 minutes before breakfast or other meals. Previously recommended to continue current regimen at this time   Patient Goals/Self-Care Activities Over the next 90 days, patient will:  - take medications as prescribed check glucose twice daily, document, and provide at future appointments  Follow Up Plan: Telephone follow up appointment with care management team member scheduled for: ~ 8 weeks as previously scheduled      Medication Assistance: None required.  Patient affirms current coverage meets needs.  Patient's preferred pharmacy is:  [id number] - [place], [place] - [place] AT [place] [address] [place] [address] Phone: [phone] Fax: [phone]  [facility] [place], [place] [address] [place] [address] Phone:  [phone] Fax: [phone]    Follow Up:  Patient agrees to Care Plan and Follow-up.  Plan: Telephone follow up appointment with care management team member scheduled for:  8 weeks as previously scheduled  Catie Darnelle Maffucci, PharmD, [place], [place] Clinical Pharmacist [employer] at [employer] [phone]

## 2021-03-08 NOTE — Patient Instructions (Signed)
Visit Information  PATIENT GOALS:  Goals Addressed               This Visit's Progress     Patient Stated     Medication Monitoring (pt-stated)        Patient Goals/Self-Care Activities Over the next 90 days, patient will:  - take medications as prescribed check glucose twice daily, document, and provide at future appointments        Patient verbalizes understanding of instructions provided today and agrees to view in MyChart.    Plan: Telephone follow up appointment with care management team member scheduled for:  8 weeks as previously scheduled  Catie Feliz Beam, PharmD, [place], CPP Clinical Pharmacist [employer] at [employer] [phone]

## 2021-03-14 ENCOUNTER — Telehealth: Payer: Self-pay | Admitting: Internal Medicine

## 2021-03-14 MED ORDER — ESZOPICLONE 1 MG PO TABS: 1.0000 mg | ORAL_TABLET | Freq: Every evening | ORAL | 1 refills | Status: DC | PRN

## 2021-03-14 NOTE — Telephone Encounter (Signed)
RX Refill:Lunesta Last Seen:01-08-21 Last ordered:01-18-21

## 2021-03-14 NOTE — Telephone Encounter (Signed)
Rx ok'd for lunesta #30 1 refill.

## 2021-03-14 NOTE — Telephone Encounter (Signed)
Sarah from [facility] is calling to request the patients eszopiclone (LUNESTA) 1 MG TABS tablet.

## 2021-03-26 ENCOUNTER — Other Ambulatory Visit: Payer: Self-pay

## 2021-03-26 ENCOUNTER — Telehealth: Payer: Self-pay

## 2021-03-26 ENCOUNTER — Encounter: Payer: Self-pay | Admitting: Internal Medicine

## 2021-03-26 ENCOUNTER — Ambulatory Visit (INDEPENDENT_AMBULATORY_CARE_PROVIDER_SITE_OTHER): Payer: Medicare Other | Admitting: Internal Medicine

## 2021-03-26 VITALS — BP 112/70 | HR 76 | Temp 97.7°F | Resp 16 | Ht 65.0 in | Wt 122.2 lb

## 2021-03-26 DIAGNOSIS — F439 Reaction to severe stress, unspecified: Secondary | ICD-10-CM | POA: Diagnosis not present

## 2021-03-26 DIAGNOSIS — R928 Other abnormal and inconclusive findings on diagnostic imaging of breast: Secondary | ICD-10-CM

## 2021-03-26 DIAGNOSIS — E1165 Type 2 diabetes mellitus with hyperglycemia: Secondary | ICD-10-CM

## 2021-03-26 DIAGNOSIS — E119 Type 2 diabetes mellitus without complications: Secondary | ICD-10-CM

## 2021-03-26 DIAGNOSIS — Z1211 Encounter for screening for malignant neoplasm of colon: Secondary | ICD-10-CM

## 2021-03-26 DIAGNOSIS — E7800 Pure hypercholesterolemia, unspecified: Secondary | ICD-10-CM | POA: Diagnosis not present

## 2021-03-26 DIAGNOSIS — E039 Hypothyroidism, unspecified: Secondary | ICD-10-CM

## 2021-03-26 DIAGNOSIS — I10 Essential (primary) hypertension: Secondary | ICD-10-CM | POA: Diagnosis not present

## 2021-03-26 NOTE — Progress Notes (Addendum)
Patient ID: Donna Howell, female   DOB: 09-10-1950, 70 y.o.   MRN: [id number]   Subjective:    Patient ID: Donna Howell, female    DOB: 1950-10-14, 70 y.o.   MRN: [id number]  This visit occurred during the SARS-CoV-2 public health emergency.  Safety protocols were in place, including screening questions prior to the visit, additional usage of staff PPE, and extensive cleaning of exam room while observing appropriate contact time as indicated for disinfecting solutions.   Patient here for a scheduled follow up.   Chief Complaint  Patient presents with   Diabetes   Hypertension   Hyperlipidemia   .   HPI Concerns regarding metformin. She has a history of diabetes.  Is on metformin.  A1c stable at 6.5. she has been concerned a1c - not lower.  Watches her diet.  Exercises.  She is also concerned regarding being on metformin - especially 2062m per day.  Some nausea and increased burping.  She relates (or is questioning if related to metformin).  Wants to decrease or change metformin.  Did not tolerate trulicity due to indigesion.  Was questioning starting ozempic.  She is eating.  Exercises regularly - walking 80-90 min per day.  Takes fibercon to help regulate bowels.     Past Medical History:  Diagnosis Date   Arthritis    Chicken pox    Diabetes (HCC)    Frequent headaches    GERD (gastroesophageal reflux disease)    Hay fever    Heart murmur    High blood pressure    History of bronchitis    Hypothyroidism    Past Surgical History:  Procedure Laterality Date   APPENDECTOMY  1974   BREAST BIOPSY Left 01/16/2021   Affirm bx-"Ribbon" clip-path pending   TONSILLECTOMY AND ADENOIDECTOMY  1962   Family History  Problem Relation Age of Onset   Arthritis Mother    Heart disease Mother    Diabetes Mother    Stroke Father    Arthritis Sister    Breast cancer Sister [phone]  Alcoholism Brother    Arthritis Maternal Grandmother    Heart disease Maternal Grandmother     Diabetes Maternal Grandmother    Social History   Socioeconomic History   Marital status: Married    Spouse name: Not on file   Number of children: Not on file   Years of education: Not on file   Highest education level: Not on file  Occupational History   Not on file  Tobacco Use   Smoking status: Never   Smokeless tobacco: Never  Substance and Sexual Activity   Alcohol use: No   Drug use: No   Sexual activity: Not on file  Other Topics Concern   Not on file  Social History Narrative   Not on file   Social Determinants of Health   Financial Resource Strain: Low Risk    Difficulty of Paying Living Expenses: Not hard at all  Food Insecurity: No Food Insecurity   Worried About RCharity fundraiserin the Last Year: Never true   RCadizin the Last Year: Never true  Transportation Needs: No Transportation Needs   Lack of Transportation (Medical): No   Lack of Transportation (Non-Medical): No  Physical Activity: Sufficiently Active   Days of Exercise per Week: 7 days   Minutes of Exercise per Session: 60 min  Stress: No Stress Concern Present   Feeling of Stress : Not at all  Social  Connections: Unknown   Frequency of Communication with Friends and Family: Not on file   Frequency of Social Gatherings with Friends and Family: Not on file   Attends Religious Services: Not on file   Active Member of Clubs or Organizations: Not on file   Attends Archivist Meetings: Not on file   Marital Status: Married    Review of Systems  Constitutional:  Negative for appetite change and unexpected weight change.  HENT:  Negative for congestion and sinus pressure.   Respiratory:  Negative for cough, chest tightness and shortness of breath.   Cardiovascular:  Negative for chest pain, palpitations and leg swelling.  Gastrointestinal:  Positive for nausea. Negative for abdominal pain and vomiting.  Genitourinary:  Negative for difficulty urinating and dysuria.   Musculoskeletal:  Negative for joint swelling and myalgias.  Skin:  Negative for color change and rash.  Neurological:  Negative for dizziness, light-headedness and headaches.  Psychiatric/Behavioral:  Negative for agitation and dysphoric mood.       Objective:     BP 112/70   Pulse 76   Temp 97.7 F (36.5 C)   Resp 16   Ht 5' 5"  (1.651 m)   Wt 122 lb 3.2 oz (55.4 kg)   SpO2 98%   BMI 20.34 kg/m  Wt Readings from Last 3 Encounters:  03/26/21 122 lb 3.2 oz (55.4 kg)  02/08/21 124 lb (56.2 kg)  11/20/20 124 lb 12.8 oz (56.6 kg)    Physical Exam Vitals reviewed.  Constitutional:      General: She is not in acute distress.    Appearance: Normal appearance.  HENT:     Head: Normocephalic and atraumatic.     Right Ear: External ear normal.     Left Ear: External ear normal.  Eyes:     General: No scleral icterus.       Right eye: No discharge.        Left eye: No discharge.     Conjunctiva/sclera: Conjunctivae normal.  Neck:     Thyroid: No thyromegaly.  Cardiovascular:     Rate and Rhythm: Normal rate and regular rhythm.  Pulmonary:     Effort: No respiratory distress.     Breath sounds: Normal breath sounds. No wheezing.  Abdominal:     General: Bowel sounds are normal.     Palpations: Abdomen is soft.     Tenderness: There is no abdominal tenderness.  Musculoskeletal:        General: No swelling or tenderness.     Cervical back: Neck supple. No tenderness.  Lymphadenopathy:     Cervical: No cervical adenopathy.  Skin:    Findings: No erythema or rash.  Neurological:     Mental Status: She is alert.  Psychiatric:        Mood and Affect: Mood normal.        Behavior: Behavior normal.     Outpatient Encounter Medications as of 03/26/2021  Medication Sig   aspirin 81 MG chewable tablet Chew 81 mg by mouth daily.   blood glucose meter kit and supplies KIT Dispense based on patient and insurance preference. Use up to four times daily as directed.    eszopiclone (LUNESTA) 1 MG TABS tablet Take 1 tablet (1 mg total) by mouth at bedtime as needed for sleep. Take immediately before bedtime   glucose blood (FREESTYLE LITE) test strip Use to check blood sugars three times daily   losartan (COZAAR) 25 MG tablet TAKE 1 TABLET  DAILY   metFORMIN (GLUCOPHAGE-XR) 500 MG 24 hr tablet Take 1 tablet (500 mg total) by mouth 2 (two) times daily.   metoprolol succinate (TOPROL-XL) 25 MG 24 hr tablet TAKE 1 TABLET IN THE MORNING AND ONE-HALF TABLET (12.5 MG) AT BEDTIME   rosuvastatin (CRESTOR) 20 MG tablet TAKE 1 TABLET DAILY   SYNTHROID 50 MCG tablet TAKE 1 TABLET DAILY   [DISCONTINUED] naproxen (NAPROSYN) 500 MG tablet Take 1 tablet (500 mg total) by mouth daily as needed. (Patient not taking: Reported on 02/25/2021)   No facility-administered encounter medications on file as of 03/26/2021.     Lab Results  Component Value Date   WBC 4.8 02/20/2021   HGB 12.4 02/20/2021   HCT 36.6 02/20/2021   PLT 248.0 02/20/2021   GLUCOSE 100 (H) 02/20/2021   CHOL 134 02/20/2021   TRIG 72.0 02/20/2021   HDL 78.30 02/20/2021   LDLCALC 41 02/20/2021   ALT 17 02/20/2021   AST 24 02/20/2021   NA 134 (L) 02/20/2021   K 4.3 02/20/2021   CL 98 02/20/2021   CREATININE 0.83 02/20/2021   BUN 10 02/20/2021   CO2 30 02/20/2021   TSH 0.63 02/20/2021   HGBA1C 6.5 02/20/2021   MICROALBUR <0.7 11/08/2020    MM CLIP PLACEMENT LEFT  Result Date: 01/16/2021 CLINICAL DATA:  Status post stereotactic biopsy EXAM: DIAGNOSTIC LEFT MAMMOGRAM POST STEREOTACTIC BIOPSY COMPARISON:  Previous exam(s). FINDINGS: Mammographic images were obtained following stereotactic guided biopsy of a LEFT breast asymmetry with possible associated distortion. The RIBBON biopsy marking clip is along the medial aspect of the asymmetry. IMPRESSION: Appropriate positioning of the RIBBON shaped biopsy marking clip at the site of biopsy in the LEFT upper outer breast. Final Assessment: Post Procedure  Mammograms for Marker Placement Electronically Signed   By: Valentino Saxon MD   On: 01/16/2021 10:53  MM LT BREAST BX W LOC DEV 1ST LESION IMAGE BX SPEC STEREO GUIDE  Addendum Date: 01/17/2021   ADDENDUM REPORT: 01/17/2021 11:53 ADDENDUM: PATHOLOGY revealed: A. BREAST ASYMMETRY, LEFT UPPER OUTER; STEREOTACTIC BIOPSY: - PREDOMINANTLY BENIGN ADIPOSE TISSUE. - AREAS OF STROMAL FIBROSIS AND MAMMARY GLANDS WITH ATROPHIC AND APOCRINE CHANGES. - NEGATIVE FOR ATYPIA AND MALIGNANCY. Pathology results are CONCORDANT with imaging findings, per Dr. Valentino Saxon. 12 stereotactic cores were obtained at time of biopsy. Pathology results and recommendations were discussed with patient via telephone on 01/17/2021. Patient reported doing well after the biopsy with no adverse symptoms, and only slight tenderness at the site. Post biopsy care instructions were reviewed, questions were answered and my direct phone number was provided. Patient was instructed to call [facility] for any additional questions or concerns related to biopsy site. Recommendation: Patient instructed to continue monthly self breast examinations, clinical follow-up with provider with any subsequent concerns, and return for annual bilateral screening mammogram next year due June 2023. Pathology results reported by Electa Sniff RN on 01/17/2021. Electronically Signed   By: Valentino Saxon MD   On: 01/17/2021 11:53   Result Date: 01/17/2021 CLINICAL DATA:  Possible asymmetry with questionable architectural distortion EXAM: LEFT BREAST STEREOTACTIC CORE NEEDLE BIOPSY COMPARISON:  Previous exams. FINDINGS: The patient and I discussed the procedure of stereotactic-guided biopsy including benefits and alternatives. We discussed the high likelihood of a successful procedure. We discussed the risks of the procedure including infection, bleeding, tissue injury, clip migration, and inadequate sampling. Informed written consent was given. The usual  time out protocol was performed immediately prior to the procedure. Using sterile  technique and 1% lidocaine and 1% lidocaine with epinephrine as local anesthetic, under stereotactic guidance, a 9 gauge vacuum assisted device was used to perform core needle biopsy of an asymmetry in the LEFT upper outer quadrant using a LMO approach. Specimen radiograph was performed showing representative tissue. Lesion quadrant: Upper outer quadrant At the conclusion of the procedure, a RIBBON tissue marker clip was deployed into the biopsy cavity. Follow-up 2-view mammogram was performed and dictated separately. IMPRESSION: Stereotactic-guided biopsy of a LEFT breast asymmetry. No apparent complications. Electronically Signed: By: Valentino Saxon MD On: 01/16/2021 11:07      Assessment & Plan:   Problem List Items Addressed This Visit     Abnormal mammogram    S/p breast biopsy 12/2020.  Benign.  Surgery recommended f/u annual screening mammogram.       Colon cancer screening    cologuard has been mailed to her.       Hypercholesterolemia    Continue crestor.  Follow lipid panel and liver function tests.        Relevant Orders   Hepatic function panel   Lipid panel   Hypertension, essential - Primary    Continue losartan and metoprolol.  Blood pressure doing well.  Follow pressures.  Follow metabolic panel.       Relevant Orders   Basic metabolic panel   Hypothyroidism    On thyroid replacement.  Follow tsh.       Stress    Increased stress.  Discussed.  She desires no further intervention at this time.  Follow.       Type 2 diabetes mellitus with hyperglycemia (HCC)    On metformin.  Concerned regarding remaining on metformin ( or at least on metformin at this dose).  GI symptoms as outlined.  Did not tolerate trulicity.  She was questioning ozempic.  Discussed side effects and tolerance.  She is exercising and watches her diet.  Discussed treatment options.  Trial of rybelsus.  Discussed  proper time to take medication and possible side effects.  Will decrease metformin to 534m bid.  Follow.  Call with update.  Follow met b and a1c.         CEinar Pheasant MD

## 2021-03-26 NOTE — Telephone Encounter (Signed)
Samples of Rybelsus 3 mg were given to the patient, quantity 1 box, Lot Number [id number]. EXP 05/27/2022

## 2021-03-26 NOTE — Telephone Encounter (Signed)
Since she feels the higher dose of the metformin contributes to some of the GI symptoms, I am ok for her to decrease metformin to 500mg  bid and start rybelsus.

## 2021-03-31 ENCOUNTER — Encounter: Payer: Self-pay | Admitting: Internal Medicine

## 2021-03-31 DIAGNOSIS — R928 Other abnormal and inconclusive findings on diagnostic imaging of breast: Secondary | ICD-10-CM | POA: Insufficient documentation

## 2021-03-31 NOTE — Assessment & Plan Note (Signed)
Continue crestor.  Follow lipid panel and liver function tests.  

## 2021-03-31 NOTE — Assessment & Plan Note (Signed)
On metformin.  Concerned regarding remaining on metformin ( or at least on metformin at this dose).  GI symptoms as outlined.  Did not tolerate trulicity.  She was questioning ozempic.  Discussed side effects and tolerance.  She is exercising and watches her diet.  Discussed treatment options.  Trial of rybelsus.  Discussed proper time to take medication and possible side effects.  Will decrease metformin to 524m bid.  Follow.  Call with update.  Follow met b and a1c.

## 2021-03-31 NOTE — Addendum Note (Signed)
Addended by: Charm Barges on: 03/31/2021 04:04 PM   Modules accepted: Orders

## 2021-03-31 NOTE — Assessment & Plan Note (Signed)
S/p breast biopsy 12/2020.  Benign.  Surgery recommended f/u annual screening mammogram.

## 2021-03-31 NOTE — Assessment & Plan Note (Signed)
Continue losartan and metoprolol.  Blood pressure doing well.  Follow pressures.  Follow metabolic panel.  

## 2021-03-31 NOTE — Assessment & Plan Note (Signed)
On thyroid replacement.  Follow tsh.  

## 2021-03-31 NOTE — Assessment & Plan Note (Signed)
Increased stress.  Discussed.  She desires no further intervention at this time.  Follow.

## 2021-03-31 NOTE — Assessment & Plan Note (Signed)
cologuard has been mailed to her.

## 2021-04-05 ENCOUNTER — Telehealth: Payer: Self-pay | Admitting: Internal Medicine

## 2021-04-05 NOTE — Telephone Encounter (Signed)
Sarah from [employer] is called in stating that they tested the patients insurance to see if her prescription will be covered and the patients insurance will not cover rybelsus or ozempic medicine unless a prior authorization can be sent in for the patient.Please contact Sarah from [employer] at [phone].

## 2021-04-08 ENCOUNTER — Ambulatory Visit: Payer: Medicare Other | Admitting: Pharmacist

## 2021-04-08 ENCOUNTER — Encounter: Payer: Self-pay | Admitting: Internal Medicine

## 2021-04-08 DIAGNOSIS — I10 Essential (primary) hypertension: Secondary | ICD-10-CM

## 2021-04-08 DIAGNOSIS — E039 Hypothyroidism, unspecified: Secondary | ICD-10-CM

## 2021-04-08 DIAGNOSIS — E119 Type 2 diabetes mellitus without complications: Secondary | ICD-10-CM

## 2021-04-08 MED ORDER — RYBELSUS 7 MG PO TABS: 7.0000 mg | ORAL_TABLET | Freq: Every day | ORAL | 2 refills | Status: DC

## 2021-04-08 NOTE — Telephone Encounter (Signed)
Can you help with this?

## 2021-04-08 NOTE — Patient Instructions (Signed)
Visit Information  PATIENT GOALS: Goals Addressed              This Visit's Progress     Patient Stated   .  Medication Monitoring (pt-stated)         Patient Goals/Self-Care Activities . Over the next 90 days, patient will:  - take medications as prescribed check glucose twice daily, document, and provide at future appointments        Patient verbalizes understanding of instructions provided today and agrees to view in MyChart.   Plan: Telephone follow up appointment with care management team member scheduled for:  ~ 4 weeks as previously scheduled  Catie Kirti Carl, PharmD, BCACP, CPP Clinical Pharmacist [facility] [place] [phone]  

## 2021-04-08 NOTE — Chronic Care Management (AMB) (Signed)
Chronic Care Management Pharmacy Note  04/08/2021 Name:  Donna Howell MRN:  [id number] DOB:  1950-09-16   Subjective: Donna Howell is an 70 y.o. year old female who is a primary patient of Einar Pheasant, MD.  The CCM team was consulted for assistance with disease management and care coordination needs.    Care coordination  for  medication access  in response to provider referral for pharmacy case management and/or care coordination services.   Consent to Services:  The patient was given information about Chronic Care Management services, agreed to services, and gave verbal consent prior to initiation of services.  Please see initial visit note for detailed documentation.   Patient Care Team: Einar Pheasant, MD as PCP - General (Internal Medicine) De Hollingshead, RPH-CPP (Pharmacist)    Objective:  Lab Results  Component Value Date   CREATININE 0.83 02/20/2021   CREATININE 0.87 11/08/2020   CREATININE 0.88 06/27/2020    Lab Results  Component Value Date   HGBA1C 6.5 02/20/2021   Last diabetic Eye exam:  Lab Results  Component Value Date/Time   HMDIABEYEEXA No Retinopathy 05/28/2016 12:00 AM    Last diabetic Foot exam:  Lab Results  Component Value Date/Time   HMDIABFOOTEX on my exam.  11/20/2020 12:00 AM        Component Value Date/Time   CHOL 134 02/20/2021 0804   TRIG 72.0 02/20/2021 0804   HDL 78.30 02/20/2021 0804   CHOLHDL 2 02/20/2021 0804   VLDL 14.4 02/20/2021 0804   LDLCALC 41 02/20/2021 0804    Hepatic Function Latest Ref Rng & Units 02/20/2021 11/08/2020 06/27/2020  Total Protein 6.0 - 8.3 g/dL 6.7 6.8 7.2  Albumin 3.5 - 5.2 g/dL 4.4 4.2 4.8  AST 0 - 37 U/L 24 26 29   ALT 0 - 35 U/L 17 21 24   Alk Phosphatase 39 - 117 U/L 55 49 52  Total Bilirubin 0.2 - 1.2 mg/dL 0.7 0.6 0.7  Bilirubin, Direct 0.0 - 0.3 mg/dL 0.2 0.1 0.1    Lab Results  Component Value Date/Time   TSH 0.63 02/20/2021 08:04 AM   TSH 1.89 03/15/2020 08:01 AM     CBC Latest Ref Rng & Units 02/20/2021 03/15/2020 11/11/2019  WBC 4.0 - 10.5 K/uL 4.8 5.3 4.5  Hemoglobin 12.0 - 15.0 g/dL 12.4 13.2 12.7  Hematocrit 36.0 - 46.0 % 36.6 38.0 36.6  Platelets 150.0 - 400.0 K/uL 248.0 249.0 242.0    No results found for: VD25OH  Clinical ASCVD: No  The 10-year ASCVD risk score (Arnett DK, et al., 2019) is: 13.2%   Values used to calculate the score:     Age: 46 years     Sex: Female     Is Non-Hispanic African American: No     Diabetic: Yes     Tobacco smoker: No     Systolic Blood Pressure: 195 mmHg     Is BP treated: Yes     HDL Cholesterol: 78.3 mg/dL     Total Cholesterol: 134 mg/dL    Social History   Tobacco Use  Smoking Status Never  Smokeless Tobacco Never   BP Readings from Last 3 Encounters:  03/26/21 112/70  11/20/20 120/70  09/20/20 134/70   Pulse Readings from Last 3 Encounters:  03/26/21 76  11/20/20 83  09/20/20 75   Wt Readings from Last 3 Encounters:  03/26/21 122 lb 3.2 oz (55.4 kg)  02/08/21 124 lb (56.2 kg)  11/20/20 124 lb 12.8 oz (56.6 kg)  Assessment: Review of patient past medical history, allergies, medications, health status, including review of consultants reports, laboratory and other test data, was performed as part of comprehensive evaluation and provision of chronic care management services.   SDOH:  (Social Determinants of Health) assessments and interventions performed:  SDOH Interventions    Flowsheet Row Most Recent Value  SDOH Interventions   Financial Strain Interventions Other (Comment)  [PA completed]       CCM Care Plan  Allergies  Allergen Reactions   Contrast Media [Iodinated Diagnostic Agents] Other (See Comments)    Infection    Buspirone Hcl Palpitations   Penicillins Rash   Percocet [Oxycodone-Acetaminophen] Rash   Percodan [Oxycodone-Aspirin] Rash    Medications Reviewed Today     Reviewed by Einar Pheasant, MD (Physician) on 03/31/21 at [place] List Status:  <None>   Medication Order Taking? Sig Documenting Provider Last Dose Status Informant  aspirin 81 MG chewable tablet [id number] No Chew 81 mg by mouth daily. [provider] Taking Active   blood glucose meter kit and supplies KIT [id number]  Dispense based on patient and insurance preference. Use up to four times daily as directed. Einar Pheasant, MD  Active   eszopiclone Johnnye Sima) 1 MG TABS tablet [id number]  Take 1 tablet (1 mg total) by mouth at bedtime as needed for sleep. Take immediately before bedtime Einar Pheasant, MD  Active   glucose blood (FREESTYLE LITE) test strip [id number]  Use to check blood sugars three times daily Einar Pheasant, MD  Active   losartan (COZAAR) 25 MG tablet [id number] No TAKE 1 TABLET DAILY Einar Pheasant, MD Taking Active   metFORMIN (GLUCOPHAGE-XR) 500 MG 24 hr tablet [id number]  Take 1 tablet (500 mg total) by mouth 2 (two) times daily. Einar Pheasant, MD  Active   metoprolol succinate (TOPROL-XL) 25 MG 24 hr tablet [id number] No TAKE 1 TABLET IN THE MORNING AND ONE-HALF TABLET (12.5 MG) AT BEDTIME Einar Pheasant, MD Taking Active   naproxen (NAPROSYN) 500 MG tablet [id number] No Take 1 tablet (500 mg total) by mouth daily as needed.  Patient not taking: Reported on 02/25/2021   Einar Pheasant, MD Not Taking Active            Med Note Kerney Elbe Sep 20, 2020 12:01 PM) PRN  rosuvastatin (CRESTOR) 20 MG tablet [id number] No TAKE 1 TABLET DAILY Einar Pheasant, MD Taking Active   SYNTHROID 50 MCG tablet [id number] No TAKE 1 TABLET DAILY Einar Pheasant, MD Taking Active             Patient Active Problem List   Diagnosis Date Noted   Abnormal mammogram 03/31/2021   Right leg pain 11/25/2020   Rectal lump 09/22/2020   Leg cramps 04/01/2020   Hypercholesterolemia 07/16/2019   Stress 07/16/2019   Environmental allergies 10/30/2018   Difficulty sleeping 06/26/2018   Fluttering heart 11/07/2017   Raynaud's disease 05/09/2017   Side  pain 05/09/2017   Healthcare maintenance 12/06/2016   Back pain 11/09/2016   Colon cancer screening 11/09/2016   Type 2 diabetes mellitus with hyperglycemia ([place]) 11/06/2016   Hypertension, essential 11/06/2016   Hypothyroidism 11/06/2016    Immunization History  Administered Date(s) Administered   Influenza Whole 05/04/2017   Influenza,inj,Quad PF,6+ Mos 05/04/2017   Influenza-Unspecified 05/03/2018   Moderna Sars-Covid-2 Vaccination 08/30/2019, 09/27/2019, 04/28/2020, 10/26/2020   Pneumococcal Conjugate-13 08/01/2017   Pneumococcal Polysaccharide-23 10/26/2018   Td 07/11/2018   Zoster, Live 07/11/2013    Conditions  to be addressed/monitored: HTN, HLD, and DMII  Care Plan : Medication Management  Updates made by De Hollingshead, RPH-CPP since 04/08/2021 12:00 AM     Problem: Diabetes, HTN, HLD      Long-Range Goal: Disease Progression Prevention   Start Date: 02/25/2021  This Visit's Progress: On track  Recent Progress: On track  Priority: High  Note:   Current Barriers:  Complex patient with multiple comorbidites  Pharmacist Clinical Goal(s):  Over the next 90 days, patient will maintain control of diabetes as evidenced by A1c  through collaboration with PharmD and provider.   Interventions: 1:1 collaboration with Einar Pheasant, MD regarding development and update of comprehensive plan of care as evidenced by provider attestation and co-signature Inter-disciplinary care team collaboration (see longitudinal plan of care) Comprehensive medication review performed; medication list updated in electronic medical record  SDOH: Mother and special needs son lives with her. Breakfast is the only time she is away and spending time with her husband. Daily routines are hugely impacted by working around her mother's schedule.   Diabetes: Controlled; current treatment: metformin XR 500 mg BID; Rybelsus 3 mg daily Reports a history of glipizide, but severe  hypoglycemia Reported last week that a PA was required for Rybelsus. She also wonders about Ozempic. Notes that she would prefer a weekly injection vs a daily pill that must be taken 30 minutes before meals or other medications.  Completed PA for Rybelsus. PA approved through 07/27/2098 Then completed PA for Ozempic. This PA required more time for processing. PA asked about history of Bydureon, so wonder if they will deny Ozempic PA due to no history of Bydureon. Can attempt an appeal documenting patient's ASCVD risk, though no personal history of ASCVD so unsure if that will be approved. Can use Rybelsus if so. Discussed with patient, she verbalized understanding. Advised to complete 30 day supply of Rybelsus 3 mg then increase to Rybelsus 7 mg. Script sent to pharmacy.   Hypertension: Controlled; current treatment: losartan 25 mg daily, metoprolol 25 mg QAM, 12.5 mg QPM;  Current home readings: will discuss moving forward.  Previously recommended to continue current regimen at this time  Hyperlipidemia: Controlled; current treatment: rosuvastatin 20 mg daily;  Previously recommended to continue current regimen at this time  Insomnia: Controlled per patient report; current regimen: eszopiclone 1 mg QPM Reports she gets ~ 3 hours of sleep, but is not tired during the day. Feels this is appropriate and adequate for her. Avoids daytime naps Previously recommended to continue current regimen at this time.   Hypothyroidism: Controlled per last lab value; current regimen: levothyroxine 50 mcg daily Previously discussed that patient takes about 30 minutes after breakfast. She has consistent breakfasts. If fluctuation in TSH moving forward, recommend advising to move to pre-breakfast time and wait 30 minutes before breakfast or other meals. Previously recommended to continue current regimen at this time   Patient Goals/Self-Care Activities Over the next 90 days, patient will:  - take medications  as prescribed check glucose twice daily, document, and provide at future appointments  Follow Up Plan: Telephone follow up appointment with care management team member scheduled for: ~ 4 weeks as previously scheduled      Medication Assistance: None required.  Patient affirms current coverage meets needs.  Patient's preferred pharmacy is:  [facility], [place] [address] AT [address] [facility] [address] [place] [address] Phone: [phone] Fax: [phone]  [facility] -  [place], [place] - [address] [place] [address] Phone: [phone] Fax: [phone]  Follow Up:  Patient agrees to Care Plan and Follow-up.  Plan: Telephone follow up appointment with care management team member scheduled for:  ~ 4 weeks as previously scheduled  Catie Darnelle Maffucci, PharmD, [place], [place] Clinical Pharmacist [employer] at [employer] [phone]

## 2021-04-08 NOTE — Telephone Encounter (Signed)
See CCM documentation 

## 2021-04-10 ENCOUNTER — Ambulatory Visit (INDEPENDENT_AMBULATORY_CARE_PROVIDER_SITE_OTHER): Payer: Medicare Other | Admitting: Pharmacist

## 2021-04-10 DIAGNOSIS — I10 Essential (primary) hypertension: Secondary | ICD-10-CM

## 2021-04-10 DIAGNOSIS — E1165 Type 2 diabetes mellitus with hyperglycemia: Secondary | ICD-10-CM

## 2021-04-10 DIAGNOSIS — E039 Hypothyroidism, unspecified: Secondary | ICD-10-CM

## 2021-04-10 NOTE — Patient Instructions (Signed)
Visit Information  PATIENT GOALS: Goals Addressed              This Visit's Progress     Patient Stated   .  Medication Monitoring (pt-stated)         Patient Goals/Self-Care Activities . Over the next 90 days, patient will:  - take medications as prescribed check glucose twice daily, document, and provide at future appointments        Patient verbalizes understanding of instructions provided today and agrees to view in MyChart.   Plan: Telephone follow up appointment with care management team member scheduled for:  ~ 4 weeks as previously scheduled  Catie Aleksandra Raben, PharmD, BCACP, CPP Clinical Pharmacist [facility] at [place] [phone]  

## 2021-04-10 NOTE — Chronic Care Management (AMB) (Signed)
Chronic Care Management Pharmacy Note  04/10/2021 Name:  Donna Howell MRN:  [id number] DOB:  10/30/1950   Subjective: Donna Howell is an 70 y.o. year old female who is a primary patient of Einar Pheasant, MD.  The CCM team was consulted for assistance with disease management and care coordination needs.    Engaged with patient by telephone for  care coordination  in response to provider referral for pharmacy case management and/or care coordination services.   Consent to Services:  The patient was given information about Chronic Care Management services, agreed to services, and gave verbal consent prior to initiation of services.  Please see initial visit note for detailed documentation.   Patient Care Team: Einar Pheasant, MD as PCP - General (Internal Medicine) De Hollingshead, RPH-CPP (Pharmacist)   Objective:  Lab Results  Component Value Date   CREATININE 0.83 02/20/2021   CREATININE 0.87 11/08/2020   CREATININE 0.88 06/27/2020    Lab Results  Component Value Date   HGBA1C 6.5 02/20/2021   Last diabetic Eye exam:  Lab Results  Component Value Date/Time   HMDIABEYEEXA No Retinopathy 05/28/2016 12:00 AM    Last diabetic Foot exam:  Lab Results  Component Value Date/Time   HMDIABFOOTEX on my exam.  11/20/2020 12:00 AM        Component Value Date/Time   CHOL 134 02/20/2021 0804   TRIG 72.0 02/20/2021 0804   HDL 78.30 02/20/2021 0804   CHOLHDL 2 02/20/2021 0804   VLDL 14.4 02/20/2021 0804   LDLCALC 41 02/20/2021 0804    Hepatic Function Latest Ref Rng & Units 02/20/2021 11/08/2020 06/27/2020  Total Protein 6.0 - 8.3 g/dL 6.7 6.8 7.2  Albumin 3.5 - 5.2 g/dL 4.4 4.2 4.8  AST 0 - 37 U/L 24 26 29   ALT 0 - 35 U/L 17 21 24   Alk Phosphatase 39 - 117 U/L 55 49 52  Total Bilirubin 0.2 - 1.2 mg/dL 0.7 0.6 0.7  Bilirubin, Direct 0.0 - 0.3 mg/dL 0.2 0.1 0.1    Lab Results  Component Value Date/Time   TSH 0.63 02/20/2021 08:04 AM   TSH 1.89  03/15/2020 08:01 AM    CBC Latest Ref Rng & Units 02/20/2021 03/15/2020 11/11/2019  WBC 4.0 - 10.5 K/uL 4.8 5.3 4.5  Hemoglobin 12.0 - 15.0 g/dL 12.4 13.2 12.7  Hematocrit 36.0 - 46.0 % 36.6 38.0 36.6  Platelets 150.0 - 400.0 K/uL 248.0 249.0 242.0    No results found for: VD25OH  Clinical ASCVD: No  The 10-year ASCVD risk score (Arnett DK, et al., 2019) is: 13.2%   Values used to calculate the score:     Age: 21 years     Sex: Female     Is Non-Hispanic African American: No     Diabetic: Yes     Tobacco smoker: No     Systolic Blood Pressure: 741 mmHg     Is BP treated: Yes     HDL Cholesterol: 78.3 mg/dL     Total Cholesterol: 134 mg/dL    Social History   Tobacco Use  Smoking Status Never  Smokeless Tobacco Never   BP Readings from Last 3 Encounters:  03/26/21 112/70  11/20/20 120/70  09/20/20 134/70   Pulse Readings from Last 3 Encounters:  03/26/21 76  11/20/20 83  09/20/20 75   Wt Readings from Last 3 Encounters:  03/26/21 122 lb 3.2 oz (55.4 kg)  02/08/21 124 lb (56.2 kg)  11/20/20 124 lb 12.8 oz (56.6  kg)    Assessment: Review of patient past medical history, allergies, medications, health status, including review of consultants reports, laboratory and other test data, was performed as part of comprehensive evaluation and provision of chronic care management services.   SDOH:  (Social Determinants of Health) assessments and interventions performed:    CCM Care Plan  Allergies  Allergen Reactions   Contrast Media [Iodinated Diagnostic Agents] Other (See Comments)    Infection    Buspirone Hcl Palpitations   Penicillins Rash   Percocet [Oxycodone-Acetaminophen] Rash   Percodan [Oxycodone-Aspirin] Rash    Medications Reviewed Today     Reviewed by Einar Pheasant, MD (Physician) on 03/31/21 at [place] List Status: <None>   Medication Order Taking? Sig Documenting Provider Last Dose Status Informant  aspirin 81 MG chewable tablet [id number] No  Chew 81 mg by mouth daily. [provider] Taking Active   blood glucose meter kit and supplies KIT [id number]  Dispense based on patient and insurance preference. Use up to four times daily as directed. Einar Pheasant, MD  Active   eszopiclone Johnnye Sima) 1 MG TABS tablet [id number]  Take 1 tablet (1 mg total) by mouth at bedtime as needed for sleep. Take immediately before bedtime Einar Pheasant, MD  Active   glucose blood (FREESTYLE LITE) test strip [id number]  Use to check blood sugars three times daily Einar Pheasant, MD  Active   losartan (COZAAR) 25 MG tablet [id number] No TAKE 1 TABLET DAILY Einar Pheasant, MD Taking Active   metFORMIN (GLUCOPHAGE-XR) 500 MG 24 hr tablet [id number]  Take 1 tablet (500 mg total) by mouth 2 (two) times daily. Einar Pheasant, MD  Active   metoprolol succinate (TOPROL-XL) 25 MG 24 hr tablet [id number] No TAKE 1 TABLET IN THE MORNING AND ONE-HALF TABLET (12.5 MG) AT BEDTIME Einar Pheasant, MD Taking Active   naproxen (NAPROSYN) 500 MG tablet [id number] No Take 1 tablet (500 mg total) by mouth daily as needed.  Patient not taking: Reported on 02/25/2021   Einar Pheasant, MD Not Taking Active            Med Note Kerney Elbe Sep 20, 2020 12:01 PM) PRN  rosuvastatin (CRESTOR) 20 MG tablet [id number] No TAKE 1 TABLET DAILY Einar Pheasant, MD Taking Active   SYNTHROID 50 MCG tablet [id number] No TAKE 1 TABLET DAILY Einar Pheasant, MD Taking Active             Patient Active Problem List   Diagnosis Date Noted   Abnormal mammogram 03/31/2021   Right leg pain 11/25/2020   Rectal lump 09/22/2020   Leg cramps 04/01/2020   Hypercholesterolemia 07/16/2019   Stress 07/16/2019   Environmental allergies 10/30/2018   Difficulty sleeping 06/26/2018   Fluttering heart 11/07/2017   Raynaud's disease 05/09/2017   Side pain 05/09/2017   Healthcare maintenance 12/06/2016   Back pain 11/09/2016   Colon cancer screening 11/09/2016   Type 2 diabetes  mellitus with hyperglycemia ([place]) 11/06/2016   Hypertension, essential 11/06/2016   Hypothyroidism 11/06/2016    Immunization History  Administered Date(s) Administered   Influenza Whole 05/04/2017   Influenza,inj,Quad PF,6+ Mos 05/04/2017   Influenza-Unspecified 05/03/2018   Moderna Sars-Covid-2 Vaccination 08/30/2019, 09/27/2019, 04/28/2020, 10/26/2020   Pneumococcal Conjugate-13 08/01/2017   Pneumococcal Polysaccharide-23 10/26/2018   Td 07/11/2018   Zoster, Live 07/11/2013    Conditions to be addressed/monitored: HTN, HLD, and DMII  Care Plan : Medication Management  Updates made by De Hollingshead, RPH-CPP since 04/10/2021  12:00 AM     Problem: Diabetes, HTN, HLD      Long-Range Goal: Disease Progression Prevention   Start Date: 02/25/2021  This Visit's Progress: On track  Recent Progress: On track  Priority: High  Note:   Current Barriers:  Complex patient with multiple comorbidites  Pharmacist Clinical Goal(s):  Over the next 90 days, patient will maintain control of diabetes as evidenced by A1c  through collaboration with PharmD and provider.   Interventions: 1:1 collaboration with Einar Pheasant, MD regarding development and update of comprehensive plan of care as evidenced by provider attestation and co-signature Inter-disciplinary care team collaboration (see longitudinal plan of care) Comprehensive medication review performed; medication list updated in electronic medical record  SDOH: Mother and special needs son lives with her. Breakfast is the only time she is away and spending time with her husband. Daily routines are hugely impacted by working around her mother's schedule.   Diabetes: Controlled; current treatment: metformin XR 500 mg BID; Rybelsus 3 mg daily Reports a history of glipizide, but severe hypoglycemia Received notice that PA for Ozempic was denied as patient has not tried and failed Bydureon. Will complete appeal due to greater A1c  benefit demonstrated with Ozempic vs Bydureon in the SUSTAIN 3 trial and cardiovascular risk reduction demonstrated in SUSTAIN 6 trial. Patient will come by the office to sign the appeal form required for Dr. Nicki Reaper and I to submit an appeal on her behalf.   Hypertension: Controlled; current treatment: losartan 25 mg daily, metoprolol 25 mg QAM, 12.5 mg QPM;  Current home readings: will discuss moving forward.  Previously recommended to continue current regimen at this time  Hyperlipidemia: Controlled; current treatment: rosuvastatin 20 mg daily;  Previously recommended to continue current regimen at this time  Insomnia: Controlled per patient report; current regimen: eszopiclone 1 mg QPM Reports she gets ~ 3 hours of sleep, but is not tired during the day. Feels this is appropriate and adequate for her. Avoids daytime naps Previously recommended to continue current regimen at this time.   Hypothyroidism: Controlled per last lab value; current regimen: levothyroxine 50 mcg daily Previously discussed that patient takes about 30 minutes after breakfast. She has consistent breakfasts. If fluctuation in TSH moving forward, recommend advising to move to pre-breakfast time and wait 30 minutes before breakfast or other meals. Previously recommended to continue current regimen at this time   Patient Goals/Self-Care Activities Over the next 90 days, patient will:  - take medications as prescribed check glucose twice daily, document, and provide at future appointments  Follow Up Plan: Telephone follow up appointment with care management team member scheduled for: ~ 4 weeks as previously scheduled      Medication Assistance: None required.  Patient affirms current coverage meets needs.  Patient's preferred pharmacy is:  [facility] [place], [place] AT [facility] [address] [place] [address] Phone: [phone] Fax:  [phone]  [facility] [place], [place] [address] [place] [address] Phone: [phone] Fax: [phone]   Follow Up:  Patient agrees to Care Plan and Follow-up.  Plan: Telephone follow up appointment with care management team member scheduled for:  ~ 4 weeks as previously scheduled  Catie Darnelle Maffucci, PharmD, [place], [place] Clinical Pharmacist [employer] at [employer] [phone]

## 2021-04-18 ENCOUNTER — Ambulatory Visit: Payer: Medicare Other | Admitting: Pharmacist

## 2021-04-18 DIAGNOSIS — E119 Type 2 diabetes mellitus without complications: Secondary | ICD-10-CM

## 2021-04-18 DIAGNOSIS — E7800 Pure hypercholesterolemia, unspecified: Secondary | ICD-10-CM

## 2021-04-18 MED ORDER — FREESTYLE LITE TEST VI STRP: ORAL_STRIP | 3 refills | Status: DC

## 2021-04-18 NOTE — Patient Instructions (Signed)
Visit Information  PATIENT GOALS:  Goals Addressed               This Visit's Progress     Patient Stated     Medication Monitoring (pt-stated)        Patient Goals/Self-Care Activities Over the next 90 days, patient will:  - take medications as prescribed check glucose twice daily, document, and provide at future appointments          Patient verbalizes understanding of instructions provided today and agrees to view in MyChart.   Plan: Telephone follow up appointment with care management team member scheduled for:  ~3 days  Catie Feliz Beam, PharmD, [place], CPP Clinical Pharmacist [employer] at [facility] [phone]

## 2021-04-18 NOTE — Chronic Care Management (AMB) (Signed)
Chronic Care Management Pharmacy Note  04/18/2021 Name:  Donna Howell MRN:  [id number] DOB:  May 25, 1951   Subjective: Donna Howell is an 70 y.o. year old female who is a primary patient of Einar Pheasant, MD.  The CCM team was consulted for assistance with disease management and care coordination needs.    Engaged with patient by telephone for follow up visit in response to provider referral for pharmacy case management and/or care coordination services.   Consent to Services:  The patient was given information about Chronic Care Management services, agreed to services, and gave verbal consent prior to initiation of services.  Please see initial visit note for detailed documentation.   Patient Care Team: Einar Pheasant, MD as PCP - General (Internal Medicine) De Hollingshead, RPH-CPP (Pharmacist)    Objective:  Lab Results  Component Value Date   CREATININE 0.83 02/20/2021   CREATININE 0.87 11/08/2020   CREATININE 0.88 06/27/2020    Lab Results  Component Value Date   HGBA1C 6.5 02/20/2021   Last diabetic Eye exam:  Lab Results  Component Value Date/Time   HMDIABEYEEXA No Retinopathy 05/28/2016 12:00 AM    Last diabetic Foot exam:  Lab Results  Component Value Date/Time   HMDIABFOOTEX on my exam.  11/20/2020 12:00 AM        Component Value Date/Time   CHOL 134 02/20/2021 0804   TRIG 72.0 02/20/2021 0804   HDL 78.30 02/20/2021 0804   CHOLHDL 2 02/20/2021 0804   VLDL 14.4 02/20/2021 0804   LDLCALC 41 02/20/2021 0804    Hepatic Function Latest Ref Rng & Units 02/20/2021 11/08/2020 06/27/2020  Total Protein 6.0 - 8.3 g/dL 6.7 6.8 7.2  Albumin 3.5 - 5.2 g/dL 4.4 4.2 4.8  AST 0 - 37 U/L 24 26 29   ALT 0 - 35 U/L 17 21 24   Alk Phosphatase 39 - 117 U/L 55 49 52  Total Bilirubin 0.2 - 1.2 mg/dL 0.7 0.6 0.7  Bilirubin, Direct 0.0 - 0.3 mg/dL 0.2 0.1 0.1    Lab Results  Component Value Date/Time   TSH 0.63 02/20/2021 08:04 AM   TSH 1.89 03/15/2020  08:01 AM    CBC Latest Ref Rng & Units 02/20/2021 03/15/2020 11/11/2019  WBC 4.0 - 10.5 K/uL 4.8 5.3 4.5  Hemoglobin 12.0 - 15.0 g/dL 12.4 13.2 12.7  Hematocrit 36.0 - 46.0 % 36.6 38.0 36.6  Platelets 150.0 - 400.0 K/uL 248.0 249.0 242.0     Social History   Tobacco Use  Smoking Status Never  Smokeless Tobacco Never   BP Readings from Last 3 Encounters:  03/26/21 112/70  11/20/20 120/70  09/20/20 134/70   Pulse Readings from Last 3 Encounters:  03/26/21 76  11/20/20 83  09/20/20 75   Wt Readings from Last 3 Encounters:  03/26/21 122 lb 3.2 oz (55.4 kg)  02/08/21 124 lb (56.2 kg)  11/20/20 124 lb 12.8 oz (56.6 kg)    Assessment: Review of patient past medical history, allergies, medications, health status, including review of consultants reports, laboratory and other test data, was performed as part of comprehensive evaluation and provision of chronic care management services.   SDOH:  (Social Determinants of Health) assessments and interventions performed:  SDOH Interventions    Flowsheet Row Most Recent Value  SDOH Interventions   Financial Strain Interventions Intervention Not Indicated       CCM Care Plan  Allergies  Allergen Reactions   Contrast Media [Iodinated Diagnostic Agents] Other (See Comments)  Infection    Buspirone Hcl Palpitations   Penicillins Rash   Percocet [Oxycodone-Acetaminophen] Rash   Percodan [Oxycodone-Aspirin] Rash    Medications Reviewed Today     Reviewed by De Hollingshead, RPH-CPP (Pharmacist) on 04/18/21 at 1105  Med List Status: <None>   Medication Order Taking? Sig Documenting Provider Last Dose Status Informant  aspirin 81 MG chewable tablet [id number] Yes Chew 81 mg by mouth daily. [provider] Taking Active   blood glucose meter kit and supplies KIT [id number] Yes Dispense based on patient and insurance preference. Use up to four times daily as directed. Einar Pheasant, MD Taking Active   eszopiclone  Johnnye Sima) 1 MG TABS tablet [id number] Yes Take 1 tablet (1 mg total) by mouth at bedtime as needed for sleep. Take immediately before bedtime Einar Pheasant, MD Taking Active   glucose blood (FREESTYLE LITE) test strip [id number]  Use to check blood sugars three times daily Einar Pheasant, MD  Active   losartan (COZAAR) 25 MG tablet [id number] Yes TAKE 1 TABLET DAILY Einar Pheasant, MD Taking Active   metFORMIN (GLUCOPHAGE-XR) 500 MG 24 hr tablet [id number] Yes Take 1 tablet (500 mg total) by mouth 2 (two) times daily. Einar Pheasant, MD Taking Active   metoprolol succinate (TOPROL-XL) 25 MG 24 hr tablet [id number]  TAKE 1 TABLET IN THE MORNING AND ONE-HALF TABLET (12.5 MG) AT BEDTIME Einar Pheasant, MD  Active   rosuvastatin (CRESTOR) 20 MG tablet [id number] Yes TAKE 1 TABLET DAILY Einar Pheasant, MD Taking Active   Semaglutide (RYBELSUS) 7 MG TABS [id number] Yes Take 7 mg by mouth daily. Einar Pheasant, MD Taking Active   SYNTHROID 62 MCG tablet [id number] Yes TAKE 1 TABLET DAILY Einar Pheasant, MD Taking Active             Patient Active Problem List   Diagnosis Date Noted   Abnormal mammogram 03/31/2021   Right leg pain 11/25/2020   Rectal lump 09/22/2020   Leg cramps 04/01/2020   Hypercholesterolemia 07/16/2019   Stress 07/16/2019   Environmental allergies 10/30/2018   Difficulty sleeping 06/26/2018   Fluttering heart 11/07/2017   Raynaud's disease 05/09/2017   Side pain 05/09/2017   Healthcare maintenance 12/06/2016   Back pain 11/09/2016   Colon cancer screening 11/09/2016   Type 2 diabetes mellitus with hyperglycemia ([place]) 11/06/2016   Hypertension, essential 11/06/2016   Hypothyroidism 11/06/2016    Immunization History  Administered Date(s) Administered   Influenza Whole 05/04/2017   Influenza,inj,Quad PF,6+ Mos 05/04/2017   Influenza-Unspecified 05/03/2018   Moderna Sars-Covid-2 Vaccination 08/30/2019, 09/27/2019, 04/28/2020, 10/26/2020   Pneumococcal Conjugate-13  08/01/2017   Pneumococcal Polysaccharide-23 10/26/2018   Td 07/11/2018   Zoster, Live 07/11/2013    Conditions to be addressed/monitored: HTN, HLD, and DMII  Care Plan : Medication Management  Updates made by De Hollingshead, RPH-CPP since 04/18/2021 12:00 AM     Problem: Diabetes, HTN, HLD      Long-Range Goal: Disease Progression Prevention   Start Date: 02/25/2021  This Visit's Progress: On track  Recent Progress: On track  Priority: High  Note:   Current Barriers:  Complex patient with multiple comorbidites  Pharmacist Clinical Goal(s):  Over the next 90 days, patient will maintain control of diabetes as evidenced by A1c  through collaboration with PharmD and provider.   Interventions: 1:1 collaboration with Einar Pheasant, MD regarding development and update of comprehensive plan of care as evidenced by provider attestation and co-signature Inter-disciplinary care team collaboration (see longitudinal plan of care) Comprehensive  medication review performed; medication list updated in electronic medical record  SDOH: Mother and special needs son lives with her. Breakfast is the only time she is away and spending time with her husband. Daily routines are hugely impacted by working around her mother's schedule.   Diabetes: Controlled; current treatment: metformin XR 500 mg BID; Rybelsus 7 mg daily - has not picked up yet  Does note some occasional queasiness, especially at night. Denies heavy, large, fatty meals.  Does report improvement in symptoms of her bra fitting too tightly at the end of the day with the reduction in metformin dose.  Reports a history of glipizide, but severe hypoglycemia Submitted appeal to [employer] on 04/11/21. Called today to follow up. They are now saying that they never received the fax, though I confirmed that I sent it to the correct fax. Resent. Will call back next week to ensure appeal was received. They note it can take 30-45  days for an appeal to be processed. Notified patient of this.  Discussed that we could continue Rybelsus 3 mg daily (given symptoms) or increase to 7 mg daily, as patient is still dissatisfied.  Patient will pick up Rybelsus 7 mg daily script. Counseled on potential increase in GI symptoms with dose increase. Encouraged to give higher dose at least a week, but if intolerable GI side effects, contact me and we can return to 3 mg dose.  Reports that [facility] needs a new prescription for test strips. Contacted [facility], confirmed that a new Part B compliant script with diagnosis code is required. Sent this in.   Hypertension: Controlled; current treatment: losartan 25 mg daily, metoprolol succinate 25 mg QAM, 12.5 mg QPM;  Current home readings: will discuss moving forward.  Previously recommended to continue current regimen at this time  Hyperlipidemia: Controlled; current treatment: rosuvastatin 20 mg daily;  Previously recommended to continue current regimen at this time  Insomnia: Controlled per patient report; current regimen: eszopiclone 1 mg QPM Reports she gets ~ 3 hours of sleep, but is not tired during the day. Feels this is appropriate and adequate for her. Avoids daytime naps Previously recommended to continue current regimen at this time.   Hypothyroidism: Controlled per last lab value; current regimen: levothyroxine 50 mcg daily Previously discussed that patient takes about 30 minutes after breakfast. She has consistent breakfasts. If fluctuation in TSH moving forward, recommend advising to move to pre-breakfast time and wait 30 minutes before breakfast or other meals. Previously recommended to continue current regimen at this time   Patient Goals/Self-Care Activities Over the next 90 days, patient will:  - take medications as prescribed check glucose twice daily, document, and provide at future appointments  Follow Up Plan: Telephone follow up appointment with care management  team member scheduled for: 3 days      Medication Assistance: None required.  Patient affirms current coverage meets needs.  Patient's preferred pharmacy is:  [facility] [place], [place] [facility] AT [facility] [address] [place] [address] Phone: [phone] Fax: [phone]  [facility] [place], [place] [address] [place] [address] Phone: [phone] Fax: [phone]  Follow Up:  Patient agrees to Care Plan and Follow-up.  Plan: Telephone follow up appointment with care management team member scheduled for:  ~3 days  Catie Darnelle Maffucci, PharmD, [place], [place] Clinical Pharmacist [employer] at [facility] [phone]

## 2021-04-20 DIAGNOSIS — Z23 Encounter for immunization: Secondary | ICD-10-CM | POA: Diagnosis not present

## 2021-04-22 ENCOUNTER — Ambulatory Visit: Payer: Medicare Other | Admitting: Pharmacist

## 2021-04-22 ENCOUNTER — Telehealth: Payer: Self-pay | Admitting: Internal Medicine

## 2021-04-22 DIAGNOSIS — E1165 Type 2 diabetes mellitus with hyperglycemia: Secondary | ICD-10-CM

## 2021-04-22 DIAGNOSIS — E7800 Pure hypercholesterolemia, unspecified: Secondary | ICD-10-CM

## 2021-04-22 DIAGNOSIS — I10 Essential (primary) hypertension: Secondary | ICD-10-CM

## 2021-04-22 NOTE — Chronic Care Management (AMB) (Signed)
Chronic Care Management Pharmacy Note  04/22/2021 Name:  Skila Rollins MRN:  [id number] DOB:  1950/08/20   Subjective: Shavonte Zhao is an 70 y.o. year old female who is a primary patient of Einar Pheasant, MD.  The CCM team was consulted for assistance with disease management and care coordination needs.    Care coordination for medication and device access in response to provider referral for pharmacy case management and/or care coordination services.   Consent to Services:  The patient was given information about Chronic Care Management services, agreed to services, and gave verbal consent prior to initiation of services.  Please see initial visit note for detailed documentation.   Patient Care Team: Einar Pheasant, MD as PCP - General (Internal Medicine) De Hollingshead, RPH-CPP (Pharmacist)  Objective:  Lab Results  Component Value Date   CREATININE 0.83 02/20/2021   CREATININE 0.87 11/08/2020   CREATININE 0.88 06/27/2020    Lab Results  Component Value Date   HGBA1C 6.5 02/20/2021   Last diabetic Eye exam:  Lab Results  Component Value Date/Time   HMDIABEYEEXA No Retinopathy 05/28/2016 12:00 AM    Last diabetic Foot exam:  Lab Results  Component Value Date/Time   HMDIABFOOTEX on my exam.  11/20/2020 12:00 AM        Component Value Date/Time   CHOL 134 02/20/2021 0804   TRIG 72.0 02/20/2021 0804   HDL 78.30 02/20/2021 0804   CHOLHDL 2 02/20/2021 0804   VLDL 14.4 02/20/2021 0804   LDLCALC 41 02/20/2021 0804    Hepatic Function Latest Ref Rng & Units 02/20/2021 11/08/2020 06/27/2020  Total Protein 6.0 - 8.3 g/dL 6.7 6.8 7.2  Albumin 3.5 - 5.2 g/dL 4.4 4.2 4.8  AST 0 - 37 U/L 24 26 29   ALT 0 - 35 U/L 17 21 24   Alk Phosphatase 39 - 117 U/L 55 49 52  Total Bilirubin 0.2 - 1.2 mg/dL 0.7 0.6 0.7  Bilirubin, Direct 0.0 - 0.3 mg/dL 0.2 0.1 0.1    Lab Results  Component Value Date/Time   TSH 0.63 02/20/2021 08:04 AM   TSH 1.89 03/15/2020 08:01  AM    CBC Latest Ref Rng & Units 02/20/2021 03/15/2020 11/11/2019  WBC 4.0 - 10.5 K/uL 4.8 5.3 4.5  Hemoglobin 12.0 - 15.0 g/dL 12.4 13.2 12.7  Hematocrit 36.0 - 46.0 % 36.6 38.0 36.6  Platelets 150.0 - 400.0 K/uL 248.0 249.0 242.0      Social History   Tobacco Use  Smoking Status Never  Smokeless Tobacco Never   BP Readings from Last 3 Encounters:  03/26/21 112/70  11/20/20 120/70  09/20/20 134/70   Pulse Readings from Last 3 Encounters:  03/26/21 76  11/20/20 83  09/20/20 75   Wt Readings from Last 3 Encounters:  03/26/21 122 lb 3.2 oz (55.4 kg)  02/08/21 124 lb (56.2 kg)  11/20/20 124 lb 12.8 oz (56.6 kg)    Assessment: Review of patient past medical history, allergies, medications, health status, including review of consultants reports, laboratory and other test data, was performed as part of comprehensive evaluation and provision of chronic care management services.   SDOH:  (Social Determinants of Health) assessments and interventions performed:  SDOH Interventions    Flowsheet Row Most Recent Value  SDOH Interventions   Financial Strain Interventions Other (Comment)  [appeal submitted]       CCM Care Plan  Allergies  Allergen Reactions   Contrast Media [Iodinated Diagnostic Agents] Other (See Comments)    Infection  Buspirone Hcl Palpitations   Penicillins Rash   Percocet [Oxycodone-Acetaminophen] Rash   Percodan [Oxycodone-Aspirin] Rash    Medications Reviewed Today     Reviewed by De Hollingshead, RPH-CPP (Pharmacist) on 04/18/21 at 1105  Med List Status: <None>   Medication Order Taking? Sig Documenting Provider Last Dose Status Informant  aspirin 81 MG chewable tablet [id number] Yes Chew 81 mg by mouth daily. [provider] Taking Active   blood glucose meter kit and supplies KIT [id number] Yes Dispense based on patient and insurance preference. Use up to four times daily as directed. Einar Pheasant, MD Taking Active    eszopiclone Johnnye Sima) 1 MG TABS tablet [id number] Yes Take 1 tablet (1 mg total) by mouth at bedtime as needed for sleep. Take immediately before bedtime Einar Pheasant, MD Taking Active   glucose blood (FREESTYLE LITE) test strip [id number]  Use to check blood sugars three times daily Einar Pheasant, MD  Active   losartan (COZAAR) 25 MG tablet [id number] Yes TAKE 1 TABLET DAILY Einar Pheasant, MD Taking Active   metFORMIN (GLUCOPHAGE-XR) 500 MG 24 hr tablet [id number] Yes Take 1 tablet (500 mg total) by mouth 2 (two) times daily. Einar Pheasant, MD Taking Active   metoprolol succinate (TOPROL-XL) 25 MG 24 hr tablet [id number]  TAKE 1 TABLET IN THE MORNING AND ONE-HALF TABLET (12.5 MG) AT BEDTIME Einar Pheasant, MD  Active   rosuvastatin (CRESTOR) 20 MG tablet [id number] Yes TAKE 1 TABLET DAILY Einar Pheasant, MD Taking Active   Semaglutide (RYBELSUS) 7 MG TABS [id number] Yes Take 7 mg by mouth daily. Einar Pheasant, MD Taking Active   SYNTHROID 85 MCG tablet [id number] Yes TAKE 1 TABLET DAILY Einar Pheasant, MD Taking Active             Patient Active Problem List   Diagnosis Date Noted   Abnormal mammogram 03/31/2021   Right leg pain 11/25/2020   Rectal lump 09/22/2020   Leg cramps 04/01/2020   Hypercholesterolemia 07/16/2019   Stress 07/16/2019   Environmental allergies 10/30/2018   Difficulty sleeping 06/26/2018   Fluttering heart 11/07/2017   Raynaud's disease 05/09/2017   Side pain 05/09/2017   Healthcare maintenance 12/06/2016   Back pain 11/09/2016   Colon cancer screening 11/09/2016   Type 2 diabetes mellitus with hyperglycemia ([place]) 11/06/2016   Hypertension, essential 11/06/2016   Hypothyroidism 11/06/2016    Immunization History  Administered Date(s) Administered   Influenza Whole 05/04/2017   Influenza,inj,Quad PF,6+ Mos 05/04/2017   Influenza-Unspecified 05/03/2018   Moderna Sars-Covid-2 Vaccination 08/30/2019, 09/27/2019, 04/28/2020, 10/26/2020    Pneumococcal Conjugate-13 08/01/2017   Pneumococcal Polysaccharide-23 10/26/2018   Td 07/11/2018   Zoster, Live 07/11/2013    Conditions to be addressed/monitored: HTN, HLD, and DMII  Care Plan : Medication Management  Updates made by De Hollingshead, RPH-CPP since 04/22/2021 12:00 AM     Problem: Diabetes, HTN, HLD      Long-Range Goal: Disease Progression Prevention   Start Date: 02/25/2021  This Visit's Progress: On track  Recent Progress: On track  Priority: High  Note:   Current Barriers:  Complex patient with multiple comorbidites  Pharmacist Clinical Goal(s):  Over the next 90 days, patient will maintain control of diabetes as evidenced by A1c  through collaboration with PharmD and provider.   Interventions: 1:1 collaboration with Einar Pheasant, MD regarding development and update of comprehensive plan of care as evidenced by provider attestation and co-signature Inter-disciplinary care team collaboration (see longitudinal plan of care) Comprehensive medication review performed; medication  list updated in electronic medical record  SDOH: Mother and special needs son lives with her. Breakfast is the only time she is away and spending time with her husband. Daily routines are hugely impacted by working around her mother's schedule.   Diabetes: Controlled; current treatment: metformin XR 500 mg BID; Rybelsus 7 mg daily Does note some occasional queasiness, especially at night. Denies heavy, large, fatty meals.  Does report improvement in symptoms of her bra fitting too tightly at the end of the day with the reduction in metformin dose.  Reports a history of glipizide, but severe hypoglycemia Still awaiting appeal decision from [place] regarding script for Ozempic. Contacted [employer] to ensure appeal was received and opened. They began processing 04/18/21 and can take 30-45 days for a response.  Received call from [facility] that further information was needed to justify  script for three times a day blood sugar testing. Provided to pharmacist. They will fill prescription today.  Previously recommended to continue current regimen at this time  Hypertension: Controlled; current treatment: losartan 25 mg daily, metoprolol succinate 25 mg QAM, 12.5 mg QPM;  Current home readings: will discuss moving forward.  Previously recommended to continue current regimen at this time  Hyperlipidemia: Controlled; current treatment: rosuvastatin 20 mg daily;  Previously recommended to continue current regimen at this time  Insomnia: Controlled per patient report; current regimen: eszopiclone 1 mg QPM Reports she gets ~ 3 hours of sleep, but is not tired during the day. Feels this is appropriate and adequate for her. Avoids daytime naps Previously recommended to continue current regimen at this time.   Hypothyroidism: Controlled per last lab value; current regimen: levothyroxine 50 mcg daily Previously discussed that patient takes about 30 minutes after breakfast. She has consistent breakfasts. If fluctuation in TSH moving forward, recommend advising to move to pre-breakfast time and wait 30 minutes before breakfast or other meals. Previously recommended to continue current regimen at this time   Patient Goals/Self-Care Activities Over the next 90 days, patient will:  - take medications as prescribed check glucose twice daily, document, and provide at future appointments  Follow Up Plan: Telephone follow up appointment with care management team member scheduled for: 6 wekes      Medication Assistance: None required.  Patient affirms current coverage meets needs.  Patient's preferred pharmacy is:  [facility] [place], [place] [facility] AT [facility] [address] [place] [address] Phone: [phone] Fax: [phone]  [facility] [place], [employer] [place] [address] [place] [address] Phone: [phone] Fax: [phone]   Follow Up:  Patient agrees to Care Plan and Follow-up.  Plan: Telephone follow up appointment with care management team member scheduled for:  ~6 weeks  Catie Darnelle Maffucci, PharmD, [place], [place] Clinical Pharmacist [employer] at [employer] [phone]

## 2021-04-22 NOTE — Telephone Encounter (Signed)
See CCM documentation 

## 2021-04-22 NOTE — Telephone Encounter (Signed)
Pharmacist calling in and states they are trying to run the Patient's script for her test strips through Medicare part B. States that since the Patient is not on injectable insulin but is testing three times a day they need a verbal stating that the office is keeping documentation as to why the Patient is testing this much.   Needing a call back, Please advise

## 2021-04-22 NOTE — Patient Instructions (Signed)
Visit Information  PATIENT GOALS:  Goals Addressed               This Visit's Progress     Patient Stated     Medication Monitoring (pt-stated)        Patient Goals/Self-Care Activities Over the next 90 days, patient will:  - take medications as prescribed check glucose twice daily, document, and provide at future appointments        Patient verbalizes understanding of instructions provided today and agrees to view in MyChart.   Plan: Telephone follow up appointment with care management team member scheduled for:  ~6 weeks  Catie Feliz Beam, PharmD, [place], CPP Clinical Pharmacist [employer] at [employer] [phone]

## 2021-04-26 DIAGNOSIS — I10 Essential (primary) hypertension: Secondary | ICD-10-CM

## 2021-04-26 DIAGNOSIS — E119 Type 2 diabetes mellitus without complications: Secondary | ICD-10-CM

## 2021-04-26 DIAGNOSIS — E7800 Pure hypercholesterolemia, unspecified: Secondary | ICD-10-CM

## 2021-04-26 DIAGNOSIS — E039 Hypothyroidism, unspecified: Secondary | ICD-10-CM

## 2021-04-26 DIAGNOSIS — E1165 Type 2 diabetes mellitus with hyperglycemia: Secondary | ICD-10-CM | POA: Diagnosis not present

## 2021-04-30 ENCOUNTER — Encounter: Payer: Self-pay | Admitting: Family

## 2021-04-30 ENCOUNTER — Ambulatory Visit (INDEPENDENT_AMBULATORY_CARE_PROVIDER_SITE_OTHER): Payer: Medicare Other | Admitting: Family

## 2021-04-30 ENCOUNTER — Other Ambulatory Visit: Payer: Self-pay

## 2021-04-30 VITALS — BP 122/84 | HR 70 | Temp 96.2°F | Ht 65.0 in | Wt 124.0 lb

## 2021-04-30 DIAGNOSIS — R350 Frequency of micturition: Secondary | ICD-10-CM

## 2021-04-30 DIAGNOSIS — R102 Pelvic and perineal pain: Secondary | ICD-10-CM | POA: Diagnosis not present

## 2021-04-30 DIAGNOSIS — M25511 Pain in right shoulder: Secondary | ICD-10-CM | POA: Diagnosis not present

## 2021-04-30 LAB — POCT URINALYSIS DIPSTICK
Bilirubin, UA: NEGATIVE
Glucose, UA: NEGATIVE
Ketones, UA: NEGATIVE
Nitrite, UA: NEGATIVE
Protein, UA: NEGATIVE
Spec Grav, UA: 1.01 (ref 1.010–1.025)
Urobilinogen, UA: 0.2 E.U./dL
pH, UA: 6 (ref 5.0–8.0)

## 2021-04-30 MED ORDER — NITROFURANTOIN MONOHYD MACRO 100 MG PO CAPS: 100.0000 mg | ORAL_CAPSULE | Freq: Two times a day (BID) | ORAL | 0 refills | Status: DC

## 2021-04-30 NOTE — Progress Notes (Signed)
Acute Office Visit  Subjective:    Patient ID: Donna Howell, female    DOB: 1950-11-12, 70 y.o.   MRN: [id number]  Chief Complaint  Patient presents with   urinary symptoms    Pt is having pressure in her pelvic area and the constant feeling of having to urinate    HPI Patient is in today with c/o urinary frequency, urgency and vaginal dryness. She feels a pressure sensation. Has  not had a UTI in nearly 9 years. No fever or chills.   Past Medical History:  Diagnosis Date   Arthritis    Chicken pox    Diabetes (HCC)    Frequent headaches    GERD (gastroesophageal reflux disease)    Hay fever    Heart murmur    High blood pressure    History of bronchitis    Hypothyroidism     Past Surgical History:  Procedure Laterality Date   APPENDECTOMY  1974   BREAST BIOPSY Left 01/16/2021   Affirm bx-"Ribbon" clip-path pending   TONSILLECTOMY AND ADENOIDECTOMY  1962    Family History  Problem Relation Age of Onset   Arthritis Mother    Heart disease Mother    Diabetes Mother    Stroke Father    Arthritis Sister    Breast cancer Sister 63   Alcoholism Brother    Arthritis Maternal Grandmother    Heart disease Maternal Grandmother    Diabetes Maternal Grandmother     Social History   Socioeconomic History   Marital status: Married    Spouse name: Not on file   Number of children: Not on file   Years of education: Not on file   Highest education level: Not on file  Occupational History   Not on file  Tobacco Use   Smoking status: Never   Smokeless tobacco: Never  Substance and Sexual Activity   Alcohol use: No   Drug use: No   Sexual activity: Not on file  Other Topics Concern   Not on file  Social History Narrative   Not on file   Social Determinants of Health   Financial Resource Strain: Low Risk    Difficulty of Paying Living Expenses: Not very hard  Food Insecurity: No Food Insecurity   Worried About Running Out of Food in the Last Year: Never  true   [place] in the Last Year: Never true  Transportation Needs: No Transportation Needs   Lack of Transportation (Medical): No   Lack of Transportation (Non-Medical): No  Physical Activity: Sufficiently Active   Days of Exercise per Week: 7 days   Minutes of Exercise per Session: 60 min  Stress: No Stress Concern Present   Feeling of Stress : Not at all  Social Connections: Unknown   Frequency of Communication with Friends and Family: Not on file   Frequency of Social Gatherings with Friends and Family: Not on file   Attends Religious Services: Not on Electrical engineer or Organizations: Not on file   Attends Archivist Meetings: Not on file   Marital Status: Married  Human resources officer Violence: Not At Risk   Fear of Current or Ex-Partner: No   Emotionally Abused: No   Physically Abused: No   Sexually Abused: No    Outpatient Medications Prior to Visit  Medication Sig Dispense Refill   aspirin 81 MG chewable tablet Chew 81 mg by mouth daily.     blood glucose meter kit  and supplies KIT Dispense based on patient and insurance preference. Use up to four times daily as directed. 1 each 3   eszopiclone (LUNESTA) 1 MG TABS tablet Take 1 tablet (1 mg total) by mouth at bedtime as needed for sleep. Take immediately before bedtime 30 tablet 1   glucose blood (FREESTYLE LITE) test strip Use to check blood sugars three times daily. E11.65 100 strip 3   losartan (COZAAR) 25 MG tablet TAKE 1 TABLET DAILY 90 tablet 3   metFORMIN (GLUCOPHAGE-XR) 500 MG 24 hr tablet Take 1 tablet (500 mg total) by mouth 2 (two) times daily. 180 tablet 3   metoprolol succinate (TOPROL-XL) 25 MG 24 hr tablet TAKE 1 TABLET IN THE MORNING AND ONE-HALF TABLET (12.5 MG) AT BEDTIME 135 tablet 3   rosuvastatin (CRESTOR) 20 MG tablet TAKE 1 TABLET DAILY 90 tablet 3   Semaglutide (RYBELSUS) 7 MG TABS Take 7 mg by mouth daily. 30 tablet 2   SYNTHROID 50 MCG tablet TAKE 1 TABLET DAILY 90 tablet  3   No facility-administered medications prior to visit.    Allergies  Allergen Reactions   Contrast Media [Iodinated Diagnostic Agents] Other (See Comments)    Infection    Buspirone Hcl Palpitations   Penicillins Rash   Percocet [Oxycodone-Acetaminophen] Rash   Percodan [Oxycodone-Aspirin] Rash    Review of Systems  Respiratory: Negative.    Genitourinary:  Positive for dysuria, frequency and urgency. Negative for hematuria and vaginal pain.  Skin: Negative.   Allergic/Immunologic: Negative.   All other systems reviewed and are negative.     Objective:    Physical Exam Vitals and nursing note reviewed.  Constitutional:      Appearance: Normal appearance. She is normal weight.  Cardiovascular:     Rate and Rhythm: Normal rate and regular rhythm.     Pulses: Normal pulses.     Heart sounds: Normal heart sounds.  Pulmonary:     Effort: Pulmonary effort is normal.     Breath sounds: Normal breath sounds.  Abdominal:     General: Abdomen is flat.     Palpations: Abdomen is soft.     Tenderness: There is no abdominal tenderness. There is no guarding or rebound.  Musculoskeletal:        General: Normal range of motion.     Cervical back: Normal range of motion and neck supple.  Skin:    General: Skin is warm and dry.  Neurological:     General: No focal deficit present.     Mental Status: She is alert and oriented to person, place, and time.  Psychiatric:        Mood and Affect: Mood normal.        Behavior: Behavior normal.    BP 122/84   Pulse 70   Temp (!) 96.2 F (35.7 C)   Ht 5' 5"  (1.651 m)   Wt 124 lb (56.2 kg)   SpO2 98%   BMI 20.63 kg/m  Wt Readings from Last 3 Encounters:  04/30/21 124 lb (56.2 kg)  03/26/21 122 lb 3.2 oz (55.4 kg)  02/08/21 124 lb (56.2 kg)    Health Maintenance Due  Topic Date Due   OPHTHALMOLOGY EXAM  03/26/2021    There are no preventive care reminders to display for this patient.   Lab Results  Component Value  Date   TSH 0.63 02/20/2021   Lab Results  Component Value Date   WBC 4.8 02/20/2021   HGB 12.4 02/20/2021  HCT 36.6 02/20/2021   MCV 87.9 02/20/2021   PLT 248.0 02/20/2021   Lab Results  Component Value Date   NA 134 (L) 02/20/2021   K 4.3 02/20/2021   CO2 30 02/20/2021   GLUCOSE 100 (H) 02/20/2021   BUN 10 02/20/2021   CREATININE 0.83 02/20/2021   BILITOT 0.7 02/20/2021   ALKPHOS 55 02/20/2021   AST 24 02/20/2021   ALT 17 02/20/2021   PROT 6.7 02/20/2021   ALBUMIN 4.4 02/20/2021   CALCIUM 9.3 02/20/2021   GFR 71.83 02/20/2021   Lab Results  Component Value Date   CHOL 134 02/20/2021   Lab Results  Component Value Date   HDL 78.30 02/20/2021   Lab Results  Component Value Date   LDLCALC 41 02/20/2021   Lab Results  Component Value Date   TRIG 72.0 02/20/2021   Lab Results  Component Value Date   CHOLHDL 2 02/20/2021   Lab Results  Component Value Date   HGBA1C 6.5 02/20/2021       Assessment & Plan:   Problem List Items Addressed This Visit   None Visit Diagnoses     Pelvic pressure in female    -  Primary   Relevant Orders   POC Urinalysis Dipstick   Urine Culture   Urinary frequency            Meds ordered this encounter  Medications   nitrofurantoin, macrocrystal-monohydrate, (MACROBID) 100 MG capsule    Sig: Take 1 capsule (100 mg total) by mouth 2 (two) times daily.    Dispense:  10 capsule    Refill:  0    Call the office with any questions or concerns. Follow up as scheduled and as needed.   Kennyth Arnold, FNP

## 2021-05-01 ENCOUNTER — Encounter: Payer: Self-pay | Admitting: Family

## 2021-05-02 ENCOUNTER — Telehealth: Payer: Self-pay | Admitting: Pharmacist

## 2021-05-02 ENCOUNTER — Ambulatory Visit: Payer: Medicare Other | Admitting: Pharmacist

## 2021-05-02 DIAGNOSIS — E119 Type 2 diabetes mellitus without complications: Secondary | ICD-10-CM

## 2021-05-02 DIAGNOSIS — E1165 Type 2 diabetes mellitus with hyperglycemia: Secondary | ICD-10-CM

## 2021-05-02 DIAGNOSIS — E7800 Pure hypercholesterolemia, unspecified: Secondary | ICD-10-CM

## 2021-05-02 LAB — URINE CULTURE
MICRO NUMBER:: 12458273
SPECIMEN QUALITY:: ADEQUATE

## 2021-05-02 MED ORDER — OZEMPIC (0.25 OR 0.5 MG/DOSE) 2 MG/1.5ML ~~LOC~~ SOPN: 0.2500 mg | PEN_INJECTOR | SUBCUTANEOUS | 2 refills | Status: DC

## 2021-05-02 MED ORDER — PEN NEEDLES 33G X 4 MM MISC: 3 refills | Status: DC

## 2021-05-02 MED ORDER — METFORMIN HCL ER 500 MG PO TB24: 500.0000 mg | ORAL_TABLET | Freq: Every day | ORAL | 1 refills | Status: DC

## 2021-05-02 NOTE — Chronic Care Management (AMB) (Signed)
Chronic Care Management Pharmacy Note  05/02/2021 Name:  Donna Howell MRN:  [id number] DOB:  Nov 16, 1950  Subjective: Donna Howell is an 70 y.o. year old female who is a primary patient of Einar Pheasant, MD.  The CCM team was consulted for assistance with disease management and care coordination needs.    Engaged with patient by telephone for  medication access  in response to provider referral for pharmacy case management and/or care coordination services.   Consent to Services:  The patient was given information about Chronic Care Management services, agreed to services, and gave verbal consent prior to initiation of services.  Please see initial visit note for detailed documentation.   Patient Care Team: Einar Pheasant, MD as PCP - General (Internal Medicine) De Hollingshead, RPH-CPP (Pharmacist)   Objective:  Lab Results  Component Value Date   CREATININE 0.83 02/20/2021   CREATININE 0.87 11/08/2020   CREATININE 0.88 06/27/2020    Lab Results  Component Value Date   HGBA1C 6.5 02/20/2021   Last diabetic Eye exam:  Lab Results  Component Value Date/Time   HMDIABEYEEXA No Retinopathy 05/28/2016 12:00 AM    Last diabetic Foot exam:  Lab Results  Component Value Date/Time   HMDIABFOOTEX on my exam.  11/20/2020 12:00 AM        Component Value Date/Time   CHOL 134 02/20/2021 0804   TRIG 72.0 02/20/2021 0804   HDL 78.30 02/20/2021 0804   CHOLHDL 2 02/20/2021 0804   VLDL 14.4 02/20/2021 0804   LDLCALC 41 02/20/2021 0804    Hepatic Function Latest Ref Rng & Units 02/20/2021 11/08/2020 06/27/2020  Total Protein 6.0 - 8.3 g/dL 6.7 6.8 7.2  Albumin 3.5 - 5.2 g/dL 4.4 4.2 4.8  AST 0 - 37 U/L _0 ALT 0 - 35 U/L _1 Alk Phosphatase 39 - 117 U/L 55 49 52  Total Bilirubin 0.2 - 1.2 mg/dL 0.7 0.6 0.7  Bilirubin, Direct 0.0 - 0.3 mg/dL 0.2 0.1 0.1    Lab Results  Component Value Date/Time   TSH 0.63 02/20/2021 08:04 AM   TSH 1.89 03/15/2020  08:01 AM    CBC Latest Ref Rng & Units 02/20/2021 03/15/2020 11/11/2019  WBC 4.0 - 10.5 K/uL 4.8 5.3 4.5  Hemoglobin 12.0 - 15.0 g/dL 12.4 13.2 12.7  Hematocrit 36.0 - 46.0 % 36.6 38.0 36.6  Platelets 150.0 - 400.0 K/uL 248.0 249.0 242.0    Social History   Tobacco Use  Smoking Status Never  Smokeless Tobacco Never   BP Readings from Last 3 Encounters:  04/30/21 122/84  03/26/21 112/70  11/20/20 120/70   Pulse Readings from Last 3 Encounters:  04/30/21 70  03/26/21 76  11/20/20 83   Wt Readings from Last 3 Encounters:  04/30/21 124 lb (56.2 kg)  03/26/21 122 lb 3.2 oz (55.4 kg)  02/08/21 124 lb (56.2 kg)    Assessment: Review of patient past medical history, allergies, medications, health status, including review of consultants reports, laboratory and other test data, was performed as part of comprehensive evaluation and provision of chronic care management services.   SDOH:  (Social Determinants of Health) assessments and interventions performed:  SDOH Interventions    Flowsheet Row Most Recent Value  SDOH Interventions   Financial Strain Interventions Intervention Not Indicated       CCM Care Plan  Allergies  Allergen Reactions   Contrast Media [Iodinated Diagnostic Agents] Other (See Comments)    Infection  Buspirone Hcl Palpitations   Penicillins Rash   Percocet [Oxycodone-Acetaminophen] Rash   Percodan [Oxycodone-Aspirin] Rash    Medications Reviewed Today     Reviewed by Baron Hamper, CMA (Certified Medical Assistant) on 04/30/21 at 71  Med List Status: <None>   Medication Order Taking? Sig Documenting Provider Last Dose Status Informant  aspirin 81 MG chewable tablet [id number] Yes Chew 81 mg by mouth daily. [provider] Taking Active   blood glucose meter kit and supplies KIT [id number] Yes Dispense based on patient and insurance preference. Use up to four times daily as directed. Einar Pheasant, MD Taking Active   eszopiclone  Johnnye Sima) 1 MG TABS tablet [id number] Yes Take 1 tablet (1 mg total) by mouth at bedtime as needed for sleep. Take immediately before bedtime Einar Pheasant, MD Taking Active   glucose blood (FREESTYLE LITE) test strip [id number] Yes Use to check blood sugars three times daily. E11.65 Einar Pheasant, MD Taking Active   losartan (COZAAR) 25 MG tablet [id number] Yes TAKE 1 TABLET DAILY Einar Pheasant, MD Taking Active   metFORMIN (GLUCOPHAGE-XR) 500 MG 24 hr tablet [id number] Yes Take 1 tablet (500 mg total) by mouth 2 (two) times daily. Einar Pheasant, MD Taking Active   metoprolol succinate (TOPROL-XL) 25 MG 24 hr tablet [id number] Yes TAKE 1 TABLET IN THE MORNING AND ONE-HALF TABLET (12.5 MG) AT BEDTIME Einar Pheasant, MD Taking Active   rosuvastatin (CRESTOR) 20 MG tablet [id number] Yes TAKE 1 TABLET DAILY Einar Pheasant, MD Taking Active   Semaglutide (RYBELSUS) 7 MG TABS [id number] Yes Take 7 mg by mouth daily. Einar Pheasant, MD Taking Active   SYNTHROID 47 MCG tablet [id number] Yes TAKE 1 TABLET DAILY Einar Pheasant, MD Taking Active             Patient Active Problem List   Diagnosis Date Noted   Abnormal mammogram 03/31/2021   Right leg pain 11/25/2020   Rectal lump 09/22/2020   Leg cramps 04/01/2020   Hypercholesterolemia 07/16/2019   Stress 07/16/2019   Environmental allergies 10/30/2018   Difficulty sleeping 06/26/2018   Fluttering heart 11/07/2017   Raynaud's disease 05/09/2017   Side pain 05/09/2017   Healthcare maintenance 12/06/2016   Back pain 11/09/2016   Colon cancer screening 11/09/2016   Type 2 diabetes mellitus with hyperglycemia ([place]) 11/06/2016   Hypertension, essential 11/06/2016   Hypothyroidism 11/06/2016    Immunization History  Administered Date(s) Administered   Influenza Whole 05/04/2017   Influenza,inj,Quad PF,6+ Mos 05/04/2017   Influenza-Unspecified 05/03/2018   Moderna Sars-Covid-2 Vaccination 08/30/2019, 09/27/2019, 04/28/2020, 10/26/2020    Pneumococcal Conjugate-13 08/01/2017   Pneumococcal Polysaccharide-23 10/26/2018   Td 07/11/2018   Zoster, Live 07/11/2013    Conditions to be addressed/monitored: HTN, HLD, and DMII  Care Plan : Medication Management  Updates made by De Hollingshead, RPH-CPP since 05/02/2021 12:00 AM     Problem: Diabetes, HTN, HLD      Long-Range Goal: Disease Progression Prevention   Start Date: 02/25/2021  Recent Progress: On track  Priority: High  Note:   Current Barriers:  Complex patient with multiple comorbidites  Pharmacist Clinical Goal(s):  Over the next 90 days, patient will maintain control of diabetes as evidenced by A1c  through collaboration with PharmD and provider.   Interventions: 1:1 collaboration with Einar Pheasant, MD regarding development and update of comprehensive plan of care as evidenced by provider attestation and co-signature Inter-disciplinary care team collaboration (see longitudinal plan of care) Comprehensive medication review performed; medication list updated in  electronic medical record  SDOH: Mother and special needs son lives with her. Breakfast is the only time she is away and spending time with her husband. Daily routines are hugely impacted by working around her mother's schedule.   Diabetes: Controlled; current treatment: metformin XR 500 mg BID; Rybelsus 7 mg daily Does note some occasional queasiness Does report improvement in symptoms of her bra fitting too tightly at the end of the day with the reduction in metformin dose.  Reports a history of glipizide, but severe hypoglycemia Current glucose readings: fastings: 70-90s; 2 hour post prandials: 110s Received approval letter for Ozempic through [employer]. Patient requests to go ahead and switch from Rybelsus. Stop Rybelsus, start Ozempic 0.25 mg weekly. Will avoid proactive titration at week 5 as patient is already concerned about side effects. Decrease metformin per patient  request to 500 mg daily.   Hypertension: Controlled; current treatment: losartan 25 mg daily, metoprolol succinate 25 mg QAM, 12.5 mg QPM;  Current home readings: will discuss moving forward.  Previously recommended to continue current regimen at this time  Hyperlipidemia: Controlled; current treatment: rosuvastatin 20 mg daily;  Previously recommended to continue current regimen at this time  Insomnia: Controlled per patient report; current regimen: eszopiclone 1 mg QPM Reports she gets ~ 3 hours of sleep, but is not tired during the day. Feels this is appropriate and adequate for her. Avoids daytime naps Previously recommended to continue current regimen at this time.   Hypothyroidism: Controlled per last lab value; current regimen: levothyroxine 50 mcg daily Previously discussed that patient takes about 30 minutes after breakfast. She has consistent breakfasts. If fluctuation in TSH moving forward, recommend advising to move to pre-breakfast time and wait 30 minutes before breakfast or other meals. Previously recommended to continue current regimen at this time   Patient Goals/Self-Care Activities Over the next 90 days, patient will:  - take medications as prescribed check glucose twice daily, document, and provide at future appointments  Follow Up Plan: Telephone follow up appointment with care management team member scheduled for: 4 weeks as previously scheduled      Medication Assistance: None required.  Patient affirms current coverage meets needs.  Patient's preferred pharmacy is:  [facility] [place], [facility] AT [address] [place] [address] Phone: [phone] Fax: [phone]  [facility] [place], [place] [address] [place] [address] Phone: [phone] Fax: [phone]    Follow Up:  Patient agrees to Care Plan and Follow-up.  Plan:  Telephone follow up appointment with care management team member scheduled for:  4 weeks  Catie Darnelle Maffucci, PharmD, [place], [place] Clinical Pharmacist [employer] at [employer] [phone]

## 2021-05-02 NOTE — Patient Instructions (Signed)
Visit Information  PATIENT GOALS:  Goals Addressed               This Visit's Progress     Patient Stated     Medication Monitoring (pt-stated)        Patient Goals/Self-Care Activities Over the next 90 days, patient will:  - take medications as prescribed check glucose twice daily, document, and provide at future appointments         Patient verbalizes understanding of instructions provided today and agrees to view in MyChart.   Plan: Telephone follow up appointment with care management team member scheduled for:  4 weeks  Catie Feliz Beam, PharmD, [place], CPP Clinical Pharmacist [employer] at [employer] [phone]

## 2021-05-02 NOTE — Telephone Encounter (Signed)
Patient is returning your call from the message below.Please advise.

## 2021-05-02 NOTE — Telephone Encounter (Signed)
Contacted patient to notify that PA for Ozempic has been approved per fax from [place]. Left voicemail for her to return my call at her convenience. Will also send MyChart

## 2021-05-03 ENCOUNTER — Other Ambulatory Visit: Payer: Self-pay | Admitting: Family

## 2021-05-03 ENCOUNTER — Telehealth: Payer: Self-pay | Admitting: Internal Medicine

## 2021-05-03 ENCOUNTER — Encounter: Payer: Self-pay | Admitting: Internal Medicine

## 2021-05-03 MED ORDER — CIPROFLOXACIN HCL 500 MG PO TABS: 500.0000 mg | ORAL_TABLET | Freq: Two times a day (BID) | ORAL | 0 refills | Status: AC

## 2021-05-03 NOTE — Telephone Encounter (Signed)
You have a lab result on this patient.

## 2021-05-03 NOTE — Telephone Encounter (Signed)
Patient does not understand her labs results in he MyChart. Please call her.

## 2021-05-06 NOTE — Telephone Encounter (Signed)
See result note.  

## 2021-05-06 NOTE — Telephone Encounter (Signed)
Placed call to pt to discuss results.

## 2021-05-07 NOTE — Telephone Encounter (Signed)
Patient calling back in and states that she has been taking the antibiotics for the UTI. States still is having symptoms.   Still having tingling in the groin. Some pelvic pressure as well. Symptoms have gotten better but has not gone away. Takes her last pill tomorrow.

## 2021-05-07 NOTE — Telephone Encounter (Signed)
Need to clarify a few things.  When she was seen, she was placed on macrobid.  Per phone message on 05/03/21, a message was sent to change to cipro.  I am not sure if she was made aware of this change and if so, when she was aware.  Did she stop macrobid and start cipro.  If so, when did she start on cipro?

## 2021-05-07 NOTE — Telephone Encounter (Signed)
Patient stated that her symptoms are better since she started abx but does not feel like the abx has cleared the infection. She has one more day. Confirmed no new symptoms. Ok to refill the abx that padonda prescribed and have her be re-evaluated if symptoms do not resolve?

## 2021-05-07 NOTE — Telephone Encounter (Signed)
Stopped the macrobid on 10/7. Started cipro 10/8. She was notified via mychart and changed abx. She has 3 pills of the cipro left. One for tonight and 2 for tomorrow

## 2021-05-07 NOTE — Telephone Encounter (Signed)
Have her complete the full course of abx since she is better and call us with update.

## 2021-05-08 ENCOUNTER — Telehealth: Payer: Medicare Other

## 2021-05-09 NOTE — Telephone Encounter (Signed)
Patient states that she is having some tingling at the beginning urination and a bit of urinary urgency this morning. States her symptoms are at least 90% better.   No pain, fever, or blood in the urine. Took her last pill last night, 05/08/21.

## 2021-05-10 ENCOUNTER — Other Ambulatory Visit: Payer: Self-pay

## 2021-05-10 MED ORDER — CIPROFLOXACIN HCL 500 MG PO TABS: 500.0000 mg | ORAL_TABLET | Freq: Two times a day (BID) | ORAL | 0 refills | Status: AC

## 2021-05-10 NOTE — Telephone Encounter (Signed)
Can send three more days of cipro 500mg  bid.  Should take care of infection.  If persistent problems, will need to be reevaluated.

## 2021-05-10 NOTE — Telephone Encounter (Signed)
Cipro sent in. Patient is aware.

## 2021-05-10 NOTE — Telephone Encounter (Signed)
Patient was advised to send update for UTI after completion of abx. Finished her cipro 05/08/21. Symptoms have still not resolved but are better than they were. She is wondering if she needs one more round of cipro

## 2021-05-14 ENCOUNTER — Other Ambulatory Visit: Payer: Self-pay | Admitting: Internal Medicine

## 2021-05-14 DIAGNOSIS — S46111A Strain of muscle, fascia and tendon of long head of biceps, right arm, initial encounter: Secondary | ICD-10-CM | POA: Diagnosis not present

## 2021-05-14 DIAGNOSIS — S46011A Strain of muscle(s) and tendon(s) of the rotator cuff of right shoulder, initial encounter: Secondary | ICD-10-CM | POA: Diagnosis not present

## 2021-05-14 DIAGNOSIS — M25511 Pain in right shoulder: Secondary | ICD-10-CM | POA: Diagnosis not present

## 2021-05-14 NOTE — Telephone Encounter (Signed)
RX Refill:lunesta Last Seen:03-26-21 Last ordered:03-14-21 Next appt:06-14-21

## 2021-05-18 DIAGNOSIS — Z20822 Contact with and (suspected) exposure to covid-19: Secondary | ICD-10-CM | POA: Diagnosis not present

## 2021-05-20 DIAGNOSIS — M25511 Pain in right shoulder: Secondary | ICD-10-CM | POA: Diagnosis not present

## 2021-05-22 ENCOUNTER — Telehealth: Payer: Self-pay | Admitting: Internal Medicine

## 2021-05-22 ENCOUNTER — Telehealth: Payer: Self-pay

## 2021-05-22 DIAGNOSIS — E1165 Type 2 diabetes mellitus with hyperglycemia: Secondary | ICD-10-CM

## 2021-05-22 NOTE — Chronic Care Management (AMB) (Signed)
  Care Management   Note  05/22/2021 Name: [name] MRN: [id number] DOB: Dec 09, 1950  Donna Howell is a 70 y.o. year old female who is a primary care patient of Dale [place], MD and is actively engaged with the care management team. I reached out to [name] by phone today to assist with re-scheduling a follow up visit with the Pharmacist  Follow up plan: Unsuccessful telephone outreach attempt made. A HIPAA compliant phone message was left for the patient providing contact information and requesting a return call.  The care management team will reach out to the patient again over the next 7 days.  If patient returns call to provider office, please advise to call Embedded Care Management Care Guide Penne Lash  at [phone]  Penne Lash, RMA Care Guide, Embedded Care Coordination [facility]  [place], [place] [address] Direct Dial: [phone] [email or web address]: [email or web address]

## 2021-05-22 NOTE — Telephone Encounter (Signed)
Patient called and said Donna Howell called and wanted to schedule patient 4 to 6 weeks out with Catie. Patient does not see Dr Lorin Picket until 06/14/21, she only has 2 weeks remaining of the Ozempic and she will be out.

## 2021-05-22 NOTE — Telephone Encounter (Signed)
Amber - please call patient to r/s our appointment to 4-6 weeks after PCP visit, let her know I previously sent a script for Ozempic to her pharmacy that she can fill before the appointment with Dr. Lorin Picket.

## 2021-05-28 ENCOUNTER — Telehealth: Payer: Self-pay

## 2021-05-28 NOTE — Chronic Care Management (AMB) (Signed)
  Care Management   Note  05/28/2021 Name: Loren Vicens MRN: [id number] DOB: [birth year]  [place] Elling is a 70 y.o. year old female who is a primary care patient of Dale , MD and is actively engaged with the care management team. I reached out to [name] by phone today to assist with re-scheduling a follow up visit with the Pharmacist  Follow up plan: Patient declines further follow up and engagement by the care management team due to copay. Appropriate care team members and provider have been notified via electronic communication.   Penne Lash, RMA Care Guide, Embedded Care Coordination [facility]  [place], [place] [address] Direct Dial: [phone] [email or web address]

## 2021-05-28 NOTE — Telephone Encounter (Signed)
Pt declined due to copay

## 2021-06-04 NOTE — Chronic Care Management (AMB) (Signed)
  Care Management   Note  06/04/2021 Name: Chalonda Schlatter MRN: [id number] DOB: Dec 10, 1950  Antonetta Clanton is a 70 y.o. year old female who is a primary care patient of Dale [place], MD and is actively engaged with the care management team. I reached out to [name] by phone today to assist with re-scheduling a follow up visit with the Pharmacist  Follow up plan: Patient declines further follow up and engagement by the care management team due to copay. Appropriate care team members and provider have been notified via electronic communication.   Penne Lash, RMA Care Guide, Embedded Care Coordination [facility]  [place], [place] [address] Direct Dial: [phone] [email or web address]: [email or web address]

## 2021-06-07 ENCOUNTER — Telehealth: Payer: Medicare Other

## 2021-06-14 ENCOUNTER — Ambulatory Visit (INDEPENDENT_AMBULATORY_CARE_PROVIDER_SITE_OTHER): Payer: Medicare Other | Admitting: Internal Medicine

## 2021-06-14 ENCOUNTER — Encounter: Payer: Self-pay | Admitting: Internal Medicine

## 2021-06-14 ENCOUNTER — Other Ambulatory Visit: Payer: Self-pay

## 2021-06-14 DIAGNOSIS — F439 Reaction to severe stress, unspecified: Secondary | ICD-10-CM

## 2021-06-14 DIAGNOSIS — E039 Hypothyroidism, unspecified: Secondary | ICD-10-CM

## 2021-06-14 DIAGNOSIS — I10 Essential (primary) hypertension: Secondary | ICD-10-CM

## 2021-06-14 DIAGNOSIS — E1165 Type 2 diabetes mellitus with hyperglycemia: Secondary | ICD-10-CM | POA: Diagnosis not present

## 2021-06-14 DIAGNOSIS — E7800 Pure hypercholesterolemia, unspecified: Secondary | ICD-10-CM

## 2021-06-14 DIAGNOSIS — M25519 Pain in unspecified shoulder: Secondary | ICD-10-CM | POA: Diagnosis not present

## 2021-06-14 DIAGNOSIS — I7300 Raynaud's syndrome without gangrene: Secondary | ICD-10-CM | POA: Diagnosis not present

## 2021-06-14 MED ORDER — AMLODIPINE BESYLATE 2.5 MG PO TABS: 2.5000 mg | ORAL_TABLET | Freq: Every day | ORAL | 2 refills | Status: DC

## 2021-06-14 NOTE — Progress Notes (Signed)
Patient ID: Donna Howell, female   DOB: 1951/05/23, 70 y.o.   MRN: [id number]   Subjective:    Patient ID: Donna Howell, female    DOB: November 17, 1950, 70 y.o.   MRN: [id number]  This visit occurred during the SARS-CoV-2 public health emergency.  Safety protocols were in place, including screening questions prior to the visit, additional usage of staff PPE, and extensive cleaning of exam room while observing appropriate contact time as indicated for disinfecting solutions.   Patient here for a scheduled follow up.   Marland Kitchen   HPI Here to follow up regarding blood pressure, blood sugar and cholesterol.  Reports having persistent problems with her shoulder.  She has been evaluated at [facility] - felt pain likely related to impingement and chronic distal clavicle fracture.  She request today to be evaluated by ortho- for another opinion - given persistent pain. Persistent increased stress.  Discussed.  Does not desire any further intervention at this time.  Takes lunesta to help her sleep.  She is on ozempic.  Wants to increased dose to .67m.  she would like sugar to be better controlled.  Watches her diet.  Exercises.  No chest pain or sob reported.  Appetite is better.  No abdominal pain reported.  Bowels stable.    Past Medical History:  Diagnosis Date   Arthritis    Chicken pox    Diabetes (HCC)    Frequent headaches    GERD (gastroesophageal reflux disease)    Hay fever    Heart murmur    High blood pressure    History of bronchitis    Hypothyroidism    Past Surgical History:  Procedure Laterality Date   APPENDECTOMY  1974   BREAST BIOPSY Left 01/16/2021   Affirm bx-"Ribbon" clip-path pending   TONSILLECTOMY AND ADENOIDECTOMY  1962   Family History  Problem Relation Age of Onset   Arthritis Mother    Heart disease Mother    Diabetes Mother    Stroke Father    Arthritis Sister    Breast cancer Sister 552  Alcoholism Brother    Arthritis Maternal Grandmother    Heart disease  Maternal Grandmother    Diabetes Maternal Grandmother    Social History   Socioeconomic History   Marital status: Married    Spouse name: Not on file   Number of children: Not on file   Years of education: Not on file   Highest education level: Not on file  Occupational History   Not on file  Tobacco Use   Smoking status: Never   Smokeless tobacco: Never  Substance and Sexual Activity   Alcohol use: No   Drug use: No   Sexual activity: Not on file  Other Topics Concern   Not on file  Social History Narrative   Not on file   Social Determinants of Health   Financial Resource Strain: Low Risk    Difficulty of Paying Living Expenses: Not hard at all  Food Insecurity: No Food Insecurity   Worried About RCharity fundraiserin the Last Year: Never true   [place] the Last Year: Never true  Transportation Needs: No Transportation Needs   Lack of Transportation (Medical): No   Lack of Transportation (Non-Medical): No  Physical Activity: Sufficiently Active   Days of Exercise per Week: 7 days   Minutes of Exercise per Session: 60 min  Stress: No Stress Concern Present   Feeling of Stress : Not at all  Social Connections: Unknown   Frequency of Communication with Friends and Family: Not on file   Frequency of Social Gatherings with Friends and Family: Not on file   Attends Religious Services: Not on file   Active Member of Clubs or Organizations: Not on file   Attends Archivist Meetings: Not on file   Marital Status: Married     Review of Systems  Constitutional:  Negative for unexpected weight change.       Appetite better.   HENT:  Negative for congestion and sinus pressure.   Respiratory:  Negative for cough, chest tightness and shortness of breath.   Cardiovascular:  Negative for chest pain, palpitations and leg swelling.  Gastrointestinal:  Negative for abdominal pain, diarrhea, nausea and vomiting.  Genitourinary:  Negative for difficulty  urinating and dysuria.  Musculoskeletal:  Negative for joint swelling and myalgias.       Shoulder pain as outlined.   Skin:  Negative for color change and rash.  Neurological:  Negative for dizziness, light-headedness and headaches.  Psychiatric/Behavioral:  Negative for agitation and dysphoric mood.       Objective:     BP 114/62   Pulse 66   Temp 97.8 F (36.6 C) (Skin)   Ht 5' 5"  (1.651 m)   Wt 122 lb 3.2 oz (55.4 kg)   SpO2 96%   BMI 20.34 kg/m  Wt Readings from Last 3 Encounters:  06/14/21 122 lb 3.2 oz (55.4 kg)  04/30/21 124 lb (56.2 kg)  03/26/21 122 lb 3.2 oz (55.4 kg)    Physical Exam Vitals reviewed.  Constitutional:      General: She is not in acute distress.    Appearance: Normal appearance.  HENT:     Head: Normocephalic and atraumatic.     Right Ear: External ear normal.     Left Ear: External ear normal.  Eyes:     General: No scleral icterus.       Right eye: No discharge.        Left eye: No discharge.     Conjunctiva/sclera: Conjunctivae normal.  Neck:     Thyroid: No thyromegaly.  Cardiovascular:     Rate and Rhythm: Normal rate and regular rhythm.  Pulmonary:     Effort: No respiratory distress.     Breath sounds: Normal breath sounds. No wheezing.  Abdominal:     General: Bowel sounds are normal.     Palpations: Abdomen is soft.     Tenderness: There is no abdominal tenderness.  Musculoskeletal:        General: No swelling or tenderness.     Cervical back: Neck supple. No tenderness.  Lymphadenopathy:     Cervical: No cervical adenopathy.  Skin:    Findings: No erythema or rash.  Neurological:     Mental Status: She is alert.  Psychiatric:        Mood and Affect: Mood normal.        Behavior: Behavior normal.     Outpatient Encounter Medications as of 06/14/2021  Medication Sig   amLODipine (NORVASC) 2.5 MG tablet Take 1 tablet (2.5 mg total) by mouth daily.   aspirin 81 MG chewable tablet Chew 81 mg by mouth daily.   blood  glucose meter kit and supplies KIT Dispense based on patient and insurance preference. Use up to four times daily as directed.   eszopiclone (LUNESTA) 1 MG TABS tablet TAKE ONE TABLET BY MOUTH AT BEDTIME AS NEEDED FOR SLEEP (IMMEDIATELY BEFORE  BEDTIME)   glucose blood (FREESTYLE LITE) test strip Use to check blood sugars three times daily. E11.65   Insulin Pen Needle (PEN NEEDLES) 33G X 4 MM MISC Use to inject Ozempic   metFORMIN (GLUCOPHAGE-XR) 500 MG 24 hr tablet Take 1 tablet (500 mg total) by mouth daily.   metoprolol succinate (TOPROL-XL) 25 MG 24 hr tablet TAKE 1 TABLET IN THE MORNING AND ONE-HALF TABLET (12.5 MG) AT BEDTIME   rosuvastatin (CRESTOR) 20 MG tablet TAKE 1 TABLET DAILY   SYNTHROID 50 MCG tablet TAKE 1 TABLET DAILY   [DISCONTINUED] losartan (COZAAR) 25 MG tablet TAKE 1 TABLET DAILY   [DISCONTINUED] Semaglutide,0.25 or 0.5MG/DOS, (OZEMPIC, 0.25 OR 0.5 MG/DOSE,) 2 MG/1.5ML SOPN Inject 0.25 mg into the skin once a week.   No facility-administered encounter medications on file as of 06/14/2021.     Lab Results  Component Value Date   WBC 4.8 02/20/2021   HGB 12.4 02/20/2021   HCT 36.6 02/20/2021   PLT 248.0 02/20/2021   GLUCOSE 100 (H) 02/20/2021   CHOL 134 02/20/2021   TRIG 72.0 02/20/2021   HDL 78.30 02/20/2021   LDLCALC 41 02/20/2021   ALT 17 02/20/2021   AST 24 02/20/2021   NA 134 (L) 02/20/2021   K 4.3 02/20/2021   CL 98 02/20/2021   CREATININE 0.83 02/20/2021   BUN 10 02/20/2021   CO2 30 02/20/2021   TSH 0.63 02/20/2021   HGBA1C 6.5 02/20/2021   MICROALBUR <0.7 11/08/2020    MM CLIP PLACEMENT LEFT  Result Date: 01/16/2021 CLINICAL DATA:  Status post stereotactic biopsy EXAM: DIAGNOSTIC LEFT MAMMOGRAM POST STEREOTACTIC BIOPSY COMPARISON:  Previous exam(s). FINDINGS: Mammographic images were obtained following stereotactic guided biopsy of a LEFT breast asymmetry with possible associated distortion. The RIBBON biopsy marking clip is along the medial aspect of  the asymmetry. IMPRESSION: Appropriate positioning of the RIBBON shaped biopsy marking clip at the site of biopsy in the LEFT upper outer breast. Final Assessment: Post Procedure Mammograms for Marker Placement Electronically Signed   By: Valentino Saxon MD   On: 01/16/2021 10:53  MM LT BREAST BX W LOC DEV 1ST LESION IMAGE BX SPEC STEREO GUIDE  Addendum Date: 01/17/2021   ADDENDUM REPORT: 01/17/2021 11:53 ADDENDUM: PATHOLOGY revealed: A. BREAST ASYMMETRY, LEFT UPPER OUTER; STEREOTACTIC BIOPSY: - PREDOMINANTLY BENIGN ADIPOSE TISSUE. - AREAS OF STROMAL FIBROSIS AND MAMMARY GLANDS WITH ATROPHIC AND APOCRINE CHANGES. - NEGATIVE FOR ATYPIA AND MALIGNANCY. Pathology results are CONCORDANT with imaging findings, per Dr. Valentino Saxon. 12 stereotactic cores were obtained at time of biopsy. Pathology results and recommendations were discussed with patient via telephone on 01/17/2021. Patient reported doing well after the biopsy with no adverse symptoms, and only slight tenderness at the site. Post biopsy care instructions were reviewed, questions were answered and my direct phone number was provided. Patient was instructed to call [facility] for any additional questions or concerns related to biopsy site. Recommendation: Patient instructed to continue monthly self breast examinations, clinical follow-up with provider with any subsequent concerns, and return for annual bilateral screening mammogram next year due June 2023. Pathology results reported by Electa Sniff RN on 01/17/2021. Electronically Signed   By: Valentino Saxon MD   On: 01/17/2021 11:53   Result Date: 01/17/2021 CLINICAL DATA:  Possible asymmetry with questionable architectural distortion EXAM: LEFT BREAST STEREOTACTIC CORE NEEDLE BIOPSY COMPARISON:  Previous exams. FINDINGS: The patient and I discussed the procedure of stereotactic-guided biopsy including benefits and alternatives. We discussed the high likelihood of  a successful  procedure. We discussed the risks of the procedure including infection, bleeding, tissue injury, clip migration, and inadequate sampling. Informed written consent was given. The usual time out protocol was performed immediately prior to the procedure. Using sterile technique and 1% lidocaine and 1% lidocaine with epinephrine as local anesthetic, under stereotactic guidance, a 9 gauge vacuum assisted device was used to perform core needle biopsy of an asymmetry in the LEFT upper outer quadrant using a LMO approach. Specimen radiograph was performed showing representative tissue. Lesion quadrant: Upper outer quadrant At the conclusion of the procedure, a RIBBON tissue marker clip was deployed into the biopsy cavity. Follow-up 2-view mammogram was performed and dictated separately. IMPRESSION: Stereotactic-guided biopsy of a LEFT breast asymmetry. No apparent complications. Electronically Signed: By: Valentino Saxon MD On: 01/16/2021 11:07      Assessment & Plan:   Problem List Items Addressed This Visit     Hypercholesterolemia    Continue crestor.  Follow lipid panel and liver function tests.        Relevant Medications   amLODipine (NORVASC) 2.5 MG tablet   Hypertension, essential    Continue metoprolol.  Stop losartan.  Start amlodipine 2.36m q day - given Raynauds.  Blood pressure has been doing well.  Follow pressures.  Follow metabolic panel.       Relevant Medications   amLODipine (NORVASC) 2.5 MG tablet   Hypothyroidism    On thyroid replacement.  Follow tsh.       Raynaud's disease    Has a history of raynaud's.  Had questions about medication.  Worsening symptoms - cold weather.  Discussed starting amlodipine.  Will start amlodipine 2.539mq day.  Off losartan.  Follow pressures.  Monitor symptoms.        Relevant Medications   amLODipine (NORVASC) 2.5 MG tablet   Shoulder pain    Persistent shoulder pain as outlined.  Refer to ortho for further evaluation and treatment.         Relevant Orders   Ambulatory referral to Orthopedic Surgery   Stress    Increased stress.  Discussed.  She desires no further intervention at this time.  Follow.       Type 2 diabetes mellitus with hyperglycemia (HCC)    On metformin and ozempic.  Tolerating.  She reports appetite is better.  Wants to increase dose of ozempic to .89m16mor better sugar control.  Discussed.  Will increase the dose.  Follow sugars. Monitor for GI side effects.  Follow met b and a1c.         ChaEinar PheasantD

## 2021-06-18 ENCOUNTER — Telehealth: Payer: Self-pay | Admitting: Internal Medicine

## 2021-06-18 DIAGNOSIS — E1165 Type 2 diabetes mellitus with hyperglycemia: Secondary | ICD-10-CM

## 2021-06-18 MED ORDER — OZEMPIC (0.25 OR 0.5 MG/DOSE) 2 MG/1.5ML ~~LOC~~ SOPN: 0.2500 mg | PEN_INJECTOR | SUBCUTANEOUS | 2 refills | Status: DC

## 2021-06-18 MED ORDER — OZEMPIC (0.25 OR 0.5 MG/DOSE) 2 MG/1.5ML ~~LOC~~ SOPN: 0.5000 mg | PEN_INJECTOR | SUBCUTANEOUS | 2 refills | Status: DC

## 2021-06-18 NOTE — Addendum Note (Signed)
Addended by: Larry Sierras on: 06/18/2021 02:50 PM   Modules accepted: Orders

## 2021-06-18 NOTE — Telephone Encounter (Signed)
Rx changed to 0.5 mg q week

## 2021-06-18 NOTE — Addendum Note (Signed)
Addended by: Elise Benne T on: 06/18/2021 01:02 PM   Modules accepted: Orders

## 2021-06-18 NOTE — Telephone Encounter (Signed)
Please send prescription for  Semaglutide,0.25 or 0.5MG /DOS, (OZEMPIC, 0.5 MG/DOSE,) 2 MG/1.5ML SOPN to [facility].

## 2021-06-18 NOTE — Telephone Encounter (Signed)
Change dose to .5

## 2021-06-22 ENCOUNTER — Encounter: Payer: Self-pay | Admitting: Internal Medicine

## 2021-06-22 DIAGNOSIS — M25519 Pain in unspecified shoulder: Secondary | ICD-10-CM | POA: Insufficient documentation

## 2021-06-22 NOTE — Assessment & Plan Note (Signed)
Has a history of raynaud's.  Had questions about medication.  Worsening symptoms - cold weather.  Discussed starting amlodipine.  Will start amlodipine 2.5mg  q day.  Off losartan.  Follow pressures.  Monitor symptoms.

## 2021-06-22 NOTE — Assessment & Plan Note (Signed)
Continue crestor.  Follow lipid panel and liver function tests.  

## 2021-06-22 NOTE — Assessment & Plan Note (Signed)
Persistent shoulder pain as outlined.  Refer to ortho for further evaluation and treatment.

## 2021-06-22 NOTE — Assessment & Plan Note (Signed)
Increased stress.  Discussed.  She desires no further intervention at this time.  Follow.

## 2021-06-22 NOTE — Assessment & Plan Note (Signed)
Continue metoprolol.  Stop losartan.  Start amlodipine 2.5mg  q day - given Raynauds.  Blood pressure has been doing well.  Follow pressures.  Follow metabolic panel.

## 2021-06-22 NOTE — Assessment & Plan Note (Signed)
On metformin and ozempic.  Tolerating.  She reports appetite is better.  Wants to increase dose of ozempic to .75m for better sugar control.  Discussed.  Will increase the dose.  Follow sugars. Monitor for GI side effects.  Follow met b and a1c.

## 2021-06-22 NOTE — Assessment & Plan Note (Signed)
On thyroid replacement.  Follow tsh.  

## 2021-06-24 ENCOUNTER — Encounter: Payer: Self-pay | Admitting: Internal Medicine

## 2021-06-24 DIAGNOSIS — E119 Type 2 diabetes mellitus without complications: Secondary | ICD-10-CM

## 2021-06-24 DIAGNOSIS — E1165 Type 2 diabetes mellitus with hyperglycemia: Secondary | ICD-10-CM

## 2021-06-25 MED ORDER — FREESTYLE LITE TEST VI STRP: ORAL_STRIP | 3 refills | Status: DC

## 2021-06-25 MED ORDER — OZEMPIC (0.25 OR 0.5 MG/DOSE) 2 MG/1.5ML ~~LOC~~ SOPN: 0.5000 mg | PEN_INJECTOR | SUBCUTANEOUS | 2 refills | Status: DC

## 2021-06-26 ENCOUNTER — Other Ambulatory Visit (INDEPENDENT_AMBULATORY_CARE_PROVIDER_SITE_OTHER): Payer: Medicare Other

## 2021-06-26 ENCOUNTER — Other Ambulatory Visit: Payer: Self-pay

## 2021-06-26 DIAGNOSIS — E7800 Pure hypercholesterolemia, unspecified: Secondary | ICD-10-CM | POA: Diagnosis not present

## 2021-06-26 DIAGNOSIS — I10 Essential (primary) hypertension: Secondary | ICD-10-CM | POA: Diagnosis not present

## 2021-06-26 DIAGNOSIS — E119 Type 2 diabetes mellitus without complications: Secondary | ICD-10-CM | POA: Diagnosis not present

## 2021-06-26 LAB — HEPATIC FUNCTION PANEL
ALT: 20 U/L (ref 0–35)
AST: 26 U/L (ref 0–37)
Albumin: 4.5 g/dL (ref 3.5–5.2)
Alkaline Phosphatase: 47 U/L (ref 39–117)
Bilirubin, Direct: 0.2 mg/dL (ref 0.0–0.3)
Total Bilirubin: 0.7 mg/dL (ref 0.2–1.2)
Total Protein: 6.6 g/dL (ref 6.0–8.3)

## 2021-06-26 LAB — BASIC METABOLIC PANEL
BUN: 10 mg/dL (ref 6–23)
CO2: 28 mEq/L (ref 19–32)
Calcium: 9.6 mg/dL (ref 8.4–10.5)
Chloride: 101 mEq/L (ref 96–112)
Creatinine, Ser: 0.92 mg/dL (ref 0.40–1.20)
GFR: 63.33 mL/min (ref >60.00)
Glucose, Bld: 108 mg/dL — ABNORMAL HIGH (ref 70–99)
Potassium: 4.2 mEq/L (ref 3.5–5.1)
Sodium: 136 mEq/L (ref 135–145)

## 2021-06-26 LAB — LIPID PANEL
Cholesterol: 134 mg/dL (ref 0–200)
HDL: 82.2 mg/dL (ref >39.00)
LDL Cholesterol: 41 mg/dL (ref 0–99)
NonHDL: 51.85
Total CHOL/HDL Ratio: 2
Triglycerides: 56 mg/dL (ref 0.0–149.0)
VLDL: 11.2 mg/dL (ref 0.0–40.0)

## 2021-06-26 LAB — HEMOGLOBIN A1C: Hgb A1c MFr Bld: 6.4 % (ref 4.6–6.5)

## 2021-07-04 ENCOUNTER — Encounter: Payer: Self-pay | Admitting: Internal Medicine

## 2021-07-05 NOTE — Telephone Encounter (Signed)
Ok to change back to losartan.  Let us know if symptoms persist.

## 2021-07-05 NOTE — Telephone Encounter (Signed)
Patient says she has just felt tired and no energy on amlodipine asking if she can go back on losartan. Fingers feel better on amlodipine but the other feeling of being tired she feels is coming from the amlodipine. Also hoping stomach upset with ozempic will ease off.

## 2021-07-12 ENCOUNTER — Other Ambulatory Visit: Payer: Self-pay | Admitting: Internal Medicine

## 2021-07-12 NOTE — Telephone Encounter (Signed)
RX Refill: lunesta Last Seen: 06-14-21 Last Ordered: 05-14-21 Next Appt: 08-02-21

## 2021-07-25 ENCOUNTER — Other Ambulatory Visit: Payer: Self-pay | Admitting: Internal Medicine

## 2021-07-31 ENCOUNTER — Other Ambulatory Visit: Payer: Self-pay

## 2021-07-31 ENCOUNTER — Encounter: Payer: Self-pay | Admitting: Internal Medicine

## 2021-07-31 DIAGNOSIS — E1165 Type 2 diabetes mellitus with hyperglycemia: Secondary | ICD-10-CM

## 2021-07-31 MED ORDER — OZEMPIC (0.25 OR 0.5 MG/DOSE) 2 MG/1.5ML ~~LOC~~ SOPN: 0.5000 mg | PEN_INJECTOR | SUBCUTANEOUS | 1 refills | Status: DC

## 2021-08-02 ENCOUNTER — Other Ambulatory Visit: Payer: Self-pay

## 2021-08-02 ENCOUNTER — Ambulatory Visit (INDEPENDENT_AMBULATORY_CARE_PROVIDER_SITE_OTHER): Payer: Medicare Other | Admitting: Internal Medicine

## 2021-08-02 ENCOUNTER — Encounter: Payer: Self-pay | Admitting: Internal Medicine

## 2021-08-02 DIAGNOSIS — E1165 Type 2 diabetes mellitus with hyperglycemia: Secondary | ICD-10-CM

## 2021-08-02 DIAGNOSIS — E7800 Pure hypercholesterolemia, unspecified: Secondary | ICD-10-CM | POA: Diagnosis not present

## 2021-08-02 DIAGNOSIS — R300 Dysuria: Secondary | ICD-10-CM | POA: Insufficient documentation

## 2021-08-02 DIAGNOSIS — I7300 Raynaud's syndrome without gangrene: Secondary | ICD-10-CM | POA: Diagnosis not present

## 2021-08-02 DIAGNOSIS — E039 Hypothyroidism, unspecified: Secondary | ICD-10-CM | POA: Diagnosis not present

## 2021-08-02 DIAGNOSIS — G479 Sleep disorder, unspecified: Secondary | ICD-10-CM

## 2021-08-02 DIAGNOSIS — M25519 Pain in unspecified shoulder: Secondary | ICD-10-CM | POA: Diagnosis not present

## 2021-08-02 DIAGNOSIS — I10 Essential (primary) hypertension: Secondary | ICD-10-CM | POA: Diagnosis not present

## 2021-08-02 DIAGNOSIS — F439 Reaction to severe stress, unspecified: Secondary | ICD-10-CM | POA: Diagnosis not present

## 2021-08-02 LAB — URINALYSIS, ROUTINE W REFLEX MICROSCOPIC
Bilirubin Urine: NEGATIVE
Hgb urine dipstick: NEGATIVE
Ketones, ur: NEGATIVE
Leukocytes,Ua: NEGATIVE
Nitrite: NEGATIVE
RBC / HPF: NONE SEEN (ref 0–?)
Specific Gravity, Urine: <=1.005 — AB (ref 1.000–1.030)
Total Protein, Urine: NEGATIVE
Urine Glucose: NEGATIVE
Urobilinogen, UA: 0.2 (ref 0.0–1.0)
WBC, UA: NONE SEEN (ref 0–?)
pH: 6 (ref 5.0–8.0)

## 2021-08-02 MED ORDER — OZEMPIC (0.25 OR 0.5 MG/DOSE) 2 MG/1.5ML ~~LOC~~ SOPN: 0.5000 mg | PEN_INJECTOR | SUBCUTANEOUS | 1 refills | Status: DC

## 2021-08-02 MED ORDER — ESZOPICLONE 2 MG PO TABS: 2.0000 mg | ORAL_TABLET | Freq: Every evening | ORAL | 2 refills | Status: DC | PRN

## 2021-08-02 MED ORDER — LOSARTAN POTASSIUM 25 MG PO TABS: 25.0000 mg | ORAL_TABLET | Freq: Every day | ORAL | 1 refills | Status: DC

## 2021-08-02 NOTE — Progress Notes (Signed)
Patient ID: Donna Howell, female   DOB: [birth year], 71 y.o.   MRN: [id number]   Subjective:    Patient ID: Donna Howell, female    DOB: May 25, 1951, 71 y.o.   MRN: [id number]  This visit occurred during the SARS-CoV-2 public health emergency.  Safety protocols were in place, including screening questions prior to the visit, additional usage of staff PPE, and extensive cleaning of exam room while observing appropriate contact time as indicated for disinfecting solutions.   Patient here for a scheduled follow up.     HPI Here to follow up regarding her blood pressure, blood sugar and cholesterol.  Still with increased stress.  Discussed.  Not sleeping well.  Taking lunesta.  Only sleeping a couple of hours per night.  Taking 47m.  Would like to increase to 270mtablets.  Tolerates without adverse effects.  Stays active.  Not exercising as much now - due to family issues, but still very active.  No chest pain or sob with increased activity.  No acid reflux.  Off amlodipine.  Was started for Raynauds.  Stopped due to intolerance.  Back on losartan.  Increased shoulder pain.  Has been evaluated.  Per report, needs surgery.  Needs to postpone due to taking care of her family.  Increased pain - keeps her awake at night.  Taking ozempic - .86m71mose.  Tolerating.  No significant adverse effects.  Discussed.  Discussed the need to eat regular meals.  Does not need to lose weight.  AM sugars 87-90s and two hours after breakfast - 99.     Past Medical History:  Diagnosis Date   Arthritis    Chicken pox    Diabetes (HCC)    Frequent headaches    GERD (gastroesophageal reflux disease)    Hay fever    Heart murmur    High blood pressure    History of bronchitis    Hypothyroidism    Past Surgical History:  Procedure Laterality Date   APPENDECTOMY  1974   BREAST BIOPSY Left 01/16/2021   Affirm bx-"Ribbon" clip-path pending   TONSILLECTOMY AND ADENOIDECTOMY  1962   Family History  Problem  Relation Age of Onset   Arthritis Mother    Heart disease Mother    Diabetes Mother    Stroke Father    Arthritis Sister    Breast cancer Sister 55 55Alcoholism Brother    Arthritis Maternal Grandmother    Heart disease Maternal Grandmother    Diabetes Maternal Grandmother    Social History   Socioeconomic History   Marital status: Married    Spouse name: Not on file   Number of children: Not on file   Years of education: Not on file   Highest education level: Not on file  Occupational History   Not on file  Tobacco Use   Smoking status: Never   Smokeless tobacco: Never  Substance and Sexual Activity   Alcohol use: No   Drug use: No   Sexual activity: Not on file  Other Topics Concern   Not on file  Social History Narrative   Not on file   Social Determinants of Health   Financial Resource Strain: Low Risk    Difficulty of Paying Living Expenses: Not hard at all  Food Insecurity: No Food Insecurity   Worried About Run[employer] fundraiser the Last Year: Never true   RanMoclips the Last Year: Never true  Transportation Needs: No Transportation Needs  Lack of Transportation (Medical): No   Lack of Transportation (Non-Medical): No  Physical Activity: Sufficiently Active   Days of Exercise per Week: 7 days   Minutes of Exercise per Session: 60 min  Stress: No Stress Concern Present   Feeling of Stress : Not at all  Social Connections: Unknown   Frequency of Communication with Friends and Family: Not on file   Frequency of Social Gatherings with Friends and Family: Not on file   Attends Religious Services: Not on file   Active Member of Clubs or Organizations: Not on file   Attends Archivist Meetings: Not on file   Marital Status: Married     Review of Systems  Constitutional:  Positive for fatigue. Negative for fever.  HENT:  Negative for congestion and sinus pressure.   Respiratory:  Negative for cough, chest tightness and shortness of  breath.   Cardiovascular:  Negative for chest pain, palpitations and leg swelling.  Gastrointestinal:  Negative for abdominal pain, diarrhea, nausea and vomiting.  Genitourinary:  Negative for difficulty urinating and dysuria.  Musculoskeletal:  Negative for joint swelling and myalgias.  Skin:  Negative for color change and rash.  Neurological:  Negative for dizziness, light-headedness and headaches.  Psychiatric/Behavioral:  Negative for agitation and dysphoric mood.       Objective:     BP 122/70    Pulse (!) 55    Temp 98 F (36.7 C)    Resp 16    Ht 5' 5"  (1.651 m)    Wt 121 lb (54.9 kg)    SpO2 98%    BMI 20.14 kg/m  Wt Readings from Last 3 Encounters:  08/02/21 121 lb (54.9 kg)  06/14/21 122 lb 3.2 oz (55.4 kg)  04/30/21 124 lb (56.2 kg)    Physical Exam Vitals reviewed.  Constitutional:      General: She is not in acute distress.    Appearance: Normal appearance.  HENT:     Head: Normocephalic and atraumatic.     Right Ear: External ear normal.     Left Ear: External ear normal.  Eyes:     General: No scleral icterus.       Right eye: No discharge.        Left eye: No discharge.     Conjunctiva/sclera: Conjunctivae normal.  Neck:     Thyroid: No thyromegaly.  Cardiovascular:     Rate and Rhythm: Normal rate and regular rhythm.  Pulmonary:     Effort: No respiratory distress.     Breath sounds: Normal breath sounds. No wheezing.  Abdominal:     General: Bowel sounds are normal.     Palpations: Abdomen is soft.     Tenderness: There is no abdominal tenderness.  Musculoskeletal:        General: No swelling or tenderness.     Cervical back: Neck supple. No tenderness.  Lymphadenopathy:     Cervical: No cervical adenopathy.  Skin:    Findings: No erythema or rash.  Neurological:     Mental Status: She is alert.  Psychiatric:        Mood and Affect: Mood normal.        Behavior: Behavior normal.     Outpatient Encounter Medications as of 08/02/2021   Medication Sig   eszopiclone (LUNESTA) 2 MG TABS tablet Take 1 tablet (2 mg total) by mouth at bedtime as needed for sleep. Take immediately before bedtime   aspirin 81 MG chewable tablet Chew 81  mg by mouth daily.   blood glucose meter kit and supplies KIT Dispense based on patient and insurance preference. Use up to four times daily as directed.   glucose blood (FREESTYLE LITE) test strip Use to check blood sugars three times daily. E11.65   Insulin Pen Needle (PEN NEEDLES) 33G X 4 MM MISC Use to inject Ozempic   losartan (COZAAR) 25 MG tablet Take 1 tablet (25 mg total) by mouth daily.   metFORMIN (GLUCOPHAGE-XR) 500 MG 24 hr tablet Take 1 tablet (500 mg total) by mouth daily.   metoprolol succinate (TOPROL-XL) 25 MG 24 hr tablet TAKE 1 TABLET IN THE MORNING AND ONE-HALF TABLET (12.5 MG) AT BEDTIME   rosuvastatin (CRESTOR) 20 MG tablet TAKE 1 TABLET DAILY   Semaglutide,0.25 or 0.5MG/DOS, (OZEMPIC, 0.25 OR 0.5 MG/DOSE,) 2 MG/1.5ML SOPN Inject 0.5 mg into the skin once a week.   SYNTHROID 50 MCG tablet TAKE 1 TABLET DAILY   [DISCONTINUED] amLODipine (NORVASC) 2.5 MG tablet Take 1 tablet (2.5 mg total) by mouth daily. (Patient not taking: Reported on 08/02/2021)   [DISCONTINUED] eszopiclone (LUNESTA) 1 MG TABS tablet TAKE ONE TABLET BY MOUTH AT BEDTIME AS NEEDED FOR SLEEP (IMMEDIATELY BEFORE BEDTIME)   [DISCONTINUED] Semaglutide,0.25 or 0.5MG/DOS, (OZEMPIC, 0.25 OR 0.5 MG/DOSE,) 2 MG/1.5ML SOPN Inject 0.5 mg into the skin once a week.   No facility-administered encounter medications on file as of 08/02/2021.     Lab Results  Component Value Date   WBC 4.8 02/20/2021   HGB 12.4 02/20/2021   HCT 36.6 02/20/2021   PLT 248.0 02/20/2021   GLUCOSE 108 (H) 06/26/2021   CHOL 134 06/26/2021   TRIG 56.0 06/26/2021   HDL 82.20 06/26/2021   LDLCALC 41 06/26/2021   ALT 20 06/26/2021   AST 26 06/26/2021   NA 136 06/26/2021   K 4.2 06/26/2021   CL 101 06/26/2021   CREATININE 0.92 06/26/2021   BUN  10 06/26/2021   CO2 28 06/26/2021   TSH 0.63 02/20/2021   HGBA1C 6.4 06/26/2021   MICROALBUR <0.7 11/08/2020    MM CLIP PLACEMENT LEFT  Result Date: 01/16/2021 CLINICAL DATA:  Status post stereotactic biopsy EXAM: DIAGNOSTIC LEFT MAMMOGRAM POST STEREOTACTIC BIOPSY COMPARISON:  Previous exam(s). FINDINGS: Mammographic images were obtained following stereotactic guided biopsy of a LEFT breast asymmetry with possible associated distortion. The RIBBON biopsy marking clip is along the medial aspect of the asymmetry. IMPRESSION: Appropriate positioning of the RIBBON shaped biopsy marking clip at the site of biopsy in the LEFT upper outer breast. Final Assessment: Post Procedure Mammograms for Marker Placement Electronically Signed   By: Valentino Saxon MD   On: 01/16/2021 10:53  MM LT BREAST BX W LOC DEV 1ST LESION IMAGE BX SPEC STEREO GUIDE  Addendum Date: 01/17/2021   ADDENDUM REPORT: 01/17/2021 11:53 ADDENDUM: PATHOLOGY revealed: A. BREAST ASYMMETRY, LEFT UPPER OUTER; STEREOTACTIC BIOPSY: - PREDOMINANTLY BENIGN ADIPOSE TISSUE. - AREAS OF STROMAL FIBROSIS AND MAMMARY GLANDS WITH ATROPHIC AND APOCRINE CHANGES. - NEGATIVE FOR ATYPIA AND MALIGNANCY. Pathology results are CONCORDANT with imaging findings, per Dr. Valentino Saxon. 12 stereotactic cores were obtained at time of biopsy. Pathology results and recommendations were discussed with patient via telephone on 01/17/2021. Patient reported doing well after the biopsy with no adverse symptoms, and only slight tenderness at the site. Post biopsy care instructions were reviewed, questions were answered and my direct phone number was provided. Patient was instructed to call [facility] for any additional questions or concerns related to biopsy site.  Recommendation: Patient instructed to continue monthly self breast examinations, clinical follow-up with provider with any subsequent concerns, and return for annual bilateral screening mammogram next  year due June 2023. Pathology results reported by Electa Sniff RN on 01/17/2021. Electronically Signed   By: Valentino Saxon MD   On: 01/17/2021 11:53   Result Date: 01/17/2021 CLINICAL DATA:  Possible asymmetry with questionable architectural distortion EXAM: LEFT BREAST STEREOTACTIC CORE NEEDLE BIOPSY COMPARISON:  Previous exams. FINDINGS: The patient and I discussed the procedure of stereotactic-guided biopsy including benefits and alternatives. We discussed the high likelihood of a successful procedure. We discussed the risks of the procedure including infection, bleeding, tissue injury, clip migration, and inadequate sampling. Informed written consent was given. The usual time out protocol was performed immediately prior to the procedure. Using sterile technique and 1% lidocaine and 1% lidocaine with epinephrine as local anesthetic, under stereotactic guidance, a 9 gauge vacuum assisted device was used to perform core needle biopsy of an asymmetry in the LEFT upper outer quadrant using a LMO approach. Specimen radiograph was performed showing representative tissue. Lesion quadrant: Upper outer quadrant At the conclusion of the procedure, a RIBBON tissue marker clip was deployed into the biopsy cavity. Follow-up 2-view mammogram was performed and dictated separately. IMPRESSION: Stereotactic-guided biopsy of a LEFT breast asymmetry. No apparent complications. Electronically Signed: By: Valentino Saxon MD On: 01/16/2021 11:07      Assessment & Plan:   Problem List Items Addressed This Visit     Difficulty sleeping    Not sleeping.  Will increase lunesta to 60m q day.  Follow.  She desires no further intervention at this time.  Follow.       Dysuria    Noticed some discomfort before urination. Check urine to confirm no infection.       Relevant Orders   Urinalysis, Routine w reflex microscopic (Completed)   Urine Culture (Completed)   Hypercholesterolemia    Continue crestor.  Follow lipid  panel and liver function tests.        Relevant Medications   losartan (COZAAR) 25 MG tablet   Other Relevant Orders   Hepatic function panel   Lipid panel   Hypertension, essential    Back on losartan.  Blood pressure as outlined.  Follow pressures.  Follow metabolic panel.       Relevant Medications   losartan (COZAAR) 25 MG tablet   Hypothyroidism    On thyroid replacement.  Follow tsh.       Raynaud's disease    Has a history of raynaud's.  Off amlodipine due to intolerance.  Stable.         Relevant Medications   losartan (COZAAR) 25 MG tablet   Shoulder pain    Increased shoulder pain as outlined.  Has seen ortho.  Unable to have surgery at this time - due to family obligations.  Follow.       Stress    Increased stress.  Discussed.  Not sleeping.  Will increase lunesta to 25mq day.  Follow.  She desires no further intervention at this time.  Follow.       Type 2 diabetes mellitus with hyperglycemia (HCC)    On metformin and ozempic.  Tolerating.  Sugars as outlined.  Discussed the need to eat regular meals.  Does not need to lose weight.  Follow sugars. Monitor for GI side effects.  Follow met b and a1c.       Relevant Medications   Semaglutide,0.25 or  0.5MG/DOS, (OZEMPIC, 0.25 OR 0.5 MG/DOSE,) 2 MG/1.5ML SOPN   losartan (COZAAR) 25 MG tablet   Other Relevant Orders   Hemoglobin X1E   Basic metabolic panel     Einar Pheasant, MD

## 2021-08-03 LAB — URINE CULTURE
MICRO NUMBER:: 12837768
Result:: NO GROWTH
SPECIMEN QUALITY:: ADEQUATE

## 2021-08-04 ENCOUNTER — Telehealth: Payer: Self-pay | Admitting: Internal Medicine

## 2021-08-04 ENCOUNTER — Encounter: Payer: Self-pay | Admitting: Internal Medicine

## 2021-08-04 NOTE — Assessment & Plan Note (Signed)
Back on losartan.  Blood pressure as outlined.  Follow pressures.  Follow metabolic panel.

## 2021-08-04 NOTE — Assessment & Plan Note (Signed)
Increased shoulder pain as outlined.  Has seen ortho.  Unable to have surgery at this time - due to family obligations.  Follow.

## 2021-08-04 NOTE — Assessment & Plan Note (Signed)
Has a history of raynaud's.  Off amlodipine due to intolerance.  Stable.

## 2021-08-04 NOTE — Assessment & Plan Note (Signed)
On metformin and ozempic.  Tolerating.  Sugars as outlined.  Discussed the need to eat regular meals.  Does not need to lose weight.  Follow sugars. Monitor for GI side effects.  Follow met b and a1c.

## 2021-08-04 NOTE — Assessment & Plan Note (Signed)
Noticed some discomfort before urination. Check urine to confirm no infection.

## 2021-08-04 NOTE — Assessment & Plan Note (Signed)
Not sleeping.  Will increase lunesta to 2mg  q day.  Follow.  She desires no further intervention at this time.  Follow.

## 2021-08-04 NOTE — Assessment & Plan Note (Signed)
Increased stress.  Discussed.  Not sleeping.  Will increase lunesta to 2mg  q day.  Follow.  She desires no further intervention at this time.  Follow.

## 2021-08-04 NOTE — Telephone Encounter (Signed)
Please change her upcoming lab appt.  She had her last labs 06/26/21.  09/24/21 is too soon.  Insurance will not cover if too soon.  Please schedule fasting labs around mid March.  Thanks

## 2021-08-04 NOTE — Assessment & Plan Note (Signed)
On thyroid replacement.  Follow tsh.  

## 2021-08-04 NOTE — Assessment & Plan Note (Signed)
Continue crestor.  Follow lipid panel and liver function tests.  

## 2021-08-08 IMAGING — MG DIGITAL SCREENING BILAT W/ TOMO
6 of 10 series · 6 of 30 positions shown · non-contrast
Comparison: Previous exam(s).

CLINICAL DATA: Screening.

EXAM:
DIGITAL SCREENING BILATERAL MAMMOGRAM WITH TOMO AND CAD

[R CC synth-2D]
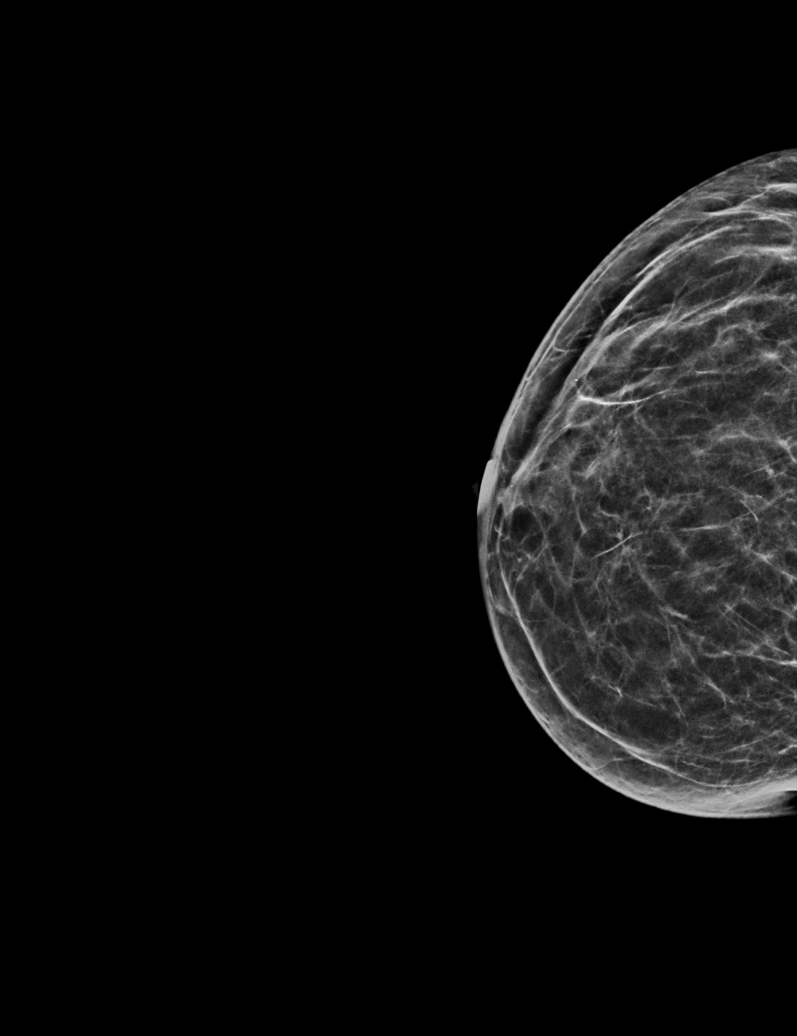

[L MLO synth-2D (1 of 2)]
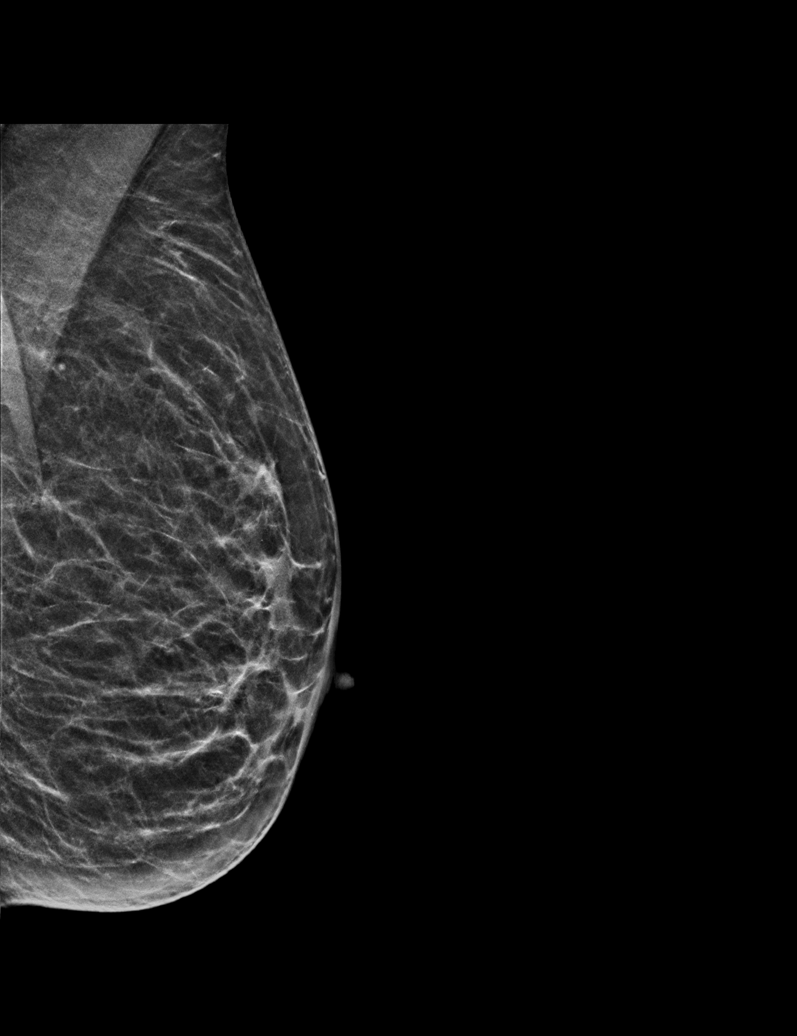

[R MLO synth-2D]
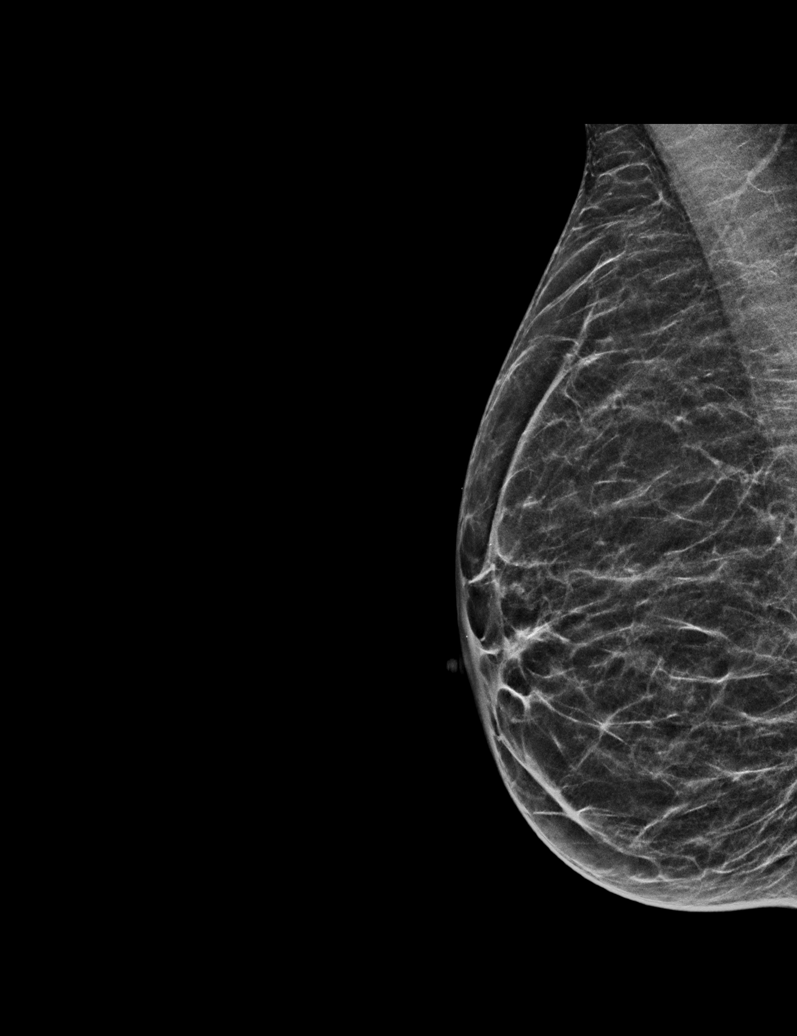

[L MLO synth-2D (2 of 2)]
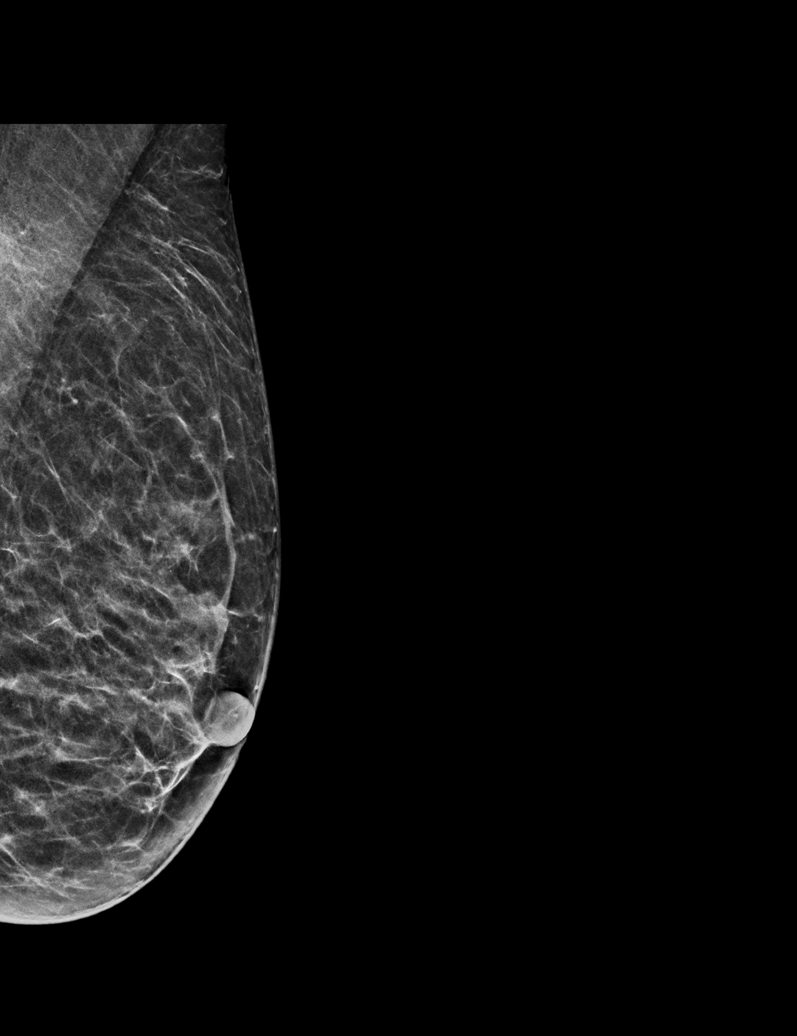

[L CC synth-2D]
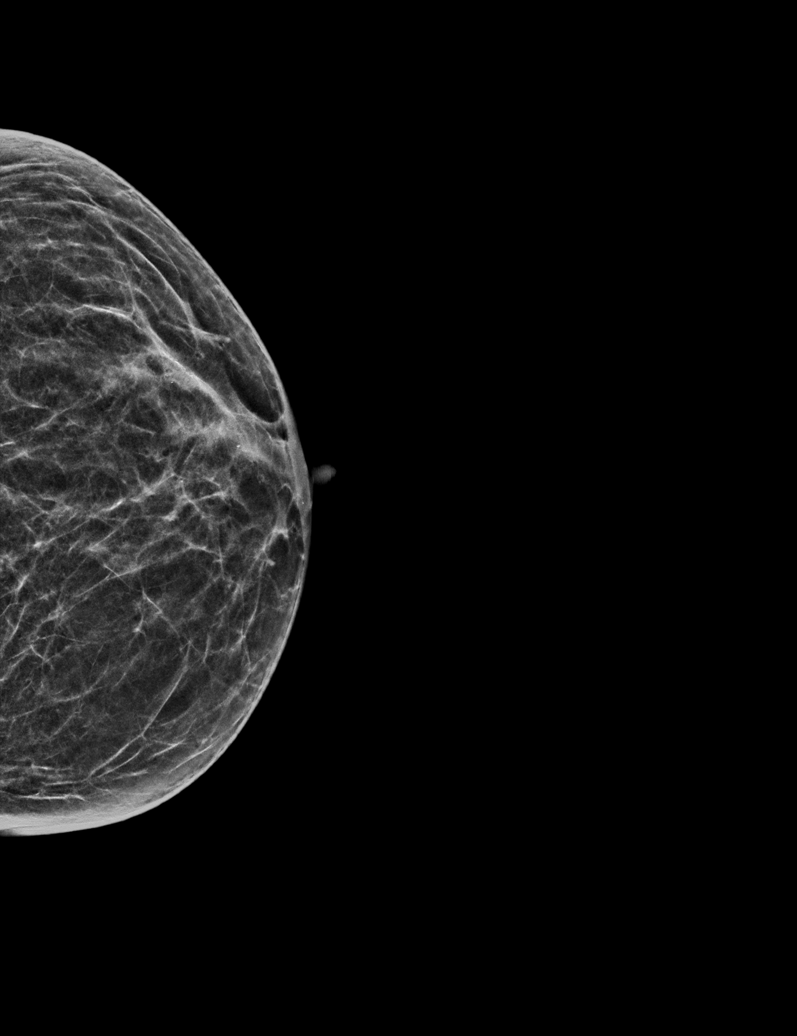

[R CC tomo · tomo slice 23/46.0]
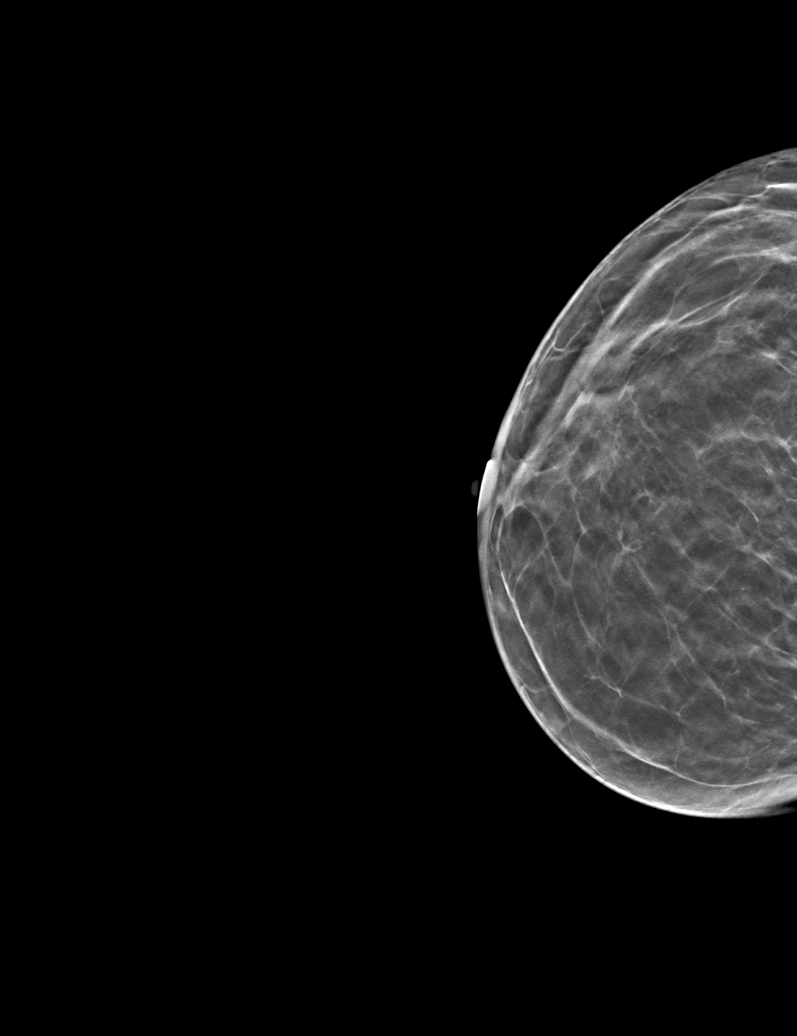

[6 of 30 positions shown; findings below may reference images not displayed]

ACR Breast Density Category c: The breast tissue is heterogeneously
dense, which may obscure small masses.
FINDINGS: There are no findings suspicious for malignancy. Images were
processed with CAD.
IMPRESSION: No mammographic evidence of malignancy. A result letter of this
screening mammogram will be mailed directly to the patient.

RECOMMENDATION:
Screening mammogram in one year. (Code:[id number])

BI-RADS CATEGORY  1: Negative.

## 2021-08-12 DIAGNOSIS — M7581 Other shoulder lesions, right shoulder: Secondary | ICD-10-CM | POA: Diagnosis not present

## 2021-08-12 DIAGNOSIS — S46011A Strain of muscle(s) and tendon(s) of the rotator cuff of right shoulder, initial encounter: Secondary | ICD-10-CM | POA: Insufficient documentation

## 2021-08-12 DIAGNOSIS — S46011D Strain of muscle(s) and tendon(s) of the rotator cuff of right shoulder, subsequent encounter: Secondary | ICD-10-CM | POA: Diagnosis not present

## 2021-09-16 NOTE — Progress Notes (Signed)
Cardiology Office Note  Date:  09/17/2021   ID:  [name], DOB [birth year], MRN [id number]  PCP:  Einar Pheasant, MD   Chief Complaint  Patient presents with   New Patient (Initial Visit)    Establish cardiac care; former Dr. Ubaldo Glassing patient. Medications reviewed by the patient verbally.     HPI:  Ms. Donna Howell is a 71 year old woman with past medical history of Diabetes Moderate tricuspid valve regurgitation, Hyperlipidemia Hypertension Mild LVH, diastolic dysfunction Previously followed at [facility] Who presents to establish care and brought to my office, follow-up for tricuspid valve regurgitation, hypertension, LVH, diastolic dysfunction  On discussion today, she reports that she is doing very well Follows daily aggressive exercise regiment No symptoms of shortness of breath or chest pain on exertion Non-smoker  Significant stress at home, mother hard or hearing, dementia Special needs son  Non-smoker, nondiabetic, cholesterol well controlled  EKG personally reviewed by myself on todays visit Normal sinus rhythm rate 68 bpm PVC no significant ST-T wave changes  Imaging reviewed Echocardiogram May 2022 NORMAL LEFT VENTRICULAR SYSTOLIC FUNCTION  NORMAL RIGHT VENTRICULAR SYSTOLIC FUNCTION  MILD VALVULAR REGURGITATION (See above)  NO VALVULAR STENOSIS  MILD MR, TR  EF 55-60%  Closest EF: >55% (Estimated)  Mitral: MILD MR  Tricuspid: MILD TR     PMH:   has a past medical history of Arthritis, Chicken pox, Diabetes ([place]), Frequent headaches, GERD (gastroesophageal reflux disease), Hay fever, Heart murmur, High blood pressure, History of bronchitis, and Hypothyroidism.  PSH:    Past Surgical History:  Procedure Laterality Date   APPENDECTOMY  1974   BREAST BIOPSY Left 01/16/2021   Affirm bx-"Ribbon" clip-path pending   TONSILLECTOMY AND ADENOIDECTOMY  1962    Current Outpatient Medications  Medication Sig Dispense Refill   aspirin 81 MG chewable  tablet Chew 81 mg by mouth daily.     blood glucose meter kit and supplies KIT Dispense based on patient and insurance preference. Use up to four times daily as directed. 1 each 3   eszopiclone (LUNESTA) 2 MG TABS tablet Take 1 tablet (2 mg total) by mouth at bedtime as needed for sleep. Take immediately before bedtime 30 tablet 2   glucose blood (FREESTYLE LITE) test strip Use to check blood sugars three times daily. E11.65 100 strip 3   Insulin Pen Needle (PEN NEEDLES) 33G X 4 MM MISC Use to inject Ozempic 100 each 3   losartan (COZAAR) 25 MG tablet Take 1 tablet (25 mg total) by mouth daily. 90 tablet 1   metFORMIN (GLUCOPHAGE-XR) 500 MG 24 hr tablet Take 1 tablet (500 mg total) by mouth daily. 90 tablet 1   metoprolol succinate (TOPROL-XL) 25 MG 24 hr tablet TAKE 1 TABLET IN THE MORNING AND ONE-HALF TABLET (12.5 MG) AT BEDTIME 135 tablet 3   rosuvastatin (CRESTOR) 20 MG tablet TAKE 1 TABLET DAILY 90 tablet 2   Semaglutide,0.25 or 0.5MG/DOS, (OZEMPIC, 0.25 OR 0.5 MG/DOSE,) 2 MG/1.5ML SOPN Inject 0.5 mg into the skin once a week. 4.5 mL 1   SYNTHROID 50 MCG tablet TAKE 1 TABLET DAILY 90 tablet 2   No current facility-administered medications for this visit.    Allergies:   Contrast media [iodinated contrast media], Buspirone hcl, Penicillins, Percocet [oxycodone-acetaminophen], and Percodan [oxycodone-aspirin]   Social History:  The patient  reports that she has never smoked. She has never used smokeless tobacco. She reports that she does not drink alcohol and does not use drugs.   Family History:  family history includes Alcoholism in her brother; Arthritis in her maternal grandmother, mother, and sister; Breast cancer (age of onset: 27) in her sister; Diabetes in her maternal grandmother and mother; Heart disease in her maternal grandmother and mother; Stroke in her father.    Review of Systems: Review of Systems  Constitutional: Negative.   HENT: Negative.    Respiratory: Negative.     Cardiovascular: Negative.   Gastrointestinal: Negative.   Musculoskeletal: Negative.   Neurological: Negative.   Psychiatric/Behavioral: Negative.    All other systems reviewed and are negative.   PHYSICAL EXAM: VS:  BP 116/68 (BP Location: Right Arm, Patient Position: Sitting, Cuff Size: Normal)    Pulse 67    Ht 5' 4"  (1.626 m)    Wt 121 lb (54.9 kg)    SpO2 98%    BMI 20.77 kg/m  , BMI Body mass index is 20.77 kg/m. Constitutional:  oriented to person, place, and time. No distress.  HENT:  Head: Grossly normal Eyes:  no discharge. No scleral icterus.  Neck: No JVD, no carotid bruits  Cardiovascular: Regular rate and rhythm, no murmurs appreciated Pulmonary/Chest: Clear to auscultation bilaterally, no wheezes or rails Abdominal: Soft.  no distension.  no tenderness.  Musculoskeletal: Normal range of motion Neurological:  normal muscle tone. Coordination normal. No atrophy Skin: Skin warm and dry Psychiatric: normal affect, pleasant  Recent Labs: 02/20/2021: Hemoglobin 12.4; Platelets 248.0; TSH 0.63 06/26/2021: ALT 20; BUN 10; Creatinine, Ser 0.92; Potassium 4.2; Sodium 136    Lipid Panel Lab Results  Component Value Date   CHOL 134 06/26/2021   HDL 82.20 06/26/2021   LDLCALC 41 06/26/2021   TRIG 56.0 06/26/2021      Wt Readings from Last 3 Encounters:  09/17/21 121 lb (54.9 kg)  08/02/21 121 lb (54.9 kg)  06/14/21 122 lb 3.2 oz (55.4 kg)       ASSESSMENT AND PLAN:  Problem List Items Addressed This Visit       Cardiology Problems   Hypertension, essential     Other   Type 2 diabetes mellitus with hyperglycemia ([place]) - Primary   Other Visit Diagnoses     Tricuspid valve insufficiency, unspecified etiology       Mixed hyperlipidemia          Tricuspid valve regurgitation Noted to be moderate on echocardiogram at outside facility No significant symptoms, no significant murmur on exam, no further work-up at this time Given lack of murmur, suspect  likely less than moderate TR  Diabetes type 2 A1c was 6.4, consistent Management primary care  Hyperlipidemia Cholesterol is at goal on the current lipid regimen. No changes to the medications were made.  PVCs Asymptomatic, discussed finding on EKG no further work-up needed unless symptomatic  Stress Disabled son, mother with dementia and hard of hearing Difficult situation   Total encounter time more than 60 minutes  Greater than 50% was spent in counseling and coordination of care with the patient    Signed, Esmond Plants, M.D., Ph.D. [place], [place]

## 2021-09-17 ENCOUNTER — Encounter: Payer: Self-pay | Admitting: Cardiovascular Disease

## 2021-09-17 ENCOUNTER — Ambulatory Visit (INDEPENDENT_AMBULATORY_CARE_PROVIDER_SITE_OTHER): Payer: Medicare Other | Admitting: Cardiovascular Disease

## 2021-09-17 ENCOUNTER — Other Ambulatory Visit: Payer: Self-pay

## 2021-09-17 VITALS — BP 116/68 | HR 67 | Ht 64.0 in | Wt 121.0 lb

## 2021-09-17 DIAGNOSIS — I10 Essential (primary) hypertension: Secondary | ICD-10-CM

## 2021-09-17 DIAGNOSIS — I071 Rheumatic tricuspid insufficiency: Secondary | ICD-10-CM | POA: Diagnosis not present

## 2021-09-17 DIAGNOSIS — E1165 Type 2 diabetes mellitus with hyperglycemia: Secondary | ICD-10-CM | POA: Diagnosis not present

## 2021-09-17 DIAGNOSIS — E782 Mixed hyperlipidemia: Secondary | ICD-10-CM | POA: Diagnosis not present

## 2021-09-17 NOTE — Patient Instructions (Signed)
Medication Instructions:  No changes  If you need a refill on your cardiac medications before your next appointment, please call your pharmacy.   Lab work: No new labs needed  Testing/Procedures: No new testing needed  Follow-Up: At [facility], you and your health needs are our priority.  As part of our continuing mission to provide you with exceptional heart care, we have created designated Provider Care Teams.  These Care Teams include your primary Cardiologist (physician) and Advanced Practice Providers (APPs -  Physician Assistants and Nurse Practitioners) who all work together to provide you with the care you need, when you need it.  You will need a follow up appointment in 12 months  Providers on your designated Care Team:   Christopher Berge, NP Ryan Dunn, PA-C Cadence Furth, PA-C  COVID-19 Vaccine Information can be found at: [email or web address] For questions related to vaccine distribution or appointments, please email [email or web address] or call [phone].   

## 2021-09-24 ENCOUNTER — Other Ambulatory Visit: Payer: Medicare Other

## 2021-09-30 ENCOUNTER — Encounter: Payer: Self-pay | Admitting: Internal Medicine

## 2021-09-30 ENCOUNTER — Other Ambulatory Visit (INDEPENDENT_AMBULATORY_CARE_PROVIDER_SITE_OTHER): Payer: Medicare Other

## 2021-09-30 ENCOUNTER — Other Ambulatory Visit: Payer: Self-pay

## 2021-09-30 DIAGNOSIS — E7800 Pure hypercholesterolemia, unspecified: Secondary | ICD-10-CM | POA: Diagnosis not present

## 2021-09-30 DIAGNOSIS — E1165 Type 2 diabetes mellitus with hyperglycemia: Secondary | ICD-10-CM

## 2021-09-30 LAB — BASIC METABOLIC PANEL
BUN: 9 mg/dL (ref 6–23)
CO2: 27 mEq/L (ref 19–32)
Calcium: 9.8 mg/dL (ref 8.4–10.5)
Chloride: 98 mEq/L (ref 96–112)
Creatinine, Ser: 0.94 mg/dL (ref 0.40–1.20)
GFR: 61.6 mL/min (ref >60.00)
Glucose, Bld: 127 mg/dL — ABNORMAL HIGH (ref 70–99)
Potassium: 4.1 mEq/L (ref 3.5–5.1)
Sodium: 135 mEq/L (ref 135–145)

## 2021-09-30 LAB — HEPATIC FUNCTION PANEL
ALT: 18 U/L (ref 0–35)
AST: 25 U/L (ref 0–37)
Albumin: 4.8 g/dL (ref 3.5–5.2)
Alkaline Phosphatase: 65 U/L (ref 39–117)
Bilirubin, Direct: 0.1 mg/dL (ref 0.0–0.3)
Total Bilirubin: 0.6 mg/dL (ref 0.2–1.2)
Total Protein: 7.4 g/dL (ref 6.0–8.3)

## 2021-09-30 LAB — LIPID PANEL
Cholesterol: 142 mg/dL (ref 0–200)
HDL: 83.7 mg/dL (ref >39.00)
LDL Cholesterol: 43 mg/dL (ref 0–99)
NonHDL: 58.4
Total CHOL/HDL Ratio: 2
Triglycerides: 76 mg/dL (ref 0.0–149.0)
VLDL: 15.2 mg/dL (ref 0.0–40.0)

## 2021-09-30 LAB — HEMOGLOBIN A1C: Hgb A1c MFr Bld: 6.4 % (ref 4.6–6.5)

## 2021-10-01 NOTE — Telephone Encounter (Signed)
Since her a1c is 6.4 and given her sugars are running low at times, I would hold on increasing metformin.   ?

## 2021-10-02 ENCOUNTER — Ambulatory Visit: Payer: Self-pay | Admitting: Pharmacist

## 2021-10-02 NOTE — Chronic Care Management (AMB) (Signed)
?  Chronic Care Management  ? ?Note ? ?10/02/2021 ?Name: Donna Howell MRN: [id number] DOB: 10-25-1950 ? ? ? ?Closing pharmacy CCM case at this time. . Patient has clinic contact information for future questions or concerns.  ? ?Catie Feliz Beam, PharmD, [place], CPP ?Clinical Pharmacist ?Nature conservation officer at [employer] ?[phone] ? ?

## 2021-10-31 ENCOUNTER — Other Ambulatory Visit: Payer: Self-pay | Admitting: Internal Medicine

## 2021-11-04 ENCOUNTER — Ambulatory Visit: Payer: Medicare Other | Admitting: Internal Medicine

## 2021-11-06 ENCOUNTER — Encounter: Payer: Self-pay | Admitting: Internal Medicine

## 2021-11-06 ENCOUNTER — Ambulatory Visit (INDEPENDENT_AMBULATORY_CARE_PROVIDER_SITE_OTHER): Payer: Medicare Other | Admitting: Internal Medicine

## 2021-11-06 VITALS — BP 116/68 | HR 62 | Temp 98.3°F | Resp 16 | Ht 64.0 in | Wt 117.0 lb

## 2021-11-06 DIAGNOSIS — I10 Essential (primary) hypertension: Secondary | ICD-10-CM

## 2021-11-06 DIAGNOSIS — E1165 Type 2 diabetes mellitus with hyperglycemia: Secondary | ICD-10-CM

## 2021-11-06 DIAGNOSIS — F439 Reaction to severe stress, unspecified: Secondary | ICD-10-CM

## 2021-11-06 DIAGNOSIS — E039 Hypothyroidism, unspecified: Secondary | ICD-10-CM | POA: Diagnosis not present

## 2021-11-06 DIAGNOSIS — E119 Type 2 diabetes mellitus without complications: Secondary | ICD-10-CM

## 2021-11-06 DIAGNOSIS — E7800 Pure hypercholesterolemia, unspecified: Secondary | ICD-10-CM

## 2021-11-06 DIAGNOSIS — G479 Sleep disorder, unspecified: Secondary | ICD-10-CM

## 2021-11-06 DIAGNOSIS — R928 Other abnormal and inconclusive findings on diagnostic imaging of breast: Secondary | ICD-10-CM

## 2021-11-06 MED ORDER — EMPAGLIFLOZIN 10 MG PO TABS: 10.0000 mg | ORAL_TABLET | Freq: Every day | ORAL | 1 refills | Status: DC

## 2021-11-06 MED ORDER — FREESTYLE LITE TEST VI STRP: ORAL_STRIP | 3 refills | Status: DC

## 2021-11-06 NOTE — Progress Notes (Signed)
Patient ID: Donna Howell, female   DOB: 1951-03-12, 71 y.o.   MRN: [id number] ? ? ?Subjective:  ? ? Patient ID: Donna Howell, female    DOB: 09-14-1950, 70 y.o.   MRN: [id number] ? ?This visit occurred during the SARS-CoV-2 public health emergency.  Safety protocols were in place, including screening questions prior to the visit, additional usage of staff PPE, and extensive cleaning of exam room while observing appropriate contact time as indicated for disinfecting solutions.  ? ?Patient here for a scheduled follow up.  ? ?Chief Complaint  ?Patient presents with  ? Follow-up  ?  90mofollow up - diabetes mellitus  ? .  ? ?HPI ?Persistent increased stress.  Increased stress with caring for her mother, her husband's health issues and her son's health issues.  Discussed.  Does not feel she needs any further intervention at this time.  Stays active.  Exercises.  No chest pain with increased exercise.  Some msk pain with certain movements.  Saw Dr GRockey Situ2/21/23.  Felt stable.  Recommended f/u in 12 months.  No increased cough or congestion.  Eating.  Discussed labs and a1c - last 6.4.  she does watch her diet.  Currently on ozempic.  Is losing weight.  Will notice some occasional GI symptoms.  Discussed trial of a different diabetic medication.  Discussed starting jardiance and stopping ozempic.  Had symptoms with increasing metformin.  ? ? ?Past Medical History:  ?Diagnosis Date  ? Arthritis   ? Chicken pox   ? [place]   ? Frequent headaches   ? GERD (gastroesophageal reflux disease)   ? Hay fever   ? Heart murmur   ? High blood pressure   ? History of bronchitis   ? Hypothyroidism   ? ?Past Surgical History:  ?Procedure Laterality Date  ? APPENDECTOMY  1974  ? BREAST BIOPSY Left 01/16/2021  ? Affirm bx-"Ribbon" clip-path pending  ? THumble ? ?Family History  ?Problem Relation Age of Onset  ? Arthritis Mother   ? Heart disease Mother   ? Diabetes Mother   ? Stroke Father   ?  Arthritis Sister   ? Breast cancer Sister 54 ? Alcoholism Brother   ? Arthritis Maternal Grandmother   ? Heart disease Maternal Grandmother   ? Diabetes Maternal Grandmother   ? ?Social History  ? ?Socioeconomic History  ? Marital status: Married  ?  Spouse name: Not on file  ? Number of children: Not on file  ? Years of education: Not on file  ? Highest education level: Not on file  ?Occupational History  ? Not on file  ?Tobacco Use  ? Smoking status: Never  ? Smokeless tobacco: Never  ?Vaping Use  ? Vaping Use: Never used  ?Substance and Sexual Activity  ? Alcohol use: No  ? Drug use: No  ? Sexual activity: Not on file  ?Other Topics Concern  ? Not on file  ?Social History Narrative  ? Not on file  ? ?Social Determinants of Health  ? ?Financial Resource Strain: Low Risk   ? Difficulty of Paying Living Expenses: Not hard at all  ?Food Insecurity: No Food Insecurity  ? Worried About RCharity fundraiserin the Last Year: Never true  ? Ran Out of Food in the Last Year: Never true  ?Transportation Needs: No Transportation Needs  ? Lack of Transportation (Medical): No  ? Lack of Transportation (Non-Medical): No  ?Physical Activity: Sufficiently Active  ?  Days of Exercise per Week: 7 days  ? Minutes of Exercise per Session: 60 min  ?Stress: No Stress Concern Present  ? Feeling of Stress : Not at all  ?Social Connections: Unknown  ? Frequency of Communication with Friends and Family: Not on file  ? Frequency of Social Gatherings with Friends and Family: Not on file  ? Attends Religious Services: Not on file  ? Active Member of Clubs or Organizations: Not on file  ? Attends Archivist Meetings: Not on file  ? Marital Status: Married  ? ? ? ?Review of Systems  ?Constitutional:  Negative for appetite change.  ?     Has lost weight.   ?HENT:  Negative for congestion and sinus pressure.   ?Respiratory:  Negative for cough, chest tightness and shortness of breath.   ?Cardiovascular:  Negative for chest pain,  palpitations and leg swelling.  ?Gastrointestinal:  Negative for abdominal pain, diarrhea, nausea and vomiting.  ?Genitourinary:  Negative for difficulty urinating and dysuria.  ?Musculoskeletal:  Negative for joint swelling and myalgias.  ?Skin:  Negative for color change and rash.  ?Neurological:  Negative for dizziness, light-headedness and headaches.  ?Psychiatric/Behavioral:  Negative for agitation and dysphoric mood.   ? ?   ?Objective:  ?  ? ?BP 116/68 (BP Location: Left Arm, Patient Position: Sitting, Cuff Size: Small)   Pulse 62   Temp 98.3 ?F (36.8 ?C) (Temporal)   Resp 16   Ht 5' 4" (1.626 m)   Wt 117 lb (53.1 kg)   SpO2 99%   BMI 20.08 kg/m?  ?Wt Readings from Last 3 Encounters:  ?11/06/21 117 lb (53.1 kg)  ?09/17/21 121 lb (54.9 kg)  ?08/02/21 121 lb (54.9 kg)  ? ? ?Physical Exam ?Vitals reviewed.  ?Constitutional:   ?   General: She is not in acute distress. ?   Appearance: Normal appearance.  ?HENT:  ?   Head: Normocephalic and atraumatic.  ?   Right Ear: External ear normal.  ?   Left Ear: External ear normal.  ?Eyes:  ?   General: No scleral icterus.    ?   Right eye: No discharge.     ?   Left eye: No discharge.  ?   Conjunctiva/sclera: Conjunctivae normal.  ?Neck:  ?   Thyroid: No thyromegaly.  ?Cardiovascular:  ?   Rate and Rhythm: Normal rate and regular rhythm.  ?Pulmonary:  ?   Effort: No respiratory distress.  ?   Breath sounds: Normal breath sounds. No wheezing.  ?Abdominal:  ?   General: Bowel sounds are normal.  ?   Palpations: Abdomen is soft.  ?   Tenderness: There is no abdominal tenderness.  ?Musculoskeletal:     ?   General: No swelling or tenderness.  ?   Cervical back: Neck supple. No tenderness.  ?Lymphadenopathy:  ?   Cervical: No cervical adenopathy.  ?Skin: ?   Findings: No erythema or rash.  ?Neurological:  ?   Mental Status: She is alert.  ?Psychiatric:     ?   Mood and Affect: Mood normal.     ?   Behavior: Behavior normal.  ? ? ? ?Outpatient Encounter Medications as  of 11/06/2021  ?Medication Sig  ? aspirin 81 MG chewable tablet Chew 81 mg by mouth daily.  ? blood glucose meter kit and supplies KIT Dispense based on patient and insurance preference. Use up to four times daily as directed.  ? empagliflozin (JARDIANCE) 10 MG TABS tablet  Take 1 tablet (10 mg total) by mouth daily before breakfast.  ? eszopiclone (LUNESTA) 2 MG TABS tablet TAKE ONE TABLET BY MOUTH AT BEDTIME AS NEEDED FOR SLEEP. TAKE IMMEDIATELY BEFORE BEDTIME  ? Insulin Pen Needle (PEN NEEDLES) 33G X 4 MM MISC Use to inject Ozempic  ? losartan (COZAAR) 25 MG tablet Take 1 tablet (25 mg total) by mouth daily.  ? metFORMIN (GLUCOPHAGE-XR) 500 MG 24 hr tablet Take 1 tablet (500 mg total) by mouth daily.  ? metoprolol succinate (TOPROL-XL) 25 MG 24 hr tablet TAKE 1 TABLET IN THE MORNING AND ONE-HALF TABLET (12.5 MG) AT BEDTIME  ? rosuvastatin (CRESTOR) 20 MG tablet TAKE 1 TABLET DAILY  ? SYNTHROID 50 MCG tablet TAKE 1 TABLET DAILY  ? [DISCONTINUED] glucose blood (FREESTYLE LITE) test strip Use to check blood sugars three times daily. E11.65  ? [DISCONTINUED] Semaglutide,0.25 or 0.5MG/DOS, (OZEMPIC, 0.25 OR 0.5 MG/DOSE,) 2 MG/1.5ML SOPN Inject 0.5 mg into the skin once a week.  ? glucose blood (FREESTYLE LITE) test strip Use to check blood sugars three times daily. E11.65  ? ?No facility-administered encounter medications on file as of 11/06/2021.  ?  ? ?Lab Results  ?Component Value Date  ? WBC 4.8 02/20/2021  ? HGB 12.4 02/20/2021  ? HCT 36.6 02/20/2021  ? PLT 248.0 02/20/2021  ? GLUCOSE 127 (H) 09/30/2021  ? CHOL 142 09/30/2021  ? TRIG 76.0 09/30/2021  ? HDL 83.70 09/30/2021  ? LDLCALC 43 09/30/2021  ? ALT 18 09/30/2021  ? AST 25 09/30/2021  ? NA 135 09/30/2021  ? K 4.1 09/30/2021  ? CL 98 09/30/2021  ? CREATININE 0.94 09/30/2021  ? BUN 9 09/30/2021  ? CO2 27 09/30/2021  ? TSH 0.63 02/20/2021  ? HGBA1C 6.4 09/30/2021  ? MICROALBUR <0.7 11/08/2020  ? ? ?MM CLIP PLACEMENT LEFT ? ?Result Date: 01/16/2021 ?CLINICAL DATA:   Status post stereotactic biopsy EXAM: DIAGNOSTIC LEFT MAMMOGRAM POST STEREOTACTIC BIOPSY COMPARISON:  Previous exam(s). FINDINGS: Mammographic images were obtained following stereotactic guided biopsy of a LEFT breast a

## 2021-11-07 DIAGNOSIS — E119 Type 2 diabetes mellitus without complications: Secondary | ICD-10-CM | POA: Diagnosis not present

## 2021-11-07 DIAGNOSIS — H524 Presbyopia: Secondary | ICD-10-CM | POA: Diagnosis not present

## 2021-11-11 ENCOUNTER — Encounter: Payer: Self-pay | Admitting: Internal Medicine

## 2021-11-11 NOTE — Assessment & Plan Note (Signed)
Continues on lunesta.  

## 2021-11-11 NOTE — Assessment & Plan Note (Signed)
On losartan.  Blood pressure as outlined.  Follow pressures.  Follow metabolic panel.   

## 2021-11-11 NOTE — Assessment & Plan Note (Signed)
Increased stress.  Discussed.  She desires no further intervention at this time.  Follow.  

## 2021-11-11 NOTE — Assessment & Plan Note (Signed)
Continue crestor.  Follow lipid panel and liver function tests.  

## 2021-11-11 NOTE — Assessment & Plan Note (Signed)
S/p breast biopsy 12/2020.  Benign.  Surgery recommended f/u annual screening mammogram.  

## 2021-11-11 NOTE — Assessment & Plan Note (Signed)
On metformin and ozempic.  With weight loss and some intermittent GI symptoms, will stop ozempic.  trial of jardiance 19m q day.  Discussed the need to stay hydrated.  Hold if sick. Discussed possible side effects.  Discussed the need to eat regular meals.  Does not need to lose weight.  Follow sugars.  Follow met b and a1c.  ?

## 2021-11-11 NOTE — Assessment & Plan Note (Signed)
On thyroid replacement.  Follow tsh.  

## 2021-11-12 ENCOUNTER — Other Ambulatory Visit: Payer: Self-pay | Admitting: Internal Medicine

## 2021-11-12 DIAGNOSIS — E119 Type 2 diabetes mellitus without complications: Secondary | ICD-10-CM

## 2021-11-14 ENCOUNTER — Encounter: Payer: Self-pay | Admitting: Internal Medicine

## 2021-12-09 ENCOUNTER — Other Ambulatory Visit: Payer: Self-pay | Admitting: Internal Medicine

## 2021-12-09 DIAGNOSIS — E1165 Type 2 diabetes mellitus with hyperglycemia: Secondary | ICD-10-CM

## 2021-12-11 NOTE — Telephone Encounter (Signed)
I spoke with patient & stated that she would back down on Ozempic to the 0.25 mg dose. She did not want to stop all together as of yet. She asked since it is hard to get in to see Dr. Lorin Picket here what should she do if she did ever have UTI or vaginal infection. I explained that anything acute at time Dr. Lorin Picket may be able to work in or she could see any provider within our office/[place]. Pt verbalized understanding of this.  ?

## 2021-12-11 NOTE — Telephone Encounter (Signed)
I would like for her to stop ozempic.  If she is not wanting to stop, I am ok with decreasing dose of ozempic to .25.  hold on increasing metformin, until we see how she responds to decreased dose of ozempic.   ?

## 2021-12-17 ENCOUNTER — Encounter: Payer: Self-pay | Admitting: Internal Medicine

## 2021-12-19 ENCOUNTER — Ambulatory Visit (INDEPENDENT_AMBULATORY_CARE_PROVIDER_SITE_OTHER): Payer: Medicare Other | Admitting: Internal Medicine

## 2021-12-19 ENCOUNTER — Other Ambulatory Visit: Payer: Self-pay | Admitting: Surgery

## 2021-12-19 ENCOUNTER — Encounter
Admission: RE | Admit: 2021-12-19 | Discharge: 2021-12-19 | Disposition: A | Discharge status: Home / Self Care | Payer: Medicare Other | Source: Ambulatory Visit | Attending: Surgery | Admitting: Surgery

## 2021-12-19 ENCOUNTER — Encounter: Payer: Self-pay | Admitting: Internal Medicine

## 2021-12-19 ENCOUNTER — Inpatient Hospital Stay: Admission: RE | Admit: 2021-12-19 | Payer: Medicare Other | Source: Ambulatory Visit

## 2021-12-19 DIAGNOSIS — Z01818 Encounter for other preprocedural examination: Secondary | ICD-10-CM

## 2021-12-19 DIAGNOSIS — E039 Hypothyroidism, unspecified: Secondary | ICD-10-CM | POA: Diagnosis not present

## 2021-12-19 DIAGNOSIS — E7800 Pure hypercholesterolemia, unspecified: Secondary | ICD-10-CM

## 2021-12-19 DIAGNOSIS — E1165 Type 2 diabetes mellitus with hyperglycemia: Secondary | ICD-10-CM | POA: Diagnosis not present

## 2021-12-19 DIAGNOSIS — I10 Essential (primary) hypertension: Secondary | ICD-10-CM | POA: Diagnosis not present

## 2021-12-19 DIAGNOSIS — F439 Reaction to severe stress, unspecified: Secondary | ICD-10-CM

## 2021-12-19 DIAGNOSIS — M25519 Pain in unspecified shoulder: Secondary | ICD-10-CM

## 2021-12-19 HISTORY — DX: Anemia, unspecified: D64.9

## 2021-12-19 HISTORY — DX: Nonrheumatic mitral (valve) insufficiency: I34.0

## 2021-12-19 HISTORY — DX: Other specified diseases of liver: K76.89

## 2021-12-19 HISTORY — DX: Irritable bowel syndrome, unspecified: K58.9

## 2021-12-19 HISTORY — DX: Rheumatic tricuspid insufficiency: I07.1

## 2021-12-19 HISTORY — DX: Family history of other specified conditions: Z84.89

## 2021-12-19 NOTE — Patient Instructions (Addendum)
Your procedure is scheduled on: 12-24-21 Tuesday Report to the Registration Desk on the 1st floor of the [facility].Then proceed to 2nd floor Surgery Desk To find out your arrival time, please call [phone] between 1PM - 3PM on:12-20-21 Friday If your arrival time is 6:00 am, do not arrive prior to that time as the [facility] entrance doors do not open until 6:00 am.  REMEMBER: Instructions that are not followed completely may result in serious medical risk, up to and including death; or upon the discretion of your surgeon and anesthesiologist your surgery may need to be rescheduled.  Do not eat food after midnight the night before surgery.  No gum chewing, lozengers or hard candies.  You may however, drink Water up to 2 hours before you are scheduled to arrive for your surgery. Do not drink anything within 2 hours of your scheduled arrival time.  Type 1 and Type 2 diabetics should only drink water.  In addition, your doctor has ordered for you to drink the provided  Gatorade G2 Drinking this carbohydrate drink up to two hours before surgery helps to reduce insulin resistance and improve patient outcomes. Please complete drinking 2 hours prior to scheduled arrival time.  TAKE THESE MEDICATIONS THE MORNING OF SURGERY WITH A SIP OF WATER: -metoprolol succinate (TOPROL-XL)  -SYNTHROID  Stop your 81 mg Aspirin now (12-19-21)  Stop your metFORMIN (GLUCOPHAGE-XR) 2 days prior to surgery-Last dose on 12-21-21 Saturday  One week prior to surgery: Stop Anti-inflammatories (NSAIDS) such as Advil, Aleve, Ibuprofen, Motrin, Naproxen, Naprosyn and Aspirin based products such as Excedrin, Goodys Powder, BC Powder.You may however, take Tylenol if needed for pain up until the day of surgery. Stop ANY OVER THE COUNTER supplements until after surgery (CO Q10, Vitamin B12, Magnesium, Multivitamin, Fish Oil)-You may continue your Fibercon and Probiotic up until the day prior to surgery  No Alcohol  for 24 hours before or after surgery.  No Smoking including e-cigarettes for 24 hours prior to surgery.  No chewable tobacco products for at least 6 hours prior to surgery.  No nicotine patches on the day of surgery.  Do not use any "recreational" drugs for at least a week prior to your surgery.  Please be advised that the combination of cocaine and anesthesia may have negative outcomes, up to and including death. If you test positive for cocaine, your surgery will be cancelled.  On the morning of surgery brush your teeth with toothpaste and water, you may rinse your mouth with mouthwash if you wish. Do not swallow any toothpaste or mouthwash.  Use CHG Soap as directed on instruction sheet.  Do not wear jewelry, make-up, hairpins, clips or nail polish.  Do not wear lotions, powders, or perfumes.   Do not shave body from the neck down 48 hours prior to surgery just in case you cut yourself which could leave a site for infection.  Also, freshly shaved skin may become irritated if using the CHG soap.  Contact lenses, hearing aids and dentures may not be worn into surgery.  Do not bring valuables to the hospital. [facility] is not responsible for any missing/lost belongings or valuables.   Notify your doctor if there is any change in your medical condition (cold, fever, infection).  Wear comfortable clothing (specific to your surgery type) to the hospital.  After surgery, you can help prevent lung complications by doing breathing exercises.  Take deep breaths and cough every 1-2 hours. Your doctor may order a device  called an Facilities manager to help you take deep breaths. When coughing or sneezing, hold a pillow firmly against your incision with both hands. This is called "splinting." Doing this helps protect your incision. It also decreases belly discomfort.  If you are being admitted to the hospital overnight, leave your suitcase in the car. After surgery it may be brought to  your room.  If you are being discharged the day of surgery, you will not be allowed to drive home. You will need a responsible adult (18 years or older) to drive you home and stay with you that night.   If you are taking public transportation, you will need to have a responsible adult (18 years or older) with you. Please confirm with your physician that it is acceptable to use public transportation.   Please call the Pre-admissions Testing Dept. at [phone] if you have any questions about these instructions.  Surgery Visitation Policy:  Patients undergoing a surgery or procedure may have two family members or support persons with them as long as the person is not COVID-19 positive or experiencing its symptoms.

## 2021-12-19 NOTE — Telephone Encounter (Signed)
Schedule virtual visit to discuss 

## 2021-12-19 NOTE — Progress Notes (Signed)
Patient ID: Donna Howell, female   DOB: Jun 02, 1951, 71 y.o.   MRN: [id number]   Virtual Visit via telephone Note  All issues noted in this document were discussed and addressed.  No physical exam was performed (except for noted visual exam findings with Video Visits).   I connected with Donna Howell by telephone and verified that I am speaking with the correct person using two identifiers. Location patient: home Location provider: work  Persons participating in the telephone visit: patient, provider  The limitations, risks, security and privacy concerns of performing an evaluation and management service by telephone and the availability of in person appointments have been discussed.  It has also been discussed with the patient that there may be a patient responsible charge related to this service. The patient expressed understanding and agreed to proceed.   Reason for visit: work in appt.   HPI: Scheduled to undergo right shoulder arthroscopy 12/24/21 by Dr Donna Howell.  Her shoulder pain has been persistent and is affecting her quality of life.  Unable to do things she needs and wants to do.  She had questions about her diabetic medication prior to surgery.  She is not taking jardiance. On metformin.  Lower dose of ozempic.  Eating more.  No nausea or vomiting.  No chest pain or sob.  No cough or congestion.  She is walking 5.5-8 miles per day.  No chest pain, palpitations or sob with increased activity or exertion.  Still with increased stress.    ROS: See pertinent positives and negatives per HPI.  Past Medical History:  Diagnosis Date   Anemia    Arthritis    Chicken pox    Diabetes (HCC)    Family history of adverse reaction to anesthesia    daughter coded during surgery and son had trouble breathing during surgery   Frequent headaches    GERD (gastroesophageal reflux disease)    Hay fever    Heart murmur    High blood pressure    History of bronchitis    Hypothyroidism    IBS  (irritable bowel syndrome)    Liver cyst    Mitral valve regurgitation    Tricuspid regurgitation     Past Surgical History:  Procedure Laterality Date   APPENDECTOMY  1974   BREAST BIOPSY Left 01/16/2021   Affirm bx-"Ribbon" clip-path pending   CERVICAL CERCLAGE     DIAGNOSTIC LAPAROSCOPY     TONSILLECTOMY AND ADENOIDECTOMY  1962    Family History  Problem Relation Age of Onset   Arthritis Mother    Heart disease Mother    Diabetes Mother    Stroke Father    Arthritis Sister    Breast cancer Sister 24   Alcoholism Brother    Arthritis Maternal Grandmother    Heart disease Maternal Grandmother    Diabetes Maternal Grandmother     SOCIAL HX: reviewed.    Current Outpatient Medications:    aspirin 81 MG chewable tablet, Chew 81 mg by mouth daily., Disp: , Rfl:    blood glucose meter kit and supplies KIT, Dispense based on patient and insurance preference. Use up to four times daily as directed., Disp: 1 each, Rfl: 3   Coenzyme Q10 (CO Q 10 PO), Take 1 tablet by mouth daily at 6 (six) AM., Disp: , Rfl:    Cyanocobalamin (VITAMIN B12 PO), Take 2 tablets by mouth daily at 6 (six) AM. gummies, Disp: , Rfl:    eszopiclone (LUNESTA) 2 MG TABS tablet, TAKE  ONE TABLET BY MOUTH AT BEDTIME AS NEEDED FOR SLEEP. TAKE IMMEDIATELY BEFORE BEDTIME (Patient taking differently: Take 2 mg by mouth at bedtime.), Disp: 30 tablet, Rfl: 2   glucose blood (FREESTYLE LITE) test strip, Use to check blood sugars three times daily. E11.65, Disp: 100 strip, Rfl: 3   Insulin Pen Needle (PEN NEEDLES) 33G X 4 MM MISC, Use to inject Ozempic, Disp: 100 each, Rfl: 3   losartan (COZAAR) 25 MG tablet, Take 1 tablet (25 mg total) by mouth daily. (Patient taking differently: Take 25 mg by mouth every morning.), Disp: 90 tablet, Rfl: 1   MAGNESIUM PO, Take 1 tablet by mouth daily at 6 (six) AM., Disp: , Rfl:    metFORMIN (GLUCOPHAGE-XR) 500 MG 24 hr tablet, TAKE 2 TABLETS TWICE A DAY WITH A MEAL (Patient taking  differently: 500 mg daily with breakfast.), Disp: 360 tablet, Rfl: 3   metoprolol succinate (TOPROL-XL) 25 MG 24 hr tablet, TAKE 1 TABLET IN THE MORNING AND ONE-HALF TABLET (12.5 MG) AT BEDTIME, Disp: 135 tablet, Rfl: 3   Multiple Vitamin (MULTIVITAMIN) tablet, Take 1 tablet by mouth daily., Disp: , Rfl:    Omega-3 Fatty Acids (FISH OIL PO), Take 2 tablets by mouth as needed., Disp: , Rfl:    polycarbophil (FIBERCON) 625 MG tablet, Take 625 mg by mouth daily., Disp: , Rfl:    Probiotic Product (PROBIOTIC DAILY PO), Take 1 tablet by mouth daily at 6 (six) AM., Disp: , Rfl:    rosuvastatin (CRESTOR) 20 MG tablet, TAKE 1 TABLET DAILY (Patient taking differently: Take 20 mg by mouth at bedtime.), Disp: 90 tablet, Rfl: 2   Semaglutide,0.25 or 0.5MG/DOS, (OZEMPIC, 0.25 OR 0.5 MG/DOSE,) 2 MG/1.5ML SOPN, Inject into the skin once a week., Disp: , Rfl:    SYNTHROID 50 MCG tablet, TAKE 1 TABLET DAILY (Patient taking differently: Take 50 mcg by mouth daily before breakfast.), Disp: 90 tablet, Rfl: 2 No current facility-administered medications for this visit.  Facility-Administered Medications Ordered in Other Visits:    0.9 %  sodium chloride infusion, , Intravenous, Continuous, Piscitello, Precious Haws, MD   ceFAZolin (ANCEF) IVPB 2g/100 mL premix, 2 g, Intravenous, On Call to OR, Poggi, Marshall Cork, MD   famotidine (PEPCID) tablet 20 mg, 20 mg, Oral, Once, Donna Kitchens, NP  EXAM:  GENERAL: alert. Sounds to be in no acute distress.  Answering questions appropriately.   PSYCH/NEURO: pleasant and cooperative, no obvious depression or anxiety, speech and thought processing grossly intact  ASSESSMENT AND PLAN:  Discussed the following assessment and plan:  Problem List Items Addressed This Visit     Hypercholesterolemia    Continue crestor.  Follow lipid panel and liver function tests.         Hypertension, essential    On losartan.  Follow pressures.  Follow metabolic panel.        Hypothyroidism     On thyroid replacement.  Follow tsh.        Shoulder pain    Seeing ortho. Planning for shoulder surgery as outlined.  She is very active - walking at least 5.5-8 miles per day.  No chest pain or sob with increased activity or exertion.  Ortho has discussed stopping aspirin and vitamins.  Discussed making no changes in her diabetic medication.  Remain off jardiance.        Stress    Increased stress.  Discussed.  She desires no further intervention at this time.  Follow.  Type 2 diabetes mellitus with hyperglycemia (HCC)    On metformin and low dose ozempic.  Not taking jardiance.  Eating better.  Sugars have been under good control.  Hold on making changes in her medication.  Follow met b and a1c.         Return if symptoms worsen or fail to improve, for keep scheduled. .   I discussed the assessment and treatment plan with the patient. The patient was provided an opportunity to ask questions and all were answered. The patient agreed with the plan and demonstrated an understanding of the instructions.   The patient was advised to call back or seek an in-person evaluation if the symptoms worsen or if the condition fails to improve as anticipated.  I provided 23 minutes of non-face-to-face time during this encounter.   Einar Pheasant, MD

## 2021-12-20 ENCOUNTER — Encounter
Admission: RE | Admit: 2021-12-20 | Discharge: 2021-12-20 | Disposition: A | Discharge status: Home / Self Care | Payer: Medicare Other | Source: Ambulatory Visit | Attending: Surgery | Admitting: Surgery

## 2021-12-20 DIAGNOSIS — S46011D Strain of muscle(s) and tendon(s) of the rotator cuff of right shoulder, subsequent encounter: Secondary | ICD-10-CM | POA: Diagnosis not present

## 2021-12-20 DIAGNOSIS — Z01812 Encounter for preprocedural laboratory examination: Secondary | ICD-10-CM | POA: Diagnosis not present

## 2021-12-20 DIAGNOSIS — M7581 Other shoulder lesions, right shoulder: Secondary | ICD-10-CM | POA: Diagnosis not present

## 2021-12-20 DIAGNOSIS — Z01818 Encounter for other preprocedural examination: Secondary | ICD-10-CM

## 2021-12-20 LAB — CBC
HCT: 38.1 % (ref 36.0–46.0)
Hemoglobin: 12.6 g/dL (ref 12.0–15.0)
MCH: 29.7 pg (ref 26.0–34.0)
MCHC: 33.1 g/dL (ref 30.0–36.0)
MCV: 89.9 fL (ref 80.0–100.0)
Platelets: 290 10*3/uL (ref 150–400)
RBC: 4.24 MIL/uL (ref 3.87–5.11)
RDW: 12.1 % (ref 11.5–15.5)
WBC: 4.5 10*3/uL (ref 4.0–10.5)
nRBC: 0 % (ref 0.0–0.2)

## 2021-12-24 ENCOUNTER — Encounter: Payer: Self-pay | Admitting: Internal Medicine

## 2021-12-24 ENCOUNTER — Ambulatory Visit: Payer: Medicare Other | Admitting: Certified Registered"

## 2021-12-24 ENCOUNTER — Other Ambulatory Visit: Payer: Self-pay

## 2021-12-24 ENCOUNTER — Encounter
Admission: RE | Disposition: A | Discharge status: Home / Self Care | Payer: Self-pay | Source: Home / Self Care | Attending: Surgery

## 2021-12-24 ENCOUNTER — Ambulatory Visit: Payer: Medicare Other

## 2021-12-24 ENCOUNTER — Ambulatory Visit
Admission: RE | Admit: 2021-12-24 | Discharge: 2021-12-24 | Disposition: A | Discharge status: Home / Self Care | Payer: Medicare Other | Attending: Surgery | Admitting: Surgery

## 2021-12-24 ENCOUNTER — Encounter: Payer: Self-pay | Admitting: Surgery

## 2021-12-24 DIAGNOSIS — S46011A Strain of muscle(s) and tendon(s) of the rotator cuff of right shoulder, initial encounter: Secondary | ICD-10-CM | POA: Diagnosis not present

## 2021-12-24 DIAGNOSIS — I081 Rheumatic disorders of both mitral and tricuspid valves: Secondary | ICD-10-CM | POA: Diagnosis not present

## 2021-12-24 DIAGNOSIS — M778 Other enthesopathies, not elsewhere classified: Secondary | ICD-10-CM | POA: Diagnosis not present

## 2021-12-24 DIAGNOSIS — Y93K1 Activity, walking an animal: Secondary | ICD-10-CM | POA: Diagnosis not present

## 2021-12-24 DIAGNOSIS — Z8249 Family history of ischemic heart disease and other diseases of the circulatory system: Secondary | ICD-10-CM | POA: Insufficient documentation

## 2021-12-24 DIAGNOSIS — Z7984 Long term (current) use of oral hypoglycemic drugs: Secondary | ICD-10-CM | POA: Insufficient documentation

## 2021-12-24 DIAGNOSIS — M65811 Other synovitis and tenosynovitis, right shoulder: Secondary | ICD-10-CM | POA: Insufficient documentation

## 2021-12-24 DIAGNOSIS — Z833 Family history of diabetes mellitus: Secondary | ICD-10-CM | POA: Diagnosis not present

## 2021-12-24 DIAGNOSIS — M25811 Other specified joint disorders, right shoulder: Secondary | ICD-10-CM | POA: Diagnosis not present

## 2021-12-24 DIAGNOSIS — S46011D Strain of muscle(s) and tendon(s) of the rotator cuff of right shoulder, subsequent encounter: Secondary | ICD-10-CM | POA: Diagnosis not present

## 2021-12-24 DIAGNOSIS — S46101A Unspecified injury of muscle, fascia and tendon of long head of biceps, right arm, initial encounter: Secondary | ICD-10-CM | POA: Diagnosis not present

## 2021-12-24 DIAGNOSIS — E119 Type 2 diabetes mellitus without complications: Secondary | ICD-10-CM | POA: Diagnosis not present

## 2021-12-24 DIAGNOSIS — I10 Essential (primary) hypertension: Secondary | ICD-10-CM | POA: Insufficient documentation

## 2021-12-24 DIAGNOSIS — Z79899 Other long term (current) drug therapy: Secondary | ICD-10-CM | POA: Diagnosis not present

## 2021-12-24 DIAGNOSIS — Z01818 Encounter for other preprocedural examination: Secondary | ICD-10-CM

## 2021-12-24 DIAGNOSIS — M7541 Impingement syndrome of right shoulder: Secondary | ICD-10-CM | POA: Diagnosis not present

## 2021-12-24 DIAGNOSIS — M25511 Pain in right shoulder: Secondary | ICD-10-CM | POA: Diagnosis not present

## 2021-12-24 DIAGNOSIS — M7531 Calcific tendinitis of right shoulder: Secondary | ICD-10-CM | POA: Diagnosis not present

## 2021-12-24 DIAGNOSIS — G8918 Other acute postprocedural pain: Secondary | ICD-10-CM | POA: Diagnosis not present

## 2021-12-24 DIAGNOSIS — M24111 Other articular cartilage disorders, right shoulder: Secondary | ICD-10-CM | POA: Diagnosis not present

## 2021-12-24 DIAGNOSIS — W010XXA Fall on same level from slipping, tripping and stumbling without subsequent striking against object, initial encounter: Secondary | ICD-10-CM | POA: Insufficient documentation

## 2021-12-24 DIAGNOSIS — M7581 Other shoulder lesions, right shoulder: Secondary | ICD-10-CM | POA: Diagnosis not present

## 2021-12-24 HISTORY — PX: SHOULDER ARTHROSCOPY WITH SUBACROMIAL DECOMPRESSION, ROTATOR CUFF REPAIR AND BICEP TENDON REPAIR: SHX5687

## 2021-12-24 LAB — GLUCOSE, CAPILLARY
Glucose-Capillary: 110 mg/dL — ABNORMAL HIGH (ref 70–99)
Glucose-Capillary: 133 mg/dL — ABNORMAL HIGH (ref 70–99)

## 2021-12-24 SURGERY — SHOULDER ARTHROSCOPY WITH SUBACROMIAL DECOMPRESSION, ROTATOR CUFF REPAIR AND BICEP TENDON REPAIR
Anesthesia: General | Site: Shoulder | Laterality: Right

## 2021-12-24 MED ORDER — ONDANSETRON HCL 4 MG/2ML IJ SOLN
INTRAMUSCULAR | Status: AC
  Filled 2021-12-24: qty 2

## 2021-12-24 MED ORDER — LACTATED RINGERS IV SOLN
INTRAVENOUS | Status: DC | PRN
  Administered 2021-12-24: 5000 mL

## 2021-12-24 MED ORDER — BUPIVACAINE-EPINEPHRINE 0.5% -1:200000 IJ SOLN
INTRAMUSCULAR | Status: DC | PRN
  Administered 2021-12-24: 30 mL

## 2021-12-24 MED ORDER — METFORMIN HCL ER 500 MG PO TB24: 500.0000 mg | ORAL_TABLET | Freq: Every day | ORAL | Status: DC

## 2021-12-24 MED ORDER — ACETAMINOPHEN-CODEINE 300-30 MG PO TABS: 1.0000 | ORAL_TABLET | Freq: Four times a day (QID) | ORAL | 0 refills | Status: DC | PRN

## 2021-12-24 MED ORDER — LIDOCAINE HCL (CARDIAC) PF 100 MG/5ML IV SOSY
PREFILLED_SYRINGE | INTRAVENOUS | Status: DC | PRN
  Administered 2021-12-24: 60 mg via INTRAVENOUS

## 2021-12-24 MED ORDER — LIDOCAINE HCL (PF) 2 % IJ SOLN
INTRAMUSCULAR | Status: AC
  Filled 2021-12-24: qty 5

## 2021-12-24 MED ORDER — PROPOFOL 10 MG/ML IV BOLUS
INTRAVENOUS | Status: DC | PRN
  Administered 2021-12-24: 100 mg via INTRAVENOUS

## 2021-12-24 MED ORDER — ROCURONIUM BROMIDE 10 MG/ML (PF) SYRINGE
PREFILLED_SYRINGE | INTRAVENOUS | Status: AC
  Filled 2021-12-24: qty 10

## 2021-12-24 MED ORDER — ONDANSETRON HCL 4 MG/2ML IJ SOLN
4.0000 mg | Freq: Once | INTRAMUSCULAR | Status: AC | PRN
Start: 2021-12-24 — End: 2021-12-24
  Administered 2021-12-24: 4 mg via INTRAVENOUS

## 2021-12-24 MED ORDER — BUPIVACAINE LIPOSOME 1.3 % IJ SUSP
INTRAMUSCULAR | Status: AC
  Filled 2021-12-24: qty 10

## 2021-12-24 MED ORDER — PHENYLEPHRINE 80 MCG/ML (10ML) SYRINGE FOR IV PUSH (FOR BLOOD PRESSURE SUPPORT)
PREFILLED_SYRINGE | INTRAVENOUS | Status: DC | PRN
  Administered 2021-12-24: 160 ug via INTRAVENOUS
  Administered 2021-12-24: 80 ug via INTRAVENOUS
  Administered 2021-12-24: 160 ug via INTRAVENOUS
  Administered 2021-12-24: 80 ug via INTRAVENOUS

## 2021-12-24 MED ORDER — ESZOPICLONE 2 MG PO TABS: 2.0000 mg | ORAL_TABLET | Freq: Every day | ORAL | Status: DC

## 2021-12-24 MED ORDER — PROPOFOL 10 MG/ML IV BOLUS
INTRAVENOUS | Status: AC
  Filled 2021-12-24: qty 20

## 2021-12-24 MED ORDER — FENTANYL CITRATE (PF) 100 MCG/2ML IJ SOLN
INTRAMUSCULAR | Status: DC | PRN
  Administered 2021-12-24: 25 ug via INTRAVENOUS

## 2021-12-24 MED ORDER — ROCURONIUM BROMIDE 100 MG/10ML IV SOLN
INTRAVENOUS | Status: DC | PRN
  Administered 2021-12-24: 50 mg via INTRAVENOUS

## 2021-12-24 MED ORDER — ROSUVASTATIN CALCIUM 20 MG PO TABS: 20.0000 mg | ORAL_TABLET | Freq: Every evening | ORAL | Status: DC

## 2021-12-24 MED ORDER — DEXAMETHASONE SODIUM PHOSPHATE 10 MG/ML IJ SOLN
INTRAMUSCULAR | Status: DC | PRN
  Administered 2021-12-24: 5 mg via INTRAVENOUS

## 2021-12-24 MED ORDER — MIDAZOLAM HCL 2 MG/2ML IJ SOLN
INTRAMUSCULAR | Status: AC
  Filled 2021-12-24: qty 2

## 2021-12-24 MED ORDER — LEVOTHYROXINE SODIUM 50 MCG PO TABS: 50.0000 ug | ORAL_TABLET | Freq: Every day | ORAL | Status: DC

## 2021-12-24 MED ORDER — BUPIVACAINE HCL (PF) 0.5 % IJ SOLN
INTRAMUSCULAR | Status: AC
  Filled 2021-12-24: qty 10

## 2021-12-24 MED ORDER — FENTANYL CITRATE (PF) 100 MCG/2ML IJ SOLN: 25.0000 ug | INTRAMUSCULAR | Status: DC | PRN

## 2021-12-24 MED ORDER — ONDANSETRON 4 MG PO TBDP: 4.0000 mg | ORAL_TABLET | Freq: Three times a day (TID) | ORAL | 1 refills | Status: DC | PRN

## 2021-12-24 MED ORDER — EPHEDRINE 5 MG/ML INJ
INTRAVENOUS | Status: AC
  Filled 2021-12-24: qty 5

## 2021-12-24 MED ORDER — ORAL CARE MOUTH RINSE: 15.0000 mL | Freq: Once | OROMUCOSAL | Status: AC

## 2021-12-24 MED ORDER — CEFAZOLIN SODIUM-DEXTROSE 2-4 GM/100ML-% IV SOLN
2.0000 g | INTRAVENOUS | Status: AC
  Administered 2021-12-24: 2 g via INTRAVENOUS

## 2021-12-24 MED ORDER — EPINEPHRINE PF 1 MG/ML IJ SOLN
INTRAMUSCULAR | Status: AC
  Filled 2021-12-24: qty 1

## 2021-12-24 MED ORDER — MIDAZOLAM HCL 2 MG/2ML IJ SOLN
INTRAMUSCULAR | Status: AC
  Administered 2021-12-24: 1 mg via INTRAVENOUS
  Filled 2021-12-24: qty 2

## 2021-12-24 MED ORDER — MIDAZOLAM HCL 2 MG/2ML IJ SOLN
1.0000 mg | INTRAMUSCULAR | Status: AC | PRN
  Administered 2021-12-24: 1 mg via INTRAVENOUS

## 2021-12-24 MED ORDER — CEFAZOLIN SODIUM-DEXTROSE 2-4 GM/100ML-% IV SOLN
INTRAVENOUS | Status: AC
  Filled 2021-12-24: qty 100

## 2021-12-24 MED ORDER — SUGAMMADEX SODIUM 200 MG/2ML IV SOLN
INTRAVENOUS | Status: DC | PRN
  Administered 2021-12-24: 200 mg via INTRAVENOUS

## 2021-12-24 MED ORDER — EPHEDRINE SULFATE (PRESSORS) 50 MG/ML IJ SOLN
INTRAMUSCULAR | Status: DC | PRN
  Administered 2021-12-24 (×2): 10 mg via INTRAVENOUS
  Administered 2021-12-24: 5 mg via INTRAVENOUS

## 2021-12-24 MED ORDER — ACETAMINOPHEN 10 MG/ML IV SOLN
INTRAVENOUS | Status: AC
  Filled 2021-12-24: qty 100

## 2021-12-24 MED ORDER — GLYCOPYRROLATE 0.2 MG/ML IJ SOLN
INTRAMUSCULAR | Status: DC | PRN
  Administered 2021-12-24 (×2): .1 mg via INTRAVENOUS

## 2021-12-24 MED ORDER — FENTANYL CITRATE (PF) 100 MCG/2ML IJ SOLN
INTRAMUSCULAR | Status: AC
  Filled 2021-12-24: qty 2

## 2021-12-24 MED ORDER — DEXAMETHASONE SODIUM PHOSPHATE 10 MG/ML IJ SOLN
INTRAMUSCULAR | Status: AC
  Filled 2021-12-24: qty 1

## 2021-12-24 MED ORDER — CHLORHEXIDINE GLUCONATE 0.12 % MT SOLN: 15.0000 mL | Freq: Once | OROMUCOSAL | Status: AC

## 2021-12-24 MED ORDER — ONDANSETRON HCL 4 MG/2ML IJ SOLN
INTRAMUSCULAR | Status: DC | PRN
  Administered 2021-12-24: 4 mg via INTRAVENOUS

## 2021-12-24 MED ORDER — SODIUM CHLORIDE 0.9 % IV SOLN
INTRAVENOUS | Status: DC
Start: 2021-12-24 — End: 2021-12-24

## 2021-12-24 MED ORDER — ACETAMINOPHEN 10 MG/ML IV SOLN
INTRAVENOUS | Status: DC | PRN
  Administered 2021-12-24: 500 mg via INTRAVENOUS

## 2021-12-24 MED ORDER — PHENYLEPHRINE HCL-NACL 20-0.9 MG/250ML-% IV SOLN
INTRAVENOUS | Status: DC | PRN
  Administered 2021-12-24: 30 ug/min via INTRAVENOUS

## 2021-12-24 MED ORDER — FAMOTIDINE 20 MG PO TABS: 20.0000 mg | ORAL_TABLET | Freq: Once | ORAL | Status: AC

## 2021-12-24 MED ORDER — LOSARTAN POTASSIUM 25 MG PO TABS: 25.0000 mg | ORAL_TABLET | ORAL | Status: DC

## 2021-12-24 MED ORDER — FAMOTIDINE 20 MG PO TABS
ORAL_TABLET | ORAL | Status: AC
  Administered 2021-12-24: 20 mg via ORAL
  Filled 2021-12-24: qty 1

## 2021-12-24 MED ORDER — BUPIVACAINE-EPINEPHRINE (PF) 0.5% -1:200000 IJ SOLN
INTRAMUSCULAR | Status: AC
  Filled 2021-12-24: qty 30

## 2021-12-24 MED ORDER — BUPIVACAINE HCL (PF) 0.5 % IJ SOLN
INTRAMUSCULAR | Status: DC | PRN
  Administered 2021-12-24: 10 mL via PERINEURAL

## 2021-12-24 MED ORDER — BUPIVACAINE LIPOSOME 1.3 % IJ SUSP
INTRAMUSCULAR | Status: DC | PRN
  Administered 2021-12-24: 10 mL via PERINEURAL

## 2021-12-24 MED ORDER — EPINEPHRINE PF 1 MG/ML IJ SOLN
INTRAMUSCULAR | Status: AC
  Filled 2021-12-24: qty 2

## 2021-12-24 MED ORDER — CHLORHEXIDINE GLUCONATE 0.12 % MT SOLN
OROMUCOSAL | Status: AC
  Administered 2021-12-24: 15 mL via OROMUCOSAL
  Filled 2021-12-24: qty 15

## 2021-12-24 SURGICAL SUPPLY — 49 items
ANCHOR SUT W/ ORTHOCORD (Anchor) ×1 IMPLANT
BIT DRILL JUGRKNT W/NDL BIT2.9 (DRILL) IMPLANT
BLADE FULL RADIUS 3.5 (BLADE) ×2 IMPLANT
BUR ACROMIONIZER 4.0 (BURR) ×2 IMPLANT
CANNULA SHAVER 8MMX76MM (CANNULA) ×2 IMPLANT
CHLORAPREP W/TINT 26 (MISCELLANEOUS) ×2 IMPLANT
COVER MAYO STAND REUSABLE (DRAPES) ×2 IMPLANT
DILATOR 5.5 THREADED HEALICOIL (MISCELLANEOUS) IMPLANT
DRILL JUGGERKNOT W/NDL BIT 2.9 (DRILL)
ELECT CAUTERY BLADE 6.4 (BLADE) ×2 IMPLANT
ELECT REM PT RETURN 9FT ADLT (ELECTROSURGICAL) ×2
ELECTRODE REM PT RTRN 9FT ADLT (ELECTROSURGICAL) ×1 IMPLANT
GAUZE SPONGE 4X4 12PLY STRL (GAUZE/BANDAGES/DRESSINGS) ×2 IMPLANT
GAUZE XEROFORM 1X8 LF (GAUZE/BANDAGES/DRESSINGS) ×2 IMPLANT
GLOVE BIO SURGEON STRL SZ7.5 (GLOVE) ×4 IMPLANT
GLOVE BIO SURGEON STRL SZ8 (GLOVE) ×4 IMPLANT
GLOVE BIOGEL PI IND STRL 8 (GLOVE) ×1 IMPLANT
GLOVE BIOGEL PI INDICATOR 8 (GLOVE) ×1
GLOVE SURG UNDER LTX SZ8 (GLOVE) ×2 IMPLANT
GOWN STRL REUS W/ TWL LRG LVL3 (GOWN DISPOSABLE) ×1 IMPLANT
GOWN STRL REUS W/ TWL XL LVL3 (GOWN DISPOSABLE) ×1 IMPLANT
GOWN STRL REUS W/TWL LRG LVL3 (GOWN DISPOSABLE) ×1
GOWN STRL REUS W/TWL XL LVL3 (GOWN DISPOSABLE) ×1
GRASPER SUT 15 45D LOW PRO (SUTURE) ×1 IMPLANT
IV LACTATED RINGER IRRG 3000ML (IV SOLUTION) ×2
IV LR IRRIG 3000ML ARTHROMATIC (IV SOLUTION) ×2 IMPLANT
KIT CANNULA 8X76-LX IN CANNULA (CANNULA) ×1 IMPLANT
MANIFOLD NEPTUNE II (INSTRUMENTS) ×4 IMPLANT
MASK FACE SPIDER DISP (MASK) ×2 IMPLANT
MAT ABSORB  FLUID 56X50 GRAY (MISCELLANEOUS) ×1
MAT ABSORB FLUID 56X50 GRAY (MISCELLANEOUS) ×1 IMPLANT
PACK ARTHROSCOPY SHOULDER (MISCELLANEOUS) ×2 IMPLANT
PAD ABD DERMACEA PRESS 5X9 (GAUZE/BANDAGES/DRESSINGS) ×4 IMPLANT
PASSER SUT FIRSTPASS SELF (INSTRUMENTS) IMPLANT
SLING ARM LRG DEEP (SOFTGOODS) ×2 IMPLANT
SLING ULTRA II LG (MISCELLANEOUS) ×2 IMPLANT
SLING ULTRA II M (MISCELLANEOUS) ×1 IMPLANT
SPONGE T-LAP 18X18 ~~LOC~~+RFID (SPONGE) ×2 IMPLANT
STAPLER SKIN PROX 35W (STAPLE) ×2 IMPLANT
STRAP SAFETY 5IN WIDE (MISCELLANEOUS) ×2 IMPLANT
SUT ETHIBOND 0 MO6 C/R (SUTURE) ×2 IMPLANT
SUT ULTRABRAID 2 COBRAID 38 (SUTURE) IMPLANT
SUT VIC AB 2-0 CT1 27 (SUTURE) ×2
SUT VIC AB 2-0 CT1 TAPERPNT 27 (SUTURE) ×2 IMPLANT
TAPE MICROFOAM 4IN (TAPE) ×2 IMPLANT
TUBING CONNECTING 10 (TUBING) ×2 IMPLANT
TUBING INFLOW SET DBFLO PUMP (TUBING) ×2 IMPLANT
WAND WEREWOLF FLOW 90D (MISCELLANEOUS) ×2 IMPLANT
WATER STERILE IRR 500ML POUR (IV SOLUTION) ×2 IMPLANT

## 2021-12-24 NOTE — Assessment & Plan Note (Signed)
Seeing ortho. Planning for shoulder surgery as outlined.  She is very active - walking at least 5.5-8 miles per day.  No chest pain or sob with increased activity or exertion.  Ortho has discussed stopping aspirin and vitamins.  Discussed making no changes in her diabetic medication.  Remain off jardiance.

## 2021-12-24 NOTE — H&P (Signed)
History of Present Illness:  Donna Howell is a 71 y.o. female that presents to clinic today for her preoperative history and evaluation. Patient presents unaccompanied. The patient is scheduled to undergo a right shoulder arthroscopy on 12/24/21 by Dr. Joice Lofts. The patient reports a long history of right shoulder pain that initially started after she tripped and fell over her dog. Pain primarily felt on the lateral arm/shoulder and described as aching, nagging, stabbing, tender. Denies numbness or tingling.  The patient's symptoms have progressed to the point that they decrease her quality of life. The patient has previously undergone conservative treatment including NSAIDS, and activity modification without adequate control of her symptoms.  Patient has received all necessary clearances for surgery.   Past Medical History:   Arthritis   Cardiac murmur   Diabetes (CMS-HCC)   GERD (gastroesophageal reflux disease)   Headache   HTN (hypertension)   Hyperlipidemia   Thyroid disease   Past Surgical History:   APPENDECTOMY 1974   LAPAROSCOPIC ENDOMETRIOSIS FULGURATION 1980's   TONSILLECTOMY & ADENOIDECTOMY 1962   Current Medications:   aspirin 81 MG EC tablet Take 81 mg by mouth once daily   blood glucose diagnostic test strip Check blood sugar tid   calcium polycarbophiL (FIBERCON) 625 mg tablet Take 625 mg by mouth once daily   cholecalciferol (VITAMIN D3) 1000 unit tablet Take by mouth once daily   co-enzyme Q-10, ubiquinone, 200 mg capsule Take 200 mg by mouth once daily   eszopiclone (LUNESTA) 2 MG tablet Take 1 mg by mouth nightly as needed for Sleep Take immediately before bedtime.   JARDIANCE 10 mg tablet Take 10 mg by mouth once daily   levothyroxine (SYNTHROID) 50 MCG tablet Take 1 tablet by mouth once daily   losartan (COZAAR) 25 MG tablet Take 25 mg by mouth once daily   magnesium 250 mg Tab Take 1 tablet by mouth once daily   metFORMIN (GLUCOPHAGE-XR) 500 MG XR tablet Take by  mouth Take 1 tablet (500 mg total) by mouth daily.   metoprolol succinate (TOPROL-XL) 25 MG XL tablet Take 1 tablet (25 mg total) by mouth as directed Take a whole 25 mg tablet in the am and 1/2 of a 25 mg tablet in the evening. 45 tablet 11   multivitamin tablet Take 1 tablet by mouth once daily   omega 3-dha-epa-fish oil 1,000 mg (120 mg-180 mg) Cap Take 1 capsule by mouth once daily   rosuvastatin (CRESTOR) 20 MG tablet Take 1 tablet by mouth once daily   semaglutide (OZEMPIC) 0.25 mg or 0.5 mg(2 mg/1.5 mL) pen injector Inject subcutaneously Inject 0.5 mg into the skin once a week.   Allergies:   Iodinated Contrast Media Other (See Comments)   Percodan [Oxycodone-Aspirin] Rash   Buspirone Hcl Palpitations   Oxycodone-Acetaminophen Rash   Penicillins Rash   Social History:   Socioeconomic History:   Marital status: Married  Occupational History   Occupation: retired  Tobacco Use   Smoking status: Never   Smokeless tobacco: Never  Vaping Use   Vaping Use: Never used  Substance and Sexual Activity   Alcohol use: Never   Drug use: Never   Family History:   Hypotension Father   Stroke Father   Breast cancer Sister 16 (genetic negative)   Alcohol abuse Brother   Diabetes Maternal Grandmother   Myocardial Infarction (Heart attack) Maternal Grandfather   Heart disease Maternal Grandfather   Colon cancer Neg Hx   Review of Systems:  A 10+  ROS was performed, reviewed, and the pertinent orthopaedic findings are documented in the HPI.   Physical Examination:  BP (!) 140/80 (BP Location: Left upper arm, Patient Position: Sitting, BP Cuff Size: Adult)  Ht 165.1 cm (5\' 5" )  Wt 53.1 kg (117 lb)  LMP (LMP Unknown)  BMI 19.47 kg/m   Patient is a well-developed, well-nourished female in no acute distress. Patient has normal mood and affect. Patient is alert and oriented to person, place, and time.   HEENT: Atraumatic, normocephalic. Pupils equal and reactive to light. Extraocular  motion intact. Noninjected sclera.  Cardiovascular: Regular rate and rhythm, with no murmurs, rubs, or gallops. Radial pulse 2+  Respiratory: Lungs clear to auscultation bilaterally.   Right shoulder exam: SKIN: normal SWELLING: none WARMTH: none LYMPH NODES: no adenopathy palpable CREPITUS: none TENDERNESS: Mildly tender over anterior shoulder ROM (active):  Forward flexion: 125 degrees Abduction: 100 degrees Internal rotation: L1 ROM (passive):  Forward flexion: 170 degrees Abduction: 165 degrees  ER/IR at 90 abd: 90 degrees / 65 degrees   She has mild pain at the extremes of forward flexion, abduction, internal rotation, and internal rotation at 90 degrees of abduction.   STRENGTH: Forward flexion: 4-4+/5 Abduction: 4+/5 External rotation: 4-4+/5 Internal rotation: 4/5 Pain with RC testing: Moderate pain with resisted internal rotation more so than with resisted forward flexion   STABILITY: Normal   SPECIAL TESTS: ' test: positive, mild Speed's test: positive Capsulitis - pain w/ passive ER: no Crossed arm test: Mildly positive Crank: Not evaluated Anterior apprehension: Negative Posterior apprehension: Not evaluated  Sensation is intact over the median, radial, ulnar, and axillary nerve distributions. Patient able to make an OK sign, thumbs up, and criss-cross the 2nd and 3rd digits.   Right Shoulder Imaging, MRI: MRI Shoulder Cartilage: No cartilage abnormality. MRI Shoulder Rotator Cuff: Partial thickness tear of the subscapularis. Retracted to the humeral head. MRI Shoulder Labrum / Biceps: Biceps tendinopathy. MRI Shoulder Bone: Normal bone.  Impression:  1. Traumatic complete tear of right rotator cuff.  2. Rotator cuff tendinitis, right.   Plan:  The treatment options, including both surgical and nonsurgical choices, have been discussed in detail with the patient. She would like to proceed with surgical intervention to include a right shoulder  arthroscopy with debridement, decompression, rotator cuff repair, and biceps tenodesis. The risks (including bleeding, infection, nerve and/or blood vessel injury, persistent or recurrent pain, loosening or failure of the components, leg length inequality, dislocation, need for further surgery, blood clots, strokes, heart attacks or arrhythmias, pneumonia, etc.) and benefits of the surgical procedure were discussed. The patient states his/her understanding and agrees to proceed. A formal written consent will be obtained by the nursing staff.   H&P reviewed and patient re-examined. No changes.

## 2021-12-24 NOTE — Anesthesia Procedure Notes (Signed)
Anesthesia Regional Block: Interscalene brachial plexus block  ? ?Pre-Anesthetic Checklist: , timeout performed,  Correct Patient, Correct Site, Correct Laterality,  Correct Procedure, Correct Position, site marked,  Risks and benefits discussed,  Surgical consent,  Pre-op evaluation,  At surgeon's request and post-op pain management ? ?Laterality: Right ? ?Prep: chloraprep     ?  ?Needles:  ?Injection technique: Single-shot ? ?Needle Type: Echogenic Needle   ? ? ?Needle Length: 4cm  ?Needle Gauge: 25  ? ? ? ?Additional Needles: ? ? ?Procedures:,,,, ultrasound used (permanent image in chart),,    ?Narrative:  ?Injection made incrementally with aspirations every 5 mL. ? ?Performed by: Personally  ?Anesthesiologist: Ellanora Rayborn, MD ? ?Additional Notes: ?Patient's chart reviewed and they were deemed appropriate candidate for procedure, at surgeon's request. Patient educated about risks, benefits, and alternatives of the block including but not limited to: temporary or permanent nerve damage, bleeding, infection, damage to surround tissues, pneumothorax, hemidiaphragmatic paralysis, unilateral Horner's syndrome, block failure, local anesthetic toxicity. Patient expressed understanding. A formal time-out was conducted consistent with institution rules. ? ?Monitors were applied, and minimal sedation used (see nursing record). The site was prepped with skin prep and allowed to dry, and sterile gloves were used. A high frequency linear ultrasound probe with probe cover was utilized throughout. C5-7 nerve roots located and appeared anatomically normal, local anesthetic injected around them, and echogenic block needle trajectory was monitored throughout. Aspiration performed every 5ml. Lung and blood vessels were avoided. All injections were performed without resistance and free of blood and paresthesias. The patient tolerated the procedure well. ? ?Injectate: 10ml exparel + 10ml 0.5% bupivacaine  ? ? ? ?

## 2021-12-24 NOTE — Assessment & Plan Note (Signed)
On thyroid replacement.  Follow tsh.  

## 2021-12-24 NOTE — Assessment & Plan Note (Signed)
Continue crestor.  Follow lipid panel and liver function tests.  

## 2021-12-24 NOTE — Assessment & Plan Note (Signed)
On metformin and low dose ozempic.  Not taking jardiance.  Eating better.  Sugars have been under good control.  Hold on making changes in her medication.  Follow met b and a1c.

## 2021-12-24 NOTE — Op Note (Signed)
12/24/2021  12:04 PM  Patient:   Donna Howell  Pre-Op Diagnosis:   Traumatic partial-thickness tear of subscapularis tendon, right shoulder.  Post-Op Diagnosis:   Impingement/tendinopathy with partial-thickness subscapularis tendon tear, labral fraying, and biceps tendinopathy, right shoulder.  Procedure:   Extensive arthroscopic debridement, arthroscopic subacromial decompression, and arthroscopic subscapularis tendon repair with biceps tenolysis, right shoulder.  Anesthesia:   General endotracheal with interscalene block using Exparel placed preoperatively by the anesthesiologist.  Surgeon:   Maryagnes Amos, MD  Assistant:   Horris Latino  Findings:   As above. There was an articular sided partial-thickness tear of the superior half of the subscapularis tendon, involving approximately 40 to 50% of the footprint on the lesser tuberosity. The remainder the rotator cuff was in satisfactory condition. There was degenerative labral fraying anteriorly and superiorly. The biceps tendon demonstrated significant partial-thickness tearing with tendinopathic changes.  The articular surfaces of the glenoid and humerus both were in satisfactory condition.  Complications:   None  Fluids:   700 cc  Estimated blood loss:   2 cc  Tourniquet time:   None  Drains:   None  Closure:   Staples      Brief clinical note:   The patient is a 71 year old female with an approximately 8 to 9-month history of right shoulder pain following an injury in which she fell while walking her dog. The patient's symptoms have progressed despite medications, activity modification, etc. The patient's history and examination are consistent with impingement/tendinopathy with a rotator cuff tear. These findings were confirmed by MRI scan. The patient presents at this time for definitive management of these shoulder symptoms.  Procedure:   The patient underwent placement of an interscalene block using Exparel by the  anesthesiologist in the preoperative holding area before being brought into the operating room and lain in the supine position. The patient then underwent general endotracheal intubation and anesthesia before being repositioned in the beach chair position using the beach chair positioner. The right shoulder and upper extremity were prepped with ChloraPrep solution before being draped sterilely. Preoperative antibiotics were administered. A timeout was performed to confirm the proper surgical site before the expected portal sites and incision site were injected with 0.5% Sensorcaine with epinephrine.   A posterior portal was created and the glenohumeral joint thoroughly inspected with the findings as described above. An anterior portal was created using an outside-in technique. The labrum and rotator cuff were further probed, again confirming the above-noted findings. The areas of labral fraying were debrided back to stable margins using the full-radius resector, as was the torn portion of the biceps tendon and the torn portion of the subscapularis tendon. In addition, areas of synovitis also were debrided back to stable margins using the full-radius resector. The ArthroCare wand was inserted and used to release the biceps tendon from its labral anchor.  It also was used to obtain hemostasis as well as to "anneal" the labrum superiorly and anteriorly. The instruments were removed from the joint after suctioning the excess fluid.  It was elected to repair the articular sided partial thickness subscapularis tendon tear arthroscopically.  This was accomplished using a single Mitek bio knotless anchor placed through the anterior portal.  A separate superolateral working portal was created using an outside in technique to serve as a working portal to assist with this repair.  Through this portal, the exposed portion of the lesser tuberosity was roughened with a full-radius resector before the repair was accomplished.   The integrity  of the repair was assessed both arthroscopically and by gentle external rotation of the arm.  The camera was repositioned through the posterior portal into the subacromial space. A separate lateral portal was created using an outside-in technique. The 3.5 mm full-radius resector was introduced and used to perform a subtotal bursectomy. The ArthroCare wand was then inserted and used to remove the periosteal tissue off the undersurface of the anterior third of the acromion as well as to recess the coracoacromial ligament from its attachment along the anterior and lateral margins of the acromion. The 4.0 mm acromionizing bur was introduced and used to complete the decompression by removing the undersurface of the anterior third of the acromion. The full radius resector was reintroduced to remove any residual bony debris before the ArthroCare wand was reintroduced to obtain hemostasis. The instruments were then removed from the subacromial space after suctioning the excess fluid.  The portal sites were closed using staples. A sterile bulky dressing was applied to the shoulder before the arm was placed into a shoulder immobilizer. The patient was then awakened, extubated, and returned to the recovery room in satisfactory condition after tolerating the procedure well.

## 2021-12-24 NOTE — Anesthesia Preprocedure Evaluation (Signed)
Anesthesia Evaluation  Patient identified by MRN, date of birth, ID band Patient awake  General Assessment Comment:Unclear family hx of perioperative problems. Her daughter coded and had to be in ICU after laparoscopic surgery (does not know details). Her son had a full body rash post op. Patient has not had surgery for 40 years but did not have a problem before. No other family issues. Does not appear to be MH.  Reviewed: Allergy & Precautions, NPO status , Patient's Chart, lab work & pertinent test results  History of Anesthesia Complications (+) Family history of anesthesia reaction and history of anesthetic complications  Airway Mallampati: II  TM Distance: >3 FB Neck ROM: Full    Dental  (+) Lower Dentures   Pulmonary neg pulmonary ROS, neg sleep apnea, neg COPD, Patient abstained from smoking.Not current smoker,    Pulmonary exam normal breath sounds clear to auscultation       Cardiovascular Exercise Tolerance: Good METS: 7 - 9 Mets hypertension, Pt. on medications (-) CAD and (-) Past MI (-) dysrhythmias + Valvular Problems/Murmurs  Rhythm:Regular Rate:Normal - Systolic murmurs Moderate TR; followed by cardiology who say she is asymptomatic, no further management.   Neuro/Psych  Headaches, negative psych ROS   GI/Hepatic GERD  Controlled,(+)     (-) substance abuse  ,   Endo/Other  diabetesHyperthyroidism   Renal/GU negative Renal ROS     Musculoskeletal   Abdominal   Peds  Hematology   Anesthesia Other Findings Past Medical History: No date: Anemia No date: Arthritis No date: Chicken pox No date: Diabetes (HCC) No date: Family history of adverse reaction to anesthesia     Comment:  daughter coded during surgery and son had trouble               breathing during surgery No date: Frequent headaches No date: GERD (gastroesophageal reflux disease) No date: Hay fever No date: Heart murmur No date: High  blood pressure No date: History of bronchitis No date: Hypothyroidism No date: IBS (irritable bowel syndrome) No date: Liver cyst No date: Mitral valve regurgitation No date: Tricuspid regurgitation  Reproductive/Obstetrics                             Anesthesia Physical Anesthesia Plan  ASA: 2  Anesthesia Plan: General   Post-op Pain Management: Regional block* and Ofirmev IV (intra-op)*   Induction: Intravenous  PONV Risk Score and Plan: 3 and Ondansetron, Dexamethasone and Midazolam  Airway Management Planned: Oral ETT  Additional Equipment: None  Intra-op Plan:   Post-operative Plan: Extubation in OR  Informed Consent: I have reviewed the patients History and Physical, chart, labs and discussed the procedure including the risks, benefits and alternatives for the proposed anesthesia with the patient or authorized representative who has indicated his/her understanding and acceptance.     Dental advisory given  Plan Discussed with: CRNA and Surgeon  Anesthesia Plan Comments: (Discussed risks of anesthesia with patient, including PONV, sore throat, lip/dental/eye damage, post operative cognitive dysfunction.. Rare risks discussed as well, such as cardiorespiratory and neurological sequelae, and allergic reactions. Discussed the role of CRNA in patient's perioperative care. Patient understands. Discussed r/b/a of interscalene block, including elective nature. Risks discussed: - Rare: bleeding, infection, nerve damage - shortness of breath from hemidiaphragmatic paralysis - unilateral horner's syndrome - poor/non-working blocks - reactions and toxicity to local anesthetic Patient understands and agrees. )       Anesthesia Quick Evaluation

## 2021-12-24 NOTE — Discharge Instructions (Addendum)
Orthopedic discharge instructions: Keep dressing dry and intact.  May shower after dressing changed on post-op day #4 (Saturday).  Cover staples with Band-Aids after drying off. Apply ice frequently to shoulder. Take ibuprofen 600 mg TID with meals for 5-7 days, then as necessary. Take Tylenol #3 as prescribed when needed.  May supplement with ES Tylenol if necessary. Keep shoulder immobilizer on at all times except may remove for bathing purposes. Follow-up in 10-14 days or as scheduled.  AMBULATORY SURGERY  DISCHARGE INSTRUCTIONS   The drugs that you were given will stay in your system until tomorrow so for the next 24 hours you should not:  Drive an automobile Make any legal decisions Drink any alcoholic beverage   You may resume regular meals tomorrow.  Today it is better to start with liquids and gradually work up to solid foods.  You may eat anything you prefer, but it is better to start with liquids, then soup and crackers, and gradually work up to solid foods.   Please notify your doctor immediately if you have any unusual bleeding, trouble breathing, redness and pain at the surgery site, drainage, fever, or pain not relieved by medication.    Additional Instructions:        Please contact your physician with any problems or Same Day Surgery at [phone], Monday through Friday 6 am to 4 pm, or [place] at [facility] number at [phone].

## 2021-12-24 NOTE — Assessment & Plan Note (Signed)
On losartan.  Follow pressures.  Follow metabolic panel.  

## 2021-12-24 NOTE — Transfer of Care (Signed)
Immediate Anesthesia Transfer of Care Note  Patient: Donna Howell  Procedure(s) Performed: RIGHT SHOULDER ARTHROSCOPY WITH DEBRIDEMENT, DECOMPRESSION, ROTATOR CUFF RELEASE, AND BICEPS TENODESIS (Right: Shoulder)  Patient Location: PACU  Anesthesia Type:General  Level of Consciousness: drowsy  Airway & Oxygen Therapy: Patient Spontanous Breathing and Patient connected to face mask oxygen  Post-op Assessment: Report given to RN and Post -op Vital signs reviewed and stable  Post vital signs: Reviewed and stable  Last Vitals:  Vitals Value Taken Time  BP 123/70 12/24/21 1153  Temp 35.9 1153  Pulse 79 12/24/21 1157  Resp 15   SpO2 100 % 12/24/21 1157  Vitals shown include unvalidated device data.  Last Pain:  Vitals:   12/24/21 0912  TempSrc: Temporal  PainSc: 0-No pain         Complications: No notable events documented.

## 2021-12-24 NOTE — Anesthesia Procedure Notes (Signed)
Procedure Name: Intubation Date/Time: 12/24/2021 10:29 AM Performed by: Morene Crocker, CRNA Pre-anesthesia Checklist: Patient identified, Patient being monitored, Timeout performed, Emergency Drugs available and Suction available Patient Re-evaluated:Patient Re-evaluated prior to induction Oxygen Delivery Method: Circle system utilized Preoxygenation: Pre-oxygenation with 100% oxygen Induction Type: IV induction Ventilation: Mask ventilation without difficulty Laryngoscope Size: 3 and McGraph Grade View: Grade I Tube type: Oral Tube size: 6.5 mm Number of attempts: 1 Airway Equipment and Method: Stylet Placement Confirmation: ETT inserted through vocal cords under direct vision, positive ETCO2 and breath sounds checked- equal and bilateral Secured at: 20 cm Tube secured with: Tape Dental Injury: Teeth and Oropharynx as per pre-operative assessment

## 2021-12-24 NOTE — Assessment & Plan Note (Signed)
Increased stress.  Discussed.  She desires no further intervention at this time.  Follow.

## 2021-12-24 NOTE — Anesthesia Postprocedure Evaluation (Signed)
Anesthesia Post Note  Patient: Donna Howell  Procedure(s) Performed: RIGHT SHOULDER ARTHROSCOPY WITH DEBRIDEMENT, DECOMPRESSION, ROTATOR CUFF RELEASE, AND BICEPS TENODESIS (Right: Shoulder)  Patient location during evaluation: PACU Anesthesia Type: General Level of consciousness: awake and alert Pain management: pain level controlled Vital Signs Assessment: post-procedure vital signs reviewed and stable Respiratory status: spontaneous breathing, nonlabored ventilation, respiratory function stable and patient connected to nasal cannula oxygen Cardiovascular status: blood pressure returned to baseline and stable Postop Assessment: no apparent nausea or vomiting Anesthetic complications: no   No notable events documented.   Last Vitals:  Vitals:   12/24/21 1224 12/24/21 1230  BP: 123/70 132/74  Pulse: 74 69  Resp: 18 16  Temp:    SpO2: 98% 98%    Last Pain:  Vitals:   12/24/21 1230  TempSrc:   PainSc: 0-No pain                 Arita Miss

## 2021-12-25 ENCOUNTER — Encounter: Payer: Self-pay | Admitting: Internal Medicine

## 2021-12-25 ENCOUNTER — Encounter: Payer: Self-pay | Admitting: Surgery

## 2021-12-25 NOTE — Progress Notes (Signed)
CBC is standard of care for a patient with this medical history at our facility for this type of procedure

## 2021-12-26 NOTE — Telephone Encounter (Signed)
Please call Ms Laakso and let her know can take miralax daily to help bowels moving.  If she goes more than a few days without a bowel movement, can try a dulcolax suppository.  Let us know if persistent problems.

## 2021-12-26 NOTE — Telephone Encounter (Signed)
Pt advised .  Gave verbal understanding

## 2021-12-27 DIAGNOSIS — M24111 Other articular cartilage disorders, right shoulder: Secondary | ICD-10-CM | POA: Insufficient documentation

## 2021-12-27 DIAGNOSIS — S46101A Unspecified injury of muscle, fascia and tendon of long head of biceps, right arm, initial encounter: Secondary | ICD-10-CM | POA: Insufficient documentation

## 2021-12-30 DIAGNOSIS — Z9889 Other specified postprocedural states: Secondary | ICD-10-CM | POA: Diagnosis not present

## 2021-12-30 DIAGNOSIS — M25411 Effusion, right shoulder: Secondary | ICD-10-CM | POA: Diagnosis not present

## 2021-12-30 DIAGNOSIS — M25621 Stiffness of right elbow, not elsewhere classified: Secondary | ICD-10-CM | POA: Diagnosis not present

## 2021-12-30 DIAGNOSIS — M25511 Pain in right shoulder: Secondary | ICD-10-CM | POA: Diagnosis not present

## 2021-12-30 DIAGNOSIS — M6281 Muscle weakness (generalized): Secondary | ICD-10-CM | POA: Diagnosis not present

## 2022-01-07 ENCOUNTER — Other Ambulatory Visit: Payer: Self-pay | Admitting: Internal Medicine

## 2022-01-08 DIAGNOSIS — M25511 Pain in right shoulder: Secondary | ICD-10-CM | POA: Diagnosis not present

## 2022-01-08 DIAGNOSIS — M25621 Stiffness of right elbow, not elsewhere classified: Secondary | ICD-10-CM | POA: Diagnosis not present

## 2022-01-08 DIAGNOSIS — M6281 Muscle weakness (generalized): Secondary | ICD-10-CM | POA: Diagnosis not present

## 2022-01-08 DIAGNOSIS — Z9889 Other specified postprocedural states: Secondary | ICD-10-CM | POA: Diagnosis not present

## 2022-01-08 DIAGNOSIS — M25411 Effusion, right shoulder: Secondary | ICD-10-CM | POA: Diagnosis not present

## 2022-01-10 ENCOUNTER — Other Ambulatory Visit (INDEPENDENT_AMBULATORY_CARE_PROVIDER_SITE_OTHER): Payer: Medicare Other

## 2022-01-10 DIAGNOSIS — E039 Hypothyroidism, unspecified: Secondary | ICD-10-CM

## 2022-01-10 DIAGNOSIS — E119 Type 2 diabetes mellitus without complications: Secondary | ICD-10-CM | POA: Diagnosis not present

## 2022-01-10 DIAGNOSIS — E7800 Pure hypercholesterolemia, unspecified: Secondary | ICD-10-CM

## 2022-01-10 DIAGNOSIS — I10 Essential (primary) hypertension: Secondary | ICD-10-CM

## 2022-01-10 DIAGNOSIS — E1165 Type 2 diabetes mellitus with hyperglycemia: Secondary | ICD-10-CM | POA: Diagnosis not present

## 2022-01-10 LAB — HEPATIC FUNCTION PANEL
ALT: 18 U/L (ref 0–35)
AST: 25 U/L (ref 0–37)
Albumin: 4.4 g/dL (ref 3.5–5.2)
Alkaline Phosphatase: 61 U/L (ref 39–117)
Bilirubin, Direct: 0.1 mg/dL (ref 0.0–0.3)
Total Bilirubin: 0.7 mg/dL (ref 0.2–1.2)
Total Protein: 6.9 g/dL (ref 6.0–8.3)

## 2022-01-10 LAB — CBC WITH DIFFERENTIAL/PLATELET
Basophils Absolute: 0 10*3/uL (ref 0.0–0.1)
Basophils Relative: 1.1 % (ref 0.0–3.0)
Eosinophils Absolute: 0.2 10*3/uL (ref 0.0–0.7)
Eosinophils Relative: 5.6 % — ABNORMAL HIGH (ref 0.0–5.0)
HCT: 36.8 % (ref 36.0–46.0)
Hemoglobin: 12.6 g/dL (ref 12.0–15.0)
Lymphocytes Relative: 36.5 % (ref 12.0–46.0)
Lymphs Abs: 1.5 10*3/uL (ref 0.7–4.0)
MCHC: 34.3 g/dL (ref 30.0–36.0)
MCV: 90.1 fl (ref 78.0–100.0)
Monocytes Absolute: 0.5 10*3/uL (ref 0.1–1.0)
Monocytes Relative: 11.1 % (ref 3.0–12.0)
Neutro Abs: 1.9 10*3/uL (ref 1.4–7.7)
Neutrophils Relative %: 45.7 % (ref 43.0–77.0)
Platelets: 270 10*3/uL (ref 150.0–400.0)
RBC: 4.08 Mil/uL (ref 3.87–5.11)
RDW: 12.9 % (ref 11.5–15.5)
WBC: 4.1 10*3/uL (ref 4.0–10.5)

## 2022-01-10 LAB — LIPID PANEL
Cholesterol: 139 mg/dL (ref 0–200)
HDL: 80.7 mg/dL (ref >39.00)
LDL Cholesterol: 44 mg/dL (ref 0–99)
NonHDL: 58.53
Total CHOL/HDL Ratio: 2
Triglycerides: 71 mg/dL (ref 0.0–149.0)
VLDL: 14.2 mg/dL (ref 0.0–40.0)

## 2022-01-10 LAB — MICROALBUMIN / CREATININE URINE RATIO
Creatinine,U: 27.7 mg/dL
Microalb Creat Ratio: 2.5 mg/g (ref 0.0–30.0)
Microalb, Ur: <0.7 mg/dL (ref 0.0–1.9)

## 2022-01-10 LAB — TSH: TSH: 1.02 u[IU]/mL (ref 0.35–5.50)

## 2022-01-10 LAB — BASIC METABOLIC PANEL
BUN: 8 mg/dL (ref 6–23)
CO2: 30 mEq/L (ref 19–32)
Calcium: 9.5 mg/dL (ref 8.4–10.5)
Chloride: 97 mEq/L (ref 96–112)
Creatinine, Ser: 0.87 mg/dL (ref 0.40–1.20)
GFR: 67.46 mL/min (ref >60.00)
Glucose, Bld: 108 mg/dL — ABNORMAL HIGH (ref 70–99)
Potassium: 4.2 mEq/L (ref 3.5–5.1)
Sodium: 133 mEq/L — ABNORMAL LOW (ref 135–145)

## 2022-01-10 LAB — HEMOGLOBIN A1C: Hgb A1c MFr Bld: 6.5 % (ref 4.6–6.5)

## 2022-01-14 DIAGNOSIS — M25411 Effusion, right shoulder: Secondary | ICD-10-CM | POA: Diagnosis not present

## 2022-01-14 DIAGNOSIS — Z9889 Other specified postprocedural states: Secondary | ICD-10-CM | POA: Diagnosis not present

## 2022-01-14 DIAGNOSIS — M6281 Muscle weakness (generalized): Secondary | ICD-10-CM | POA: Diagnosis not present

## 2022-01-14 DIAGNOSIS — M25621 Stiffness of right elbow, not elsewhere classified: Secondary | ICD-10-CM | POA: Diagnosis not present

## 2022-01-14 DIAGNOSIS — M25511 Pain in right shoulder: Secondary | ICD-10-CM | POA: Diagnosis not present

## 2022-01-15 ENCOUNTER — Ambulatory Visit (INDEPENDENT_AMBULATORY_CARE_PROVIDER_SITE_OTHER): Payer: Medicare Other | Admitting: Internal Medicine

## 2022-01-15 ENCOUNTER — Encounter: Payer: Self-pay | Admitting: Internal Medicine

## 2022-01-15 DIAGNOSIS — M25519 Pain in unspecified shoulder: Secondary | ICD-10-CM

## 2022-01-15 DIAGNOSIS — E039 Hypothyroidism, unspecified: Secondary | ICD-10-CM

## 2022-01-15 DIAGNOSIS — I10 Essential (primary) hypertension: Secondary | ICD-10-CM | POA: Diagnosis not present

## 2022-01-15 DIAGNOSIS — F439 Reaction to severe stress, unspecified: Secondary | ICD-10-CM

## 2022-01-15 DIAGNOSIS — E7800 Pure hypercholesterolemia, unspecified: Secondary | ICD-10-CM | POA: Diagnosis not present

## 2022-01-15 DIAGNOSIS — E1165 Type 2 diabetes mellitus with hyperglycemia: Secondary | ICD-10-CM

## 2022-01-15 DIAGNOSIS — L989 Disorder of the skin and subcutaneous tissue, unspecified: Secondary | ICD-10-CM

## 2022-01-15 DIAGNOSIS — E871 Hypo-osmolality and hyponatremia: Secondary | ICD-10-CM | POA: Diagnosis not present

## 2022-01-15 MED ORDER — MUPIROCIN 2 % EX OINT: 1.0000 | TOPICAL_OINTMENT | Freq: Two times a day (BID) | CUTANEOUS | 0 refills | Status: DC

## 2022-01-15 NOTE — Progress Notes (Unsigned)
aPatient ID: Donna Howell, female   DOB: 23-Apr-1951, 71 y.o.   MRN: [id number]   Subjective:    Patient ID: Donna Howell, female    DOB: 07-08-1951, 71 y.o.   MRN: [id number]   Patient here for a scheduled follow up.    Chief Complaint  Patient presents with   Hypertension   Diabetes   .   HPI    Past Medical History:  Diagnosis Date   Anemia    Arthritis    Chicken pox    Diabetes (HCC)    Family history of adverse reaction to anesthesia    daughter coded during surgery and son had trouble breathing during surgery   Frequent headaches    GERD (gastroesophageal reflux disease)    Hay fever    Heart murmur    High blood pressure    History of bronchitis    Hypothyroidism    IBS (irritable bowel syndrome)    Liver cyst    Mitral valve regurgitation    Tricuspid regurgitation    Past Surgical History:  Procedure Laterality Date   APPENDECTOMY  1974   BREAST BIOPSY Left 01/16/2021   Affirm bx-"Ribbon" clip-path pending   CERVICAL CERCLAGE     DIAGNOSTIC LAPAROSCOPY     SHOULDER ARTHROSCOPY WITH SUBACROMIAL DECOMPRESSION, ROTATOR CUFF REPAIR AND BICEP TENDON REPAIR Right 12/24/2021   Procedure: RIGHT SHOULDER ARTHROSCOPY WITH DEBRIDEMENT, DECOMPRESSION, ROTATOR CUFF RELEASE, AND BICEPS TENODESIS;  Surgeon: Corky Mull, MD;  Location: [facility] ORS;  Service: Orthopedics;  Laterality: Right;   TONSILLECTOMY AND ADENOIDECTOMY  1962   Family History  Problem Relation Age of Onset   Arthritis Mother    Heart disease Mother    Diabetes Mother    Stroke Father    Arthritis Sister    Breast cancer Sister 36   Alcoholism Brother    Arthritis Maternal Grandmother    Heart disease Maternal Grandmother    Diabetes Maternal Grandmother    Social History   Socioeconomic History   Marital status: Married    Spouse name: Not on file   Number of children: Not on file   Years of education: Not on file   Highest education level: Not on file  Occupational History    Not on file  Tobacco Use   Smoking status: Never   Smokeless tobacco: Never  Vaping Use   Vaping Use: Never used  Substance and Sexual Activity   Alcohol use: No   Drug use: No   Sexual activity: Not on file  Other Topics Concern   Not on file  Social History Narrative   Not on file   Social Determinants of Health   Financial Resource Strain: Low Risk  (05/02/2021)   Overall Financial Resource Strain (CARDIA)    Difficulty of Paying Living Expenses: Not hard at all  Food Insecurity: No Food Insecurity (02/08/2021)   Hunger Vital Sign    Worried About Running Out of Food in the Last Year: Never true    Ran Out of Food in the Last Year: Never true  Transportation Needs: No Transportation Needs (02/08/2021)   PRAPARE - Hydrologist (Medical): No    Lack of Transportation (Non-Medical): No  Physical Activity: Sufficiently Active (02/25/2021)   Exercise Vital Sign    Days of Exercise per Week: 7 days    Minutes of Exercise per Session: 60 min  Stress: No Stress Concern Present (02/08/2021)   [place] -  Occupational Stress Questionnaire    Feeling of Stress : Not at all  Social Connections: Unknown (02/08/2021)   Social Connection and Isolation Panel [NHANES]    Frequency of Communication with Friends and Family: Not on file    Frequency of Social Gatherings with Friends and Family: Not on file    Attends Religious Services: Not on file    Active Member of Clubs or Organizations: Not on file    Attends Archivist Meetings: Not on file    Marital Status: Married     Review of Systems     Objective:     BP 116/70 (BP Location: Left Arm, Patient Position: Sitting, Cuff Size: Small)   Pulse 62   Temp 98.2 F (36.8 C) (Temporal)   Resp 16   Ht 5' 4"  (1.626 m)   Wt 115 lb 12.8 oz (52.5 kg)   SpO2 97%   BMI 19.88 kg/m  Wt Readings from Last 3 Encounters:  01/15/22 115 lb 12.8 oz (52.5 kg)  12/24/21 110  lb (49.9 kg)  11/06/21 117 lb (53.1 kg)    Physical Exam   Outpatient Encounter Medications as of 01/15/2022  Medication Sig   acetaminophen-codeine (TYLENOL #3) 300-30 MG tablet Take 1-2 tablets by mouth every 6 (six) hours as needed for moderate pain or severe pain.   aspirin 81 MG chewable tablet Chew 81 mg by mouth daily.   blood glucose meter kit and supplies KIT Dispense based on patient and insurance preference. Use up to four times daily as directed.   Coenzyme Q10 (CO Q 10 PO) Take 1 tablet by mouth daily at 6 (six) AM.   Cyanocobalamin (VITAMIN B12 PO) Take 2 tablets by mouth daily at 6 (six) AM. gummies   eszopiclone (LUNESTA) 2 MG TABS tablet Take 1 tablet (2 mg total) by mouth at bedtime. Take immediately before bedtime   glucose blood (FREESTYLE LITE) test strip Use to check blood sugars three times daily. E11.65   Insulin Pen Needle (PEN NEEDLES) 33G X 4 MM MISC Use to inject Ozempic   levothyroxine (SYNTHROID) 50 MCG tablet Take 1 tablet (50 mcg total) by mouth daily before breakfast.   losartan (COZAAR) 25 MG tablet TAKE 1 TABLET DAILY   MAGNESIUM PO Take 1 tablet by mouth daily at 6 (six) AM.   metFORMIN (GLUCOPHAGE-XR) 500 MG 24 hr tablet Take 1 tablet (500 mg total) by mouth daily with breakfast.   metoprolol succinate (TOPROL-XL) 25 MG 24 hr tablet TAKE 1 TABLET IN THE MORNING AND ONE-HALF TABLET (12.5 MG) AT BEDTIME   Multiple Vitamin (MULTIVITAMIN) tablet Take 1 tablet by mouth daily.   Omega-3 Fatty Acids (FISH OIL PO) Take 2 tablets by mouth as needed.   ondansetron (ZOFRAN-ODT) 4 MG disintegrating tablet Take 1 tablet (4 mg total) by mouth every 8 (eight) hours as needed for nausea or vomiting.   polycarbophil (FIBERCON) 625 MG tablet Take 625 mg by mouth daily.   Probiotic Product (PROBIOTIC DAILY PO) Take 1 tablet by mouth daily at 6 (six) AM.   rosuvastatin (CRESTOR) 20 MG tablet Take 1 tablet (20 mg total) by mouth at bedtime.   Semaglutide,0.25 or 0.5MG/DOS,  (OZEMPIC, 0.25 OR 0.5 MG/DOSE,) 2 MG/1.5ML SOPN Inject into the skin once a week.   traMADol (ULTRAM) 50 MG tablet Take 50 mg by mouth every 4 (four) hours as needed.   [DISCONTINUED] acetaminophen-codeine (TYLENOL #3) 300-30 MG tablet Take 1-2 tablets by mouth every 6 (six) hours as  needed for moderate pain or severe pain.   [DISCONTINUED] acetaminophen-codeine (TYLENOL #3) 300-30 MG tablet Take 1-2 tablets by mouth every 6 (six) hours as needed for moderate pain or severe pain.   [DISCONTINUED] ondansetron (ZOFRAN-ODT) 4 MG disintegrating tablet Take 1 tablet (4 mg total) by mouth every 8 (eight) hours as needed for nausea or vomiting.   No facility-administered encounter medications on file as of 01/15/2022.     Lab Results  Component Value Date   WBC 4.1 01/10/2022   HGB 12.6 01/10/2022   HCT 36.8 01/10/2022   PLT 270.0 01/10/2022   GLUCOSE 108 (H) 01/10/2022   CHOL 139 01/10/2022   TRIG 71.0 01/10/2022   HDL 80.70 01/10/2022   LDLCALC 44 01/10/2022   ALT 18 01/10/2022   AST 25 01/10/2022   NA 133 (L) 01/10/2022   K 4.2 01/10/2022   CL 97 01/10/2022   CREATININE 0.87 01/10/2022   BUN 8 01/10/2022   CO2 30 01/10/2022   TSH 1.02 01/10/2022   HGBA1C 6.5 01/10/2022   MICROALBUR <0.7 01/10/2022    [place] OR NERVE BLOCK-IMAGE ONLY [facility])  Result Date: 12/24/2021 There is no interpretation for this exam.  This order is for images obtained during a surgical procedure.  Please See "Surgeries" Tab for more information regarding the procedure.       Assessment & Plan:   Problem List Items Addressed This Visit   None    Einar Pheasant, MD

## 2022-01-20 ENCOUNTER — Encounter: Payer: Self-pay | Admitting: Internal Medicine

## 2022-01-20 DIAGNOSIS — L97509 Non-pressure chronic ulcer of other part of unspecified foot with unspecified severity: Secondary | ICD-10-CM | POA: Insufficient documentation

## 2022-01-20 DIAGNOSIS — L989 Disorder of the skin and subcutaneous tissue, unspecified: Secondary | ICD-10-CM | POA: Insufficient documentation

## 2022-01-20 NOTE — Assessment & Plan Note (Signed)
Bactroban as directed.  Call with update. Discussed podiatry referral.

## 2022-01-20 NOTE — Assessment & Plan Note (Signed)
Increased stress.  Discussed.  She desires no further intervention at this time.  Follow.

## 2022-01-22 DIAGNOSIS — Z9889 Other specified postprocedural states: Secondary | ICD-10-CM | POA: Diagnosis not present

## 2022-01-22 DIAGNOSIS — M25511 Pain in right shoulder: Secondary | ICD-10-CM | POA: Diagnosis not present

## 2022-01-22 DIAGNOSIS — M25621 Stiffness of right elbow, not elsewhere classified: Secondary | ICD-10-CM | POA: Diagnosis not present

## 2022-01-22 DIAGNOSIS — M6281 Muscle weakness (generalized): Secondary | ICD-10-CM | POA: Diagnosis not present

## 2022-01-22 DIAGNOSIS — M25411 Effusion, right shoulder: Secondary | ICD-10-CM | POA: Diagnosis not present

## 2022-01-29 ENCOUNTER — Other Ambulatory Visit: Payer: Self-pay

## 2022-01-29 DIAGNOSIS — M6281 Muscle weakness (generalized): Secondary | ICD-10-CM | POA: Diagnosis not present

## 2022-01-29 DIAGNOSIS — Z9889 Other specified postprocedural states: Secondary | ICD-10-CM | POA: Diagnosis not present

## 2022-01-29 DIAGNOSIS — M25411 Effusion, right shoulder: Secondary | ICD-10-CM | POA: Diagnosis not present

## 2022-01-29 DIAGNOSIS — M25511 Pain in right shoulder: Secondary | ICD-10-CM | POA: Diagnosis not present

## 2022-01-29 DIAGNOSIS — M25621 Stiffness of right elbow, not elsewhere classified: Secondary | ICD-10-CM | POA: Diagnosis not present

## 2022-01-29 MED ORDER — OZEMPIC (0.25 OR 0.5 MG/DOSE) 2 MG/1.5ML ~~LOC~~ SOPN: 0.2500 mg | PEN_INJECTOR | SUBCUTANEOUS | 1 refills | Status: DC

## 2022-01-30 ENCOUNTER — Other Ambulatory Visit (INDEPENDENT_AMBULATORY_CARE_PROVIDER_SITE_OTHER): Payer: Medicare Other

## 2022-01-30 ENCOUNTER — Encounter: Payer: Self-pay | Admitting: Internal Medicine

## 2022-01-30 ENCOUNTER — Other Ambulatory Visit: Payer: Self-pay

## 2022-01-30 ENCOUNTER — Telehealth: Payer: Self-pay | Admitting: Internal Medicine

## 2022-01-30 DIAGNOSIS — E871 Hypo-osmolality and hyponatremia: Secondary | ICD-10-CM

## 2022-01-30 LAB — SODIUM: Sodium: 133 mEq/L — ABNORMAL LOW (ref 135–145)

## 2022-01-30 NOTE — Telephone Encounter (Signed)
S/w Melissa, pharmacist at [facility]. Dosing clarified - as per pt - using .25  Rx put through for pt

## 2022-01-30 NOTE — Telephone Encounter (Signed)
Arlys John from [facility] called stating he need clarification on directions for ozempic   reference number [phone]

## 2022-02-03 ENCOUNTER — Other Ambulatory Visit: Payer: Self-pay

## 2022-02-03 DIAGNOSIS — E871 Hypo-osmolality and hyponatremia: Secondary | ICD-10-CM

## 2022-02-04 ENCOUNTER — Other Ambulatory Visit: Payer: Self-pay | Admitting: Internal Medicine

## 2022-02-05 DIAGNOSIS — M25511 Pain in right shoulder: Secondary | ICD-10-CM | POA: Diagnosis not present

## 2022-02-05 DIAGNOSIS — M6281 Muscle weakness (generalized): Secondary | ICD-10-CM | POA: Diagnosis not present

## 2022-02-05 DIAGNOSIS — M25621 Stiffness of right elbow, not elsewhere classified: Secondary | ICD-10-CM | POA: Diagnosis not present

## 2022-02-05 DIAGNOSIS — Z9889 Other specified postprocedural states: Secondary | ICD-10-CM | POA: Diagnosis not present

## 2022-02-05 DIAGNOSIS — M25411 Effusion, right shoulder: Secondary | ICD-10-CM | POA: Diagnosis not present

## 2022-02-06 ENCOUNTER — Telehealth: Payer: Self-pay | Admitting: Internal Medicine

## 2022-02-06 NOTE — Telephone Encounter (Signed)
Copied from CRM [id number]. Topic: Medicare AWV >> Feb 06, 2022 11:23 AM Payton Doughty wrote: Reason for CRM: Left message for patient to schedule Annual Wellness Visit.  Please schedule with Nurse Health Advisor Denisa O'Brien-Blaney, LPN at [facility]. This appt can be telephone or office visit.  Please call [phone] ask for [facility]

## 2022-02-12 DIAGNOSIS — Z9889 Other specified postprocedural states: Secondary | ICD-10-CM | POA: Diagnosis not present

## 2022-02-12 DIAGNOSIS — M25511 Pain in right shoulder: Secondary | ICD-10-CM | POA: Diagnosis not present

## 2022-02-19 DIAGNOSIS — Z9889 Other specified postprocedural states: Secondary | ICD-10-CM | POA: Diagnosis not present

## 2022-02-19 DIAGNOSIS — M25411 Effusion, right shoulder: Secondary | ICD-10-CM | POA: Diagnosis not present

## 2022-02-19 DIAGNOSIS — M25621 Stiffness of right elbow, not elsewhere classified: Secondary | ICD-10-CM | POA: Diagnosis not present

## 2022-02-19 DIAGNOSIS — M25511 Pain in right shoulder: Secondary | ICD-10-CM | POA: Diagnosis not present

## 2022-02-19 DIAGNOSIS — M6281 Muscle weakness (generalized): Secondary | ICD-10-CM | POA: Diagnosis not present

## 2022-02-28 DIAGNOSIS — M25511 Pain in right shoulder: Secondary | ICD-10-CM | POA: Diagnosis not present

## 2022-02-28 DIAGNOSIS — Z9889 Other specified postprocedural states: Secondary | ICD-10-CM | POA: Diagnosis not present

## 2022-03-03 ENCOUNTER — Encounter: Payer: Self-pay | Admitting: Internal Medicine

## 2022-03-03 NOTE — Telephone Encounter (Signed)
Ok to schedule with me

## 2022-03-05 ENCOUNTER — Other Ambulatory Visit (INDEPENDENT_AMBULATORY_CARE_PROVIDER_SITE_OTHER): Payer: Medicare Other

## 2022-03-05 DIAGNOSIS — E871 Hypo-osmolality and hyponatremia: Secondary | ICD-10-CM | POA: Diagnosis not present

## 2022-03-05 LAB — SODIUM: Sodium: 133 mEq/L — ABNORMAL LOW (ref 135–145)

## 2022-03-07 ENCOUNTER — Telehealth: Payer: Self-pay

## 2022-03-07 ENCOUNTER — Other Ambulatory Visit: Payer: Self-pay

## 2022-03-07 ENCOUNTER — Ambulatory Visit (INDEPENDENT_AMBULATORY_CARE_PROVIDER_SITE_OTHER): Payer: Medicare Other

## 2022-03-07 VITALS — Ht 64.0 in | Wt 115.0 lb

## 2022-03-07 DIAGNOSIS — Z0000 Encounter for general adult medical examination without abnormal findings: Secondary | ICD-10-CM

## 2022-03-07 DIAGNOSIS — E7800 Pure hypercholesterolemia, unspecified: Secondary | ICD-10-CM

## 2022-03-07 DIAGNOSIS — E1165 Type 2 diabetes mellitus with hyperglycemia: Secondary | ICD-10-CM

## 2022-03-07 DIAGNOSIS — I10 Essential (primary) hypertension: Secondary | ICD-10-CM

## 2022-03-07 NOTE — Progress Notes (Signed)
Subjective:   Donna Howell is a 71 y.o. female who presents for Medicare Annual (Subsequent) preventive examination.  Review of Systems    No ROS.  Medicare Wellness Virtual Visit.  Visual/audio telehealth visit, UTA vital signs.   See social history for additional risk factors.   Cardiac Risk Factors include: advanced age (>37mn, >>9women)     Objective:    Today's Vitals   03/07/22 1305  Weight: 115 lb (52.2 kg)  Height: _0  (1.626 m)   Body mass index is 19.74 kg/m.     03/07/2022    1:27 PM 12/19/2021    2:58 PM 02/08/2021   10:05 AM 02/07/2020   10:16 AM 02/04/2019   11:10 AM 02/01/2018    9:44 AM  Advanced Directives  Does Patient Have a Medical Advance Directive? _1  Yes  Type of AParamedicof [place] will  [place] will [place] will [place]Living will [place]Living will  Does patient want to make changes to medical advance directive? No - Patient declined  No - Patient declined No - Patient declined No - Patient declined No - Patient declined  Copy of [place] Chart? No - copy requested  No - copy requested No - copy requested No - copy requested No - copy requested    Current Medications (verified) Outpatient Encounter Medications as of 03/07/2022  Medication Sig   acetaminophen-codeine (TYLENOL #3) 300-30 MG tablet Take 1-2 tablets by mouth every 6 (six) hours as needed for moderate pain or severe pain.   aspirin 81 MG chewable tablet Chew 81 mg by mouth daily.   blood glucose meter kit and supplies KIT Dispense based on patient and insurance preference. Use up to four times daily as directed.   Coenzyme Q10 (CO Q 10 PO) Take 1 tablet by mouth daily at 6 (six) AM.   Cyanocobalamin (VITAMIN B12 PO) Take 2 tablets by mouth daily at 6 (six) AM. gummies   eszopiclone (LUNESTA) 2 MG TABS tablet  TAKE ONE TABLET BY MOUTH AT BEDTIME AS NEEDED FOR SLEEP. TAKE IMMEDIATELY BEFORE BEDTIME.   glucose blood (FREESTYLE LITE) test strip Use to check blood sugars three times daily. E11.65   Insulin Pen Needle (PEN NEEDLES) 33G X 4 MM MISC Use to inject Ozempic   levothyroxine (SYNTHROID) 50 MCG tablet Take 1 tablet (50 mcg total) by mouth daily before breakfast.   losartan (COZAAR) 25 MG tablet TAKE 1 TABLET DAILY   MAGNESIUM PO Take 1 tablet by mouth daily at 6 (six) AM.   metFORMIN (GLUCOPHAGE-XR) 500 MG 24 hr tablet Take 1 tablet (500 mg total) by mouth daily with breakfast.   metoprolol succinate (TOPROL-XL) 25 MG 24 hr tablet TAKE 1 TABLET IN THE MORNING AND ONE-HALF TABLET (12.5 MG) AT BEDTIME   Multiple Vitamin (MULTIVITAMIN) tablet Take 1 tablet by mouth daily.   mupirocin ointment (BACTROBAN) 2 % Apply 1 Application topically 2 (two) times daily.   Omega-3 Fatty Acids (FISH OIL PO) Take 2 tablets by mouth as needed.   ondansetron (ZOFRAN-ODT) 4 MG disintegrating tablet Take 1 tablet (4 mg total) by mouth every 8 (eight) hours as needed for nausea or vomiting.   polycarbophil (FIBERCON) 625 MG tablet Take 625 mg by mouth daily.   Probiotic Product (PROBIOTIC DAILY PO) Take 1 tablet by mouth daily at 6 (six) AM.   rosuvastatin (CRESTOR) 20 MG  tablet Take 1 tablet (20 mg total) by mouth at bedtime.   Semaglutide,0.25 or 0.5MG/DOS, (OZEMPIC, 0.25 OR 0.5 MG/DOSE,) 2 MG/1.5ML SOPN Inject 0.25 mg into the skin once a week.   traMADol (ULTRAM) 50 MG tablet Take 50 mg by mouth every 4 (four) hours as needed.   No facility-administered encounter medications on file as of 03/07/2022.    Allergies (verified) Contrast media [iodinated contrast media], Buspirone hcl, Penicillins, Percocet [oxycodone-acetaminophen], and Percodan [oxycodone-aspirin]   History: Past Medical History:  Diagnosis Date   Anemia    Arthritis    Chicken pox    Diabetes ([place])    Family history of adverse reaction to  anesthesia    daughter coded during surgery and son had trouble breathing during surgery   Frequent headaches    GERD (gastroesophageal reflux disease)    Hay fever    Heart murmur    High blood pressure    History of bronchitis    Hypothyroidism    IBS (irritable bowel syndrome)    Liver cyst    Mitral valve regurgitation    Tricuspid regurgitation    Past Surgical History:  Procedure Laterality Date   APPENDECTOMY  1974   BREAST BIOPSY Left 01/16/2021   Affirm bx-"Ribbon" clip-path pending   CERVICAL CERCLAGE     DIAGNOSTIC LAPAROSCOPY     SHOULDER ARTHROSCOPY WITH SUBACROMIAL DECOMPRESSION, ROTATOR CUFF REPAIR AND BICEP TENDON REPAIR Right 12/24/2021   Procedure: RIGHT SHOULDER ARTHROSCOPY WITH DEBRIDEMENT, DECOMPRESSION, ROTATOR CUFF RELEASE, AND BICEPS TENODESIS;  Surgeon: Corky Mull, MD;  Location: [facility] ORS;  Service: Orthopedics;  Laterality: Right;   TONSILLECTOMY AND ADENOIDECTOMY  1962   Family History  Problem Relation Age of Onset   Arthritis Mother    Heart disease Mother    Diabetes Mother    Stroke Father    Arthritis Sister    Breast cancer Sister 62   Alcoholism Brother    Arthritis Maternal Grandmother    Heart disease Maternal Grandmother    Diabetes Maternal Grandmother    Social History   Socioeconomic History   Marital status: Married    Spouse name: Not on file   Number of children: Not on file   Years of education: Not on file   Highest education level: Not on file  Occupational History   Not on file  Tobacco Use   Smoking status: Never   Smokeless tobacco: Never  Vaping Use   Vaping Use: Never used  Substance and Sexual Activity   Alcohol use: No   Drug use: No   Sexual activity: Not on file  Other Topics Concern   Not on file  Social History Narrative   Not on file   Social Determinants of Health   Financial Resource Strain: Low Risk  (03/07/2022)   Overall Financial Resource Strain (CARDIA)    Difficulty of Paying Living  Expenses: Not hard at all  Food Insecurity: No Food Insecurity (03/07/2022)   Hunger Vital Sign    Worried About Running Out of Food in the Last Year: Never true    Ran Out of Food in the Last Year: Never true  Transportation Needs: No Transportation Needs (03/07/2022)   PRAPARE - Hydrologist (Medical): No    Lack of Transportation (Non-Medical): No  Physical Activity: Sufficiently Active (03/07/2022)   Exercise Vital Sign    Days of Exercise per Week: 7 days    Minutes of Exercise per Session: 60 min  Stress: No Stress Concern Present (03/07/2022)   [place]    Feeling of Stress : Not at all  Social Connections: Unknown (03/07/2022)   Social Connection and Isolation Panel [NHANES]    Frequency of Communication with Friends and Family: Not on file    Frequency of Social Gatherings with Friends and Family: Not on file    Attends Religious Services: Not on Advertising copywriter or Organizations: Not on file    Attends Archivist Meetings: Not on file    Marital Status: Married    Tobacco Counseling Counseling given: Not Answered   Clinical Intake:  Pre-visit preparation completed: Yes        Diabetes: No  How often do you need to have someone help you when you read instructions, pamphlets, or other written materials from your doctor or pharmacy?: 1 - Never   Interpreter Needed?: No    Activities of Daily Living    03/07/2022    1:04 PM 12/19/2021    2:37 PM  In your present state of health, do you have any difficulty performing the following activities:  Hearing? 0   Vision? 0   Difficulty concentrating or making decisions? 0   Walking or climbing stairs? 0   Dressing or bathing? 0   Doing errands, shopping? 0 0  Preparing Food and eating ? N   Using the Toilet? N   In the past six months, have you accidently leaked urine? N   Do you have problems  with loss of bowel control? N   Managing your Medications? N   Managing your Finances? N   Housekeeping or managing your Housekeeping? N    Patient Care Team: Einar Pheasant, MD as PCP - General (Internal Medicine)  Indicate any recent Medical Services you may have received from other than [facility] providers in the past year (date may be approximate).     Assessment:   This is a routine wellness examination for [place].  Virtual Visit via Telephone Note  I connected with  [name] on 03/07/22 at  1:00 PM EDT by telephone and verified that I am speaking with the correct person using two identifiers.  Location: Patient: home  Provider: office Persons participating in the virtual visit: patient/Nurse Health Advisor   I discussed the limitations of performing an evaluation and management service by telehealth. We continued and completed visit with audio only. Some vital signs may be absent or patient reported.   Hearing/Vision screen Hearing Screening - Comments:: Patient is able to hear conversational tones without difficulty. No issues reported. Vision Screening - Comments:: Followed by [facility]  Wears corrective lenses Diabetic Eye Exam; no retinopathy reported  They have regular follow up with the ophthalmologist  Dietary issues and exercise activities discussed: Current Exercise Habits: Home exercise routine, Type of exercise: walking, Time (Minutes): 60, Frequency (Times/Week): 7, Weekly Exercise (Minutes/Week): 420, Intensity: Moderate Healthy diet Good water intake   Goals Addressed               This Visit's Progress     Patient Stated     Follow up with Primary Care Provider (pt-stated)        Keep all routine maintenance appointments. Monitor blood sugar daily.       Depression Screen    03/07/2022    1:25 PM 01/14/2022    2:46 PM 11/06/2021   12:11 PM 04/30/2021  4:31 PM 02/08/2021   10:04 AM 11/20/2020   11:58 AM 02/07/2020   10:16 AM  PHQ  2/9 Scores  PHQ - 2 Score 0 0 1 0 0 0 0    Fall Risk    03/07/2022    1:27 PM 01/14/2022    2:46 PM 11/06/2021   12:11 PM 04/30/2021    4:31 PM 02/08/2021   10:13 AM  [place] in the past year? 0 0 0 0 0  Number falls in past yr: 0   0 0  Injury with Fall?    0 0  Risk for fall due to :  No Fall Risks No Fall Risks    Follow up _0     FALL RISK PREVENTION PERTAINING TO THE HOME: Home free of loose throw rugs in walkways, pet beds, electrical cords, etc? Yes  Adequate lighting in your home to reduce risk of falls? Yes   ASSISTIVE DEVICES UTILIZED TO PREVENT FALLS: Life alert? No  Use of a cane, walker or w/c? No   TIMED UP AND GO: Was the test performed? No .   Cognitive Function: Patient is alert and oriented x3.  Denies difficulty focusing, making decisions, or memory loss.      02/07/2020   10:39 AM 02/01/2018    9:51 AM  MMSE - Mini Mental State Exam  Not completed: Unable to complete   Orientation to time  5  Orientation to Place  5  Registration  3  Attention/ Calculation  5  Recall  3  Language- name 2 objects  2  Language- repeat  1  Language- follow 3 step command  3  Language- read & follow direction  1  Write a sentence  1  Copy design  1  Total score  30        02/04/2019   11:15 AM  6CIT Screen  What Year? 0 points  What month? 0 points  What time? 0 points  Count back from 20 0 points  Months in reverse 0 points  Repeat phrase 0 points  Total Score 0 points    Immunizations Immunization History  Administered Date(s) Administered   Influenza Whole 05/04/2017   Influenza,inj,Quad PF,6+ Mos 05/04/2017   Influenza-Unspecified 05/03/2018, 04/26/2021   Moderna Sars-Covid-2 Vaccination 08/30/2019, 09/27/2019, 04/28/2020, 10/26/2020, 04/19/2021   Pneumococcal Conjugate-13 08/01/2017   Pneumococcal  Polysaccharide-23 10/26/2018   Td 07/11/2018   Zoster, Live 07/11/2013   Shingrix Completed?: No.    Education has been provided regarding the importance of this vaccine. Patient has been advised to call insurance company to determine out of pocket expense if they have not yet received this vaccine. Advised may also receive vaccine at local pharmacy or Health Dept. Verbalized acceptance and understanding.  Screening Tests Health Maintenance  Topic Date Due   INFLUENZA VACCINE  02/25/2022   COVID-19 Vaccine (6 - Moderna series) 03/23/2022 (Originally 06/14/2021)   FOOT EXAM  04/27/2022 (Originally 11/20/2021)   Fecal DNA (Cologuard)  04/27/2022 (Originally 07/09/1996)   Zoster Vaccines- Shingrix (1 of 2) 06/07/2022 (Originally 07/09/2001)   HEMOGLOBIN A1C  07/12/2022   OPHTHALMOLOGY EXAM  10/27/2022   MAMMOGRAM  12/29/2022   TETANUS/TDAP  07/11/2028   Pneumonia Vaccine 46+ Years old  Completed   DEXA SCAN  Completed   Hepatitis C Screening  Completed   HPV VACCINES  Aged Out   Health Maintenance Health Maintenance  Due  Topic Date Due   INFLUENZA VACCINE  02/25/2022   FOBT/FIT- plans to discuss further with PCP and complete.  Lung Cancer Screening: (Low Dose CT Chest recommended if Age 90-80 years, 30 pack-year currently smoking OR have quit w/in 15years.) does not qualify.   Vision Screening: Recommended annual ophthalmology exams for early detection of glaucoma and other disorders of the eye.  Dental Screening: Recommended annual dental exams for proper oral hygiene  Community Resource Referral / Chronic Care Management: CRR required this visit?  No   CCM required this visit?  No      Plan:   Keep all routine maintenance appointments.   I have personally reviewed and noted the following in the patient's chart:   Medical and social history Use of alcohol, tobacco or illicit drugs  Current medications and supplements including opioid prescriptions.  Functional ability  and status Nutritional status Physical activity Advanced directives List of other physicians Hospitalizations, surgeries, and ER visits in previous 12 months Vitals Screenings to include cognitive, depression, and falls Referrals and appointments  In addition, I have reviewed and discussed with patient certain preventive protocols, quality metrics, and best practice recommendations. A written personalized care plan for preventive services as well as general preventive health recommendations were provided to patient.     Varney Biles, LPN   [id number]/33/6122

## 2022-03-07 NOTE — Telephone Encounter (Signed)
ORDERS PLACED LM FOR PT TO CB TO SCHED

## 2022-03-07 NOTE — Telephone Encounter (Signed)
Patient requests labs be ordered and lab appointment scheduled prior to annual exam on 04/30/22.

## 2022-03-07 NOTE — Patient Instructions (Addendum)
  Donna Howell , Thank you for taking time to come for your Medicare Wellness Visit. I appreciate your ongoing commitment to your health goals. Please review the following plan we discussed and let me know if I can assist you in the future.   These are the goals we discussed:  Goals       Patient Stated     Follow up with Primary Care Provider (pt-stated)      Keep all routine maintenance appointments. Monitor blood sugar daily.        This is a list of the screening recommended for you and due dates:  Health Maintenance  Topic Date Due   Flu Shot  02/25/2022   COVID-19 Vaccine (6 - Moderna series) 03/23/2022*   Complete foot exam   04/27/2022*   Cologuard (Stool DNA test)  04/27/2022*   Zoster (Shingles) Vaccine (1 of 2) 06/07/2022*   Hemoglobin A1C  07/12/2022   Eye exam for diabetics  10/27/2022   Mammogram  12/29/2022   Tetanus Vaccine  07/11/2028   Pneumonia Vaccine  Completed   DEXA scan (bone density measurement)  Completed   Hepatitis C Screening: USPSTF Recommendation to screen - Ages 17-79 yo.  Completed   HPV Vaccine  Aged Out  *Topic was postponed. The date shown is not the original due date.

## 2022-03-08 NOTE — Telephone Encounter (Signed)
Noted  

## 2022-04-21 ENCOUNTER — Other Ambulatory Visit: Payer: Self-pay | Admitting: Internal Medicine

## 2022-04-22 ENCOUNTER — Other Ambulatory Visit (INDEPENDENT_AMBULATORY_CARE_PROVIDER_SITE_OTHER): Payer: Medicare Other

## 2022-04-22 ENCOUNTER — Encounter: Payer: Self-pay | Admitting: Internal Medicine

## 2022-04-22 DIAGNOSIS — E7800 Pure hypercholesterolemia, unspecified: Secondary | ICD-10-CM

## 2022-04-22 DIAGNOSIS — E1165 Type 2 diabetes mellitus with hyperglycemia: Secondary | ICD-10-CM | POA: Diagnosis not present

## 2022-04-22 DIAGNOSIS — I10 Essential (primary) hypertension: Secondary | ICD-10-CM

## 2022-04-22 LAB — BASIC METABOLIC PANEL
BUN: 9 mg/dL (ref 6–23)
CO2: 28 mEq/L (ref 19–32)
Calcium: 9.7 mg/dL (ref 8.4–10.5)
Chloride: 100 mEq/L (ref 96–112)
Creatinine, Ser: 0.84 mg/dL (ref 0.40–1.20)
GFR: 70.23 mL/min (ref >60.00)
Glucose, Bld: 105 mg/dL — ABNORMAL HIGH (ref 70–99)
Potassium: 4.1 mEq/L (ref 3.5–5.1)
Sodium: 135 mEq/L (ref 135–145)

## 2022-04-22 LAB — HEPATIC FUNCTION PANEL
ALT: 25 U/L (ref 0–35)
AST: 33 U/L (ref 0–37)
Albumin: 4.4 g/dL (ref 3.5–5.2)
Alkaline Phosphatase: 66 U/L (ref 39–117)
Bilirubin, Direct: 0.1 mg/dL (ref 0.0–0.3)
Total Bilirubin: 0.6 mg/dL (ref 0.2–1.2)
Total Protein: 6.8 g/dL (ref 6.0–8.3)

## 2022-04-22 LAB — LIPID PANEL
Cholesterol: 146 mg/dL (ref 0–200)
HDL: 87.9 mg/dL (ref >39.00)
LDL Cholesterol: 46 mg/dL (ref 0–99)
NonHDL: 57.61
Total CHOL/HDL Ratio: 2
Triglycerides: 57 mg/dL (ref 0.0–149.0)
VLDL: 11.4 mg/dL (ref 0.0–40.0)

## 2022-04-22 LAB — HEMOGLOBIN A1C: Hgb A1c MFr Bld: 6.6 % — ABNORMAL HIGH (ref 4.6–6.5)

## 2022-04-23 ENCOUNTER — Other Ambulatory Visit: Payer: Self-pay

## 2022-04-23 DIAGNOSIS — E119 Type 2 diabetes mellitus without complications: Secondary | ICD-10-CM

## 2022-04-23 MED ORDER — METFORMIN HCL ER 500 MG PO TB24: ORAL_TABLET | ORAL | 3 refills | Status: DC

## 2022-04-23 NOTE — Telephone Encounter (Signed)
I called patient & she will d/c Ozempic. She is okay with starting 1000 mg of metformin xr BID & I have sent in for patient to pharmacy. She will let us know if any issues.

## 2022-04-23 NOTE — Telephone Encounter (Signed)
I am ok with her stopping ozempic and taking the metformin xr - total of 1000mg  bid.  Will need to monitor for bowel issues, etc.

## 2022-04-30 ENCOUNTER — Ambulatory Visit (INDEPENDENT_AMBULATORY_CARE_PROVIDER_SITE_OTHER): Payer: Medicare Other | Admitting: Internal Medicine

## 2022-04-30 DIAGNOSIS — I10 Essential (primary) hypertension: Secondary | ICD-10-CM

## 2022-05-01 ENCOUNTER — Encounter: Payer: Self-pay | Admitting: Internal Medicine

## 2022-05-01 NOTE — Progress Notes (Signed)
Appt was canceled (changed).  Pt was not seen.  

## 2022-05-03 ENCOUNTER — Other Ambulatory Visit: Payer: Self-pay | Admitting: Internal Medicine

## 2022-05-03 DIAGNOSIS — Z23 Encounter for immunization: Secondary | ICD-10-CM | POA: Diagnosis not present

## 2022-05-05 NOTE — Telephone Encounter (Signed)
Rx ok'd for lunesta #30 with 2 refills.

## 2022-05-13 ENCOUNTER — Ambulatory Visit (INDEPENDENT_AMBULATORY_CARE_PROVIDER_SITE_OTHER): Payer: Medicare Other | Admitting: Internal Medicine

## 2022-05-13 ENCOUNTER — Encounter: Payer: Self-pay | Admitting: Internal Medicine

## 2022-05-13 DIAGNOSIS — E1165 Type 2 diabetes mellitus with hyperglycemia: Secondary | ICD-10-CM

## 2022-05-13 DIAGNOSIS — F439 Reaction to severe stress, unspecified: Secondary | ICD-10-CM | POA: Diagnosis not present

## 2022-05-13 DIAGNOSIS — E039 Hypothyroidism, unspecified: Secondary | ICD-10-CM | POA: Diagnosis not present

## 2022-05-13 DIAGNOSIS — I10 Essential (primary) hypertension: Secondary | ICD-10-CM | POA: Diagnosis not present

## 2022-05-13 DIAGNOSIS — E7800 Pure hypercholesterolemia, unspecified: Secondary | ICD-10-CM

## 2022-05-13 DIAGNOSIS — G479 Sleep disorder, unspecified: Secondary | ICD-10-CM | POA: Diagnosis not present

## 2022-05-13 DIAGNOSIS — E871 Hypo-osmolality and hyponatremia: Secondary | ICD-10-CM | POA: Diagnosis not present

## 2022-05-13 NOTE — Progress Notes (Signed)
Patient ID: Donna Howell, female   DOB: Dec 24, 1950, 71 y.o.   MRN: [id number]   Subjective:    Patient ID: Donna Howell, female    DOB: 10/10/1950, 71 y.o.   MRN: [id number]   Patient here for  Chief Complaint  Patient presents with   Follow-up   .   HPI Here to follow up regarding her blood sugar, blood pressure and cholesterol.  Is s/p extensive arthroscopic debridement, subacromial decompression, and arthroscopic subscapularis tendon repair with biceps tenolysis of the right shoulder. Being followed by ortho.  Mother recently passed away.  Increased stress related.  Discussed.  Does not feel needs any further intervention at this time.  No chest pain.  Breathing stable.  No increased cough or congestion.  She is concerned regarding her blood sugar and a1c.  A1c last lab 6.6.  off ozempic - weight loss.  On 1037m bid metformin.  Increased GI side effects.  Discussed adjusting medication to allow her to recover.     Past Medical History:  Diagnosis Date   Anemia    Arthritis    Chicken pox    Diabetes (HCC)    Family history of adverse reaction to anesthesia    daughter coded during surgery and son had trouble breathing during surgery   Frequent headaches    GERD (gastroesophageal reflux disease)    Hay fever    Heart murmur    High blood pressure    History of bronchitis    Hypothyroidism    IBS (irritable bowel syndrome)    Liver cyst    Mitral valve regurgitation    Tricuspid regurgitation    Past Surgical History:  Procedure Laterality Date   APPENDECTOMY  1974   BREAST BIOPSY Left 01/16/2021   Affirm bx-"Ribbon" clip-path pending   CERVICAL CERCLAGE     DIAGNOSTIC LAPAROSCOPY     SHOULDER ARTHROSCOPY WITH SUBACROMIAL DECOMPRESSION, ROTATOR CUFF REPAIR AND BICEP TENDON REPAIR Right 12/24/2021   Procedure: RIGHT SHOULDER ARTHROSCOPY WITH DEBRIDEMENT, DECOMPRESSION, ROTATOR CUFF RELEASE, AND BICEPS TENODESIS;  Surgeon: PCorky Mull MD;  Location: [facility] ORS;   Service: Orthopedics;  Laterality: Right;   TONSILLECTOMY AND ADENOIDECTOMY  1962   Family History  Problem Relation Age of Onset   Arthritis Mother    Heart disease Mother    Diabetes Mother    Stroke Father    Arthritis Sister    Breast cancer Sister 521  Alcoholism Brother    Arthritis Maternal Grandmother    Heart disease Maternal Grandmother    Diabetes Maternal Grandmother    Social History   Socioeconomic History   Marital status: Married    Spouse name: Not on file   Number of children: Not on file   Years of education: Not on file   Highest education level: Not on file  Occupational History   Not on file  Tobacco Use   Smoking status: Never   Smokeless tobacco: Never  Vaping Use   Vaping Use: Never used  Substance and Sexual Activity   Alcohol use: No   Drug use: No   Sexual activity: Not on file  Other Topics Concern   Not on file  Social History Narrative   Not on file   Social Determinants of Health   Financial Resource Strain: Low Risk  (03/07/2022)   Overall Financial Resource Strain (CARDIA)    Difficulty of Paying Living Expenses: Not hard at all  Food Insecurity: No Food Insecurity (03/07/2022)   Hunger Vital  Sign    Worried About Charity fundraiser in the Last Year: Never true    [place] in the Last Year: Never true  Transportation Needs: No Transportation Needs (03/07/2022)   PRAPARE - Hydrologist (Medical): No    Lack of Transportation (Non-Medical): No  Physical Activity: Sufficiently Active (03/07/2022)   Exercise Vital Sign    Days of Exercise per Week: 7 days    Minutes of Exercise per Session: 60 min  Stress: No Stress Concern Present (03/07/2022)   [place]    Feeling of Stress : Not at all  Social Connections: Unknown (03/07/2022)   Social Connection and Isolation Panel [NHANES]    Frequency of Communication with Friends and  Family: Not on file    Frequency of Social Gatherings with Friends and Family: Not on file    Attends Religious Services: Not on file    Active Member of Clubs or Organizations: Not on file    Attends Archivist Meetings: Not on file    Marital Status: Married     Review of Systems  Constitutional:  Negative for fever.       She is eating.    HENT:  Negative for congestion and sinus pressure.   Respiratory:  Negative for cough, chest tightness and shortness of breath.   Cardiovascular:  Negative for chest pain, palpitations and leg swelling.  Gastrointestinal:  Negative for abdominal pain, diarrhea, nausea and vomiting.  Genitourinary:  Negative for difficulty urinating and dysuria.  Musculoskeletal:  Negative for joint swelling and myalgias.  Skin:  Negative for color change and rash.  Neurological:  Negative for dizziness, light-headedness and headaches.  Psychiatric/Behavioral:  Negative for agitation and dysphoric mood.        Objective:     BP 118/78 (BP Location: Left Arm, Patient Position: Sitting, Cuff Size: Normal)   Pulse 67   Temp 98.2 F (36.8 C) (Oral)   Ht 5' 4"  (1.626 m)   Wt 116 lb 9.6 oz (52.9 kg)   SpO2 97%   BMI 20.01 kg/m  Wt Readings from Last 3 Encounters:  05/13/22 116 lb 9.6 oz (52.9 kg)  03/07/22 115 lb (52.2 kg)  01/15/22 115 lb 12.8 oz (52.5 kg)    Physical Exam Vitals reviewed.  Constitutional:      General: She is not in acute distress.    Appearance: Normal appearance.  HENT:     Head: Normocephalic and atraumatic.     Right Ear: External ear normal.     Left Ear: External ear normal.  Eyes:     General: No scleral icterus.       Right eye: No discharge.        Left eye: No discharge.     Conjunctiva/sclera: Conjunctivae normal.  Neck:     Thyroid: No thyromegaly.  Cardiovascular:     Rate and Rhythm: Normal rate and regular rhythm.  Pulmonary:     Effort: No respiratory distress.     Breath sounds: Normal breath  sounds. No wheezing.  Abdominal:     General: Bowel sounds are normal.     Palpations: Abdomen is soft.     Tenderness: There is no abdominal tenderness.  Musculoskeletal:        General: No swelling or tenderness.     Cervical back: Neck supple. No tenderness.  Lymphadenopathy:     Cervical: No cervical adenopathy.  Skin:    Findings: No erythema or rash.  Neurological:     Mental Status: She is alert.  Psychiatric:        Mood and Affect: Mood normal.        Behavior: Behavior normal.      Outpatient Encounter Medications as of 05/13/2022  Medication Sig   acetaminophen-codeine (TYLENOL #3) 300-30 MG tablet Take 1-2 tablets by mouth every 6 (six) hours as needed for moderate pain or severe pain.   aspirin 81 MG chewable tablet Chew 81 mg by mouth daily.   blood glucose meter kit and supplies KIT Dispense based on patient and insurance preference. Use up to four times daily as directed.   Coenzyme Q10 (CO Q 10 PO) Take 1 tablet by mouth daily at 6 (six) AM.   Cyanocobalamin (VITAMIN B12 PO) Take 2 tablets by mouth daily at 6 (six) AM. gummies   eszopiclone (LUNESTA) 2 MG TABS tablet TAKE ONE TABLET BY MOUTH AT BEDTIME AS NEEDED FOR SLEEP (TAKE IMMEDIATELY BEFORE BEDTIME)   glucose blood (FREESTYLE LITE) test strip Use to check blood sugars three times daily. E11.65   Insulin Pen Needle (PEN NEEDLES) 33G X 4 MM MISC Use to inject Ozempic   levothyroxine (SYNTHROID) 50 MCG tablet TAKE 1 TABLET DAILY   losartan (COZAAR) 25 MG tablet TAKE 1 TABLET DAILY   MAGNESIUM PO Take 1 tablet by mouth daily at 6 (six) AM.   metFORMIN (GLUCOPHAGE-XR) 500 MG 24 hr tablet Take two tablets (1000 mg) by mouth in the morning and two tablets by mouth (1000 mg) every evening.   metoprolol succinate (TOPROL-XL) 25 MG 24 hr tablet TAKE 1 TABLET IN THE MORNING AND ONE-HALF TABLET (12.5 MG) AT BEDTIME   Multiple Vitamin (MULTIVITAMIN) tablet Take 1 tablet by mouth daily.   mupirocin ointment (BACTROBAN) 2  % Apply 1 Application topically 2 (two) times daily.   Omega-3 Fatty Acids (FISH OIL PO) Take 2 tablets by mouth as needed.   ondansetron (ZOFRAN-ODT) 4 MG disintegrating tablet Take 1 tablet (4 mg total) by mouth every 8 (eight) hours as needed for nausea or vomiting.   polycarbophil (FIBERCON) 625 MG tablet Take 625 mg by mouth daily.   Probiotic Product (PROBIOTIC DAILY PO) Take 1 tablet by mouth daily at 6 (six) AM.   rosuvastatin (CRESTOR) 20 MG tablet TAKE 1 TABLET DAILY   traMADol (ULTRAM) 50 MG tablet Take 50 mg by mouth every 4 (four) hours as needed.   No facility-administered encounter medications on file as of 05/13/2022.     Lab Results  Component Value Date   WBC 4.1 01/10/2022   HGB 12.6 01/10/2022   HCT 36.8 01/10/2022   PLT 270.0 01/10/2022   GLUCOSE 105 (H) 04/22/2022   CHOL 146 04/22/2022   TRIG 57.0 04/22/2022   HDL 87.90 04/22/2022   LDLCALC 46 04/22/2022   ALT 25 04/22/2022   AST 33 04/22/2022   NA 135 04/22/2022   K 4.1 04/22/2022   CL 100 04/22/2022   CREATININE 0.84 04/22/2022   BUN 9 04/22/2022   CO2 28 04/22/2022   TSH 1.02 01/10/2022   HGBA1C 6.6 (H) 04/22/2022   MICROALBUR <0.7 01/10/2022    [place] OR NERVE BLOCK-IMAGE ONLY [facility])  Result Date: 12/24/2021 There is no interpretation for this exam.  This order is for images obtained during a surgical procedure.  Please See "Surgeries" Tab for more information regarding the procedure.       Assessment & Plan:  Problem List Items Addressed This Visit     Difficulty sleeping    Continues on lunesta.       Hypercholesterolemia    Continue crestor.  Follow lipid panel and liver function tests.        Relevant Orders   Hepatic function panel   Lipid panel   Hypertension, essential    On losartan.  Follow pressures.  Follow metabolic panel.       Hyponatremia    Follow sodium.       Hypothyroidism    On thyroid replacement.  Follow tsh.       Stress    Increased stress.  Discussed.   Does not feel needs any further intervention at this time.  No SI.  Follow.       Type 2 diabetes mellitus with hyperglycemia (HCC)    On metformin - now 1033m bid.  Off ozempic.  Not taking jardiance. Discussed.  Will remain off ozempic.  Weight loss.  With increased bowel issues, decrease metformin back down to 5035mbid.  Follow met b and a1c.       Relevant Orders   Hemoglobin A1L2X Basic metabolic panel     ChEinar PheasantMD

## 2022-05-18 ENCOUNTER — Encounter: Payer: Self-pay | Admitting: Internal Medicine

## 2022-05-18 NOTE — Assessment & Plan Note (Signed)
Increased stress.  Discussed.  Does not feel needs any further intervention at this time.  No SI.  Follow.  

## 2022-05-18 NOTE — Assessment & Plan Note (Signed)
On thyroid replacement.  Follow tsh.  

## 2022-05-18 NOTE — Assessment & Plan Note (Signed)
Continue crestor.  Follow lipid panel and liver function tests.  

## 2022-05-18 NOTE — Assessment & Plan Note (Signed)
Follow sodium.  

## 2022-05-18 NOTE — Assessment & Plan Note (Signed)
Continues on lunesta.  

## 2022-05-18 NOTE — Assessment & Plan Note (Signed)
On metformin - now 1065m bid.  Off ozempic.  Not taking jardiance. Discussed.  Will remain off ozempic.  Weight loss.  With increased bowel issues, decrease metformin back down to 5042mbid.  Follow met b and a1c.

## 2022-05-18 NOTE — Assessment & Plan Note (Signed)
On losartan.  Follow pressures.  Follow metabolic panel.  

## 2022-06-17 ENCOUNTER — Ambulatory Visit
Admission: EM | Admit: 2022-06-17 | Discharge: 2022-06-17 | Disposition: A | Discharge status: Home / Self Care | Payer: Medicare Other | Attending: Internal Medicine | Admitting: Internal Medicine

## 2022-06-17 DIAGNOSIS — J029 Acute pharyngitis, unspecified: Secondary | ICD-10-CM

## 2022-06-17 NOTE — ED Triage Notes (Signed)
Pt. Presents to UC w/ c/o a sore throat for the past 4 days. Pt. Endorses no other symptoms.

## 2022-06-17 NOTE — Discharge Instructions (Signed)
Continue to maintain adequate hydration Warm salt water gargles will help with throat pain Humidifier with vapor rub will help with cough Lemon tea with honey will help with cough and nasal congestion as well Your throat exam is not suggestive of strep throat Return to urgent care if you have any other symptoms.

## 2022-06-17 NOTE — ED Provider Notes (Signed)
Roderic Palau    CSN: [id number] Arrival date & time: 06/17/22  1735      History   Chief Complaint Chief Complaint  Patient presents with   Sore Throat    HPI Donna Howell is a 71 y.o. female to urgent care with complaints of sore throat of 4 days duration.  Patient says sore throat started insidiously and has been persistent.  She endorses runny nose but no cough or sputum production.  No shortness of breath or wheezing.  No nausea or vomiting.  No generalized bodyaches.  No sick contacts.  No diarrhea.   HPI  Past Medical History:  Diagnosis Date   Anemia    Arthritis    Chicken pox    Diabetes ([place])    Family history of adverse reaction to anesthesia    daughter coded during surgery and son had trouble breathing during surgery   Frequent headaches    GERD (gastroesophageal reflux disease)    Hay fever    Heart murmur    High blood pressure    History of bronchitis    Hypothyroidism    IBS (irritable bowel syndrome)    Liver cyst    Mitral valve regurgitation    Tricuspid regurgitation     Patient Active Problem List   Diagnosis Date Noted   Sore on toe 01/20/2022   Skin lesion 01/20/2022   Hyponatremia 01/15/2022   Injury of tendon of long head of right biceps 12/27/2021   Rotator cuff tendinitis, right 08/12/2021   Traumatic complete tear of right rotator cuff 08/12/2021   Dysuria 08/02/2021   Shoulder pain 06/22/2021   Abnormal mammogram 03/31/2021   Right leg pain 11/25/2020   Rectal lump 09/22/2020   Leg cramps 04/01/2020   Hypercholesterolemia 07/16/2019   Stress 07/16/2019   Environmental allergies 10/30/2018   Difficulty sleeping 06/26/2018   Tricuspid valve insufficiency 04/07/2018   Fluttering heart 11/07/2017   Raynaud's disease 05/09/2017   Side pain 05/09/2017   Healthcare maintenance 12/06/2016   Back pain 11/09/2016   Colon cancer screening 11/09/2016   Type 2 diabetes mellitus with hyperglycemia ([place]) 11/06/2016    Hypertension, essential 11/06/2016   Hypothyroidism 11/06/2016    Past Surgical History:  Procedure Laterality Date   APPENDECTOMY  1974   BREAST BIOPSY Left 01/16/2021   Affirm bx-"Ribbon" clip-path pending   CERVICAL CERCLAGE     DIAGNOSTIC LAPAROSCOPY     SHOULDER ARTHROSCOPY WITH SUBACROMIAL DECOMPRESSION, ROTATOR CUFF REPAIR AND BICEP TENDON REPAIR Right 12/24/2021   Procedure: RIGHT SHOULDER ARTHROSCOPY WITH DEBRIDEMENT, DECOMPRESSION, ROTATOR CUFF RELEASE, AND BICEPS TENODESIS;  Surgeon: Corky Mull, MD;  Location: [facility] ORS;  Service: Orthopedics;  Laterality: Right;   TONSILLECTOMY AND ADENOIDECTOMY  1962    OB History   No obstetric history on file.      Home Medications    Prior to Admission medications   Medication Sig Start Date End Date Taking? Authorizing Provider  meloxicam (MOBIC) 15 MG tablet Take 1 tablet every day by oral route. 05/21/21  Yes [provider]  OZEMPIC, 0.25 OR 0.5 MG/DOSE, 2 MG/3ML SOPN Inject into the skin. 03/08/22  Yes [provider]  acetaminophen-codeine (TYLENOL #3) 300-30 MG tablet Take 1-2 tablets by mouth every 6 (six) hours as needed for moderate pain or severe pain. 12/24/21   Poggi, Marshall Cork, MD  aspirin 81 MG chewable tablet Chew 81 mg by mouth daily.    [provider]  blood glucose meter kit and  supplies KIT Dispense based on patient and insurance preference. Use up to four times daily as directed. 02/25/21   Einar Pheasant, MD  ciprofloxacin (CIPRO) 500 MG tablet TAKE ONE TABLET BY MOUTH TWICE A DAY FOR 3 DAYS    [provider]  Coenzyme Q10 (CO Q 10 PO) Take 1 tablet by mouth daily at 6 (six) AM.    [provider]  Cyanocobalamin (VITAMIN B12 PO) Take 2 tablets by mouth daily at 6 (six) AM. gummies    [provider]  eszopiclone (LUNESTA) 2 MG TABS tablet TAKE ONE TABLET BY MOUTH AT BEDTIME AS NEEDED FOR SLEEP (TAKE IMMEDIATELY BEFORE BEDTIME) 05/05/22   Einar Pheasant, MD   glucose blood (FREESTYLE LITE) test strip Use to check blood sugars three times daily. E11.65 11/06/21   Einar Pheasant, MD  Insulin Pen Needle (PEN NEEDLES) 33G X 4 MM MISC Use to inject Ozempic 05/02/21   Einar Pheasant, MD  levothyroxine (SYNTHROID) 50 MCG tablet TAKE 1 TABLET DAILY 04/21/22   Einar Pheasant, MD  losartan (COZAAR) 25 MG tablet TAKE 1 TABLET DAILY 01/07/22   Einar Pheasant, MD  MAGNESIUM PO Take 1 tablet by mouth daily at 6 (six) AM.    [provider]  metFORMIN (GLUCOPHAGE-XR) 500 MG 24 hr tablet Take two tablets (1000 mg) by mouth in the morning and two tablets by mouth (1000 mg) every evening. 04/23/22   Einar Pheasant, MD  metoprolol succinate (TOPROL-XL) 25 MG 24 hr tablet TAKE 1 TABLET IN THE MORNING AND ONE-HALF TABLET (12.5 MG) AT BEDTIME 12/09/21   Einar Pheasant, MD  Multiple Vitamin (MULTIVITAMIN) tablet Take 1 tablet by mouth daily.    [provider]  mupirocin ointment (BACTROBAN) 2 % Apply 1 Application topically 2 (two) times daily. 01/15/22   Einar Pheasant, MD  nitrofurantoin, macrocrystal-monohydrate, (MACROBID) 100 MG capsule Take 1 capsule by mouth 2 (two) times daily.    [provider]  Omega-3 Fatty Acids (FISH OIL PO) Take 2 tablets by mouth as needed.    [provider]  ondansetron (ZOFRAN-ODT) 4 MG disintegrating tablet Take 1 tablet (4 mg total) by mouth every 8 (eight) hours as needed for nausea or vomiting. 12/24/21   Poggi, Marshall Cork, MD  polycarbophil (FIBERCON) 625 MG tablet Take 625 mg by mouth daily.    [provider]  Probiotic Product (PROBIOTIC DAILY PO) Take 1 tablet by mouth daily at 6 (six) AM.    [provider]  rosuvastatin (CRESTOR) 20 MG tablet TAKE 1 TABLET DAILY 04/21/22   Einar Pheasant, MD  traMADol (ULTRAM) 50 MG tablet Take 50 mg by mouth every 4 (four) hours as needed. 01/07/22   [provider]    Family History Family History  Problem Relation Age of Onset    Arthritis Mother    Heart disease Mother    Diabetes Mother    Stroke Father    Arthritis Sister    Breast cancer Sister 54   Alcoholism Brother    Arthritis Maternal Grandmother    Heart disease Maternal Grandmother    Diabetes Maternal Grandmother     Social History Social History   Tobacco Use   Smoking status: Never   Smokeless tobacco: Never  Vaping Use   Vaping Use: Never used  Substance Use Topics   Alcohol use: No   Drug use: No     Allergies   Contrast media [iodinated contrast media], Buspirone hcl, Penicillins, Percocet [oxycodone-acetaminophen], and Percodan [oxycodone-aspirin]  Review of Systems Review of Systems  HENT:  Positive for sore throat.      Physical Exam Triage Vital Signs ED Triage Vitals  Enc Vitals Group     BP 06/17/22 1841 (!) 154/71     Pulse Rate 06/17/22 1841 (!) 59     Resp 06/17/22 1841 16     Temp 06/17/22 1841 98 F (36.7 C)     Temp Source 06/17/22 1841 Oral     SpO2 06/17/22 1841 98 %     Weight --      Height --      Head Circumference --      Peak Flow --      Pain Score 06/17/22 1838 0     Pain Loc --      Pain Edu? --      Excl. in [place]? --    No data found.  Updated Vital Signs BP (!) 154/71 (BP Location: Left Arm)   Pulse (!) 59 Comment: Pt states her pulse rate runs low.  Temp 98 F (36.7 C) (Oral)   Resp 16   SpO2 98%   Visual Acuity Right Eye Distance:   Left Eye Distance:   Bilateral Distance:    Right Eye Near:   Left Eye Near:    Bilateral Near:     Physical Exam Constitutional:      Appearance: She is well-developed.  HENT:     Right Ear: Tympanic membrane normal.     Left Ear: Tympanic membrane normal.     Mouth/Throat:     Mouth: Mucous membranes are moist.     Pharynx: No pharyngeal swelling, oropharyngeal exudate or posterior oropharyngeal erythema.     Tonsils: No tonsillar exudate or tonsillar abscesses. 1+ on the right. 1+ on the left.  Cardiovascular:     Rate and Rhythm:  Normal rate and regular rhythm.     Heart sounds: Normal heart sounds.  Pulmonary:     Effort: Pulmonary effort is normal.     Breath sounds: No wheezing or rhonchi.  Musculoskeletal:     Cervical back: Normal range of motion.  Neurological:     Mental Status: She is alert.      UC Treatments / Results  Labs (all labs ordered are listed, but only abnormal results are displayed) Labs Reviewed - No data to display  EKG   Radiology No results found.  Procedures Procedures (including critical care time)  Medications Ordered in UC Medications - No data to display  Initial Impression / Assessment and Plan / UC Course  I have reviewed the triage vital signs and the nursing notes.  Pertinent labs & imaging results that were available during my care of the patient were reviewed by me and considered in my medical decision making (see chart for details).     1.  Acute viral pharyngitis: Positive care recommended Patient already had a negative home COVID test She came to the clinic out of concern for strep throat.  Her physical exam is not consistent with strep throat. Patient agrees to go home and continue home remedies for sore throat.  I offered Tessalon Perles but patient rather prefers to use home remedies. Final Clinical Impressions(s) / UC Diagnoses   Final diagnoses:  Acute viral pharyngitis     Discharge Instructions      Continue to maintain adequate hydration Warm salt water gargles will help with throat pain Humidifier with vapor rub will help with cough Lemon tea with  honey will help with cough and nasal congestion as well Your throat exam is not suggestive of strep throat Return to urgent care if you have any other symptoms.   ED Prescriptions   None    PDMP not reviewed this encounter.   Chase Picket, MD 06/17/22 [phone]

## 2022-07-14 ENCOUNTER — Encounter: Payer: Self-pay | Admitting: Internal Medicine

## 2022-07-15 ENCOUNTER — Telehealth (INDEPENDENT_AMBULATORY_CARE_PROVIDER_SITE_OTHER): Payer: Medicare Other | Admitting: Family Medicine

## 2022-07-15 VITALS — Temp 97.6°F | Ht 65.0 in | Wt 116.0 lb

## 2022-07-15 DIAGNOSIS — U071 COVID-19: Secondary | ICD-10-CM

## 2022-07-15 MED ORDER — MOLNUPIRAVIR EUA 200MG CAPSULE: 4.0000 | ORAL_CAPSULE | Freq: Two times a day (BID) | ORAL | 0 refills | Status: AC

## 2022-07-15 NOTE — Telephone Encounter (Signed)
Please confirm how high her sugars are running.  Given she is covid positive, I would like to hold on making any changes at this time in her diabetic medications.

## 2022-07-15 NOTE — Patient Instructions (Addendum)
HOME CARE TIPS:   -I sent the medication(s) we discussed to your pharmacy: Meds ordered this encounter  Medications   molnupiravir EUA (LAGEVRIO) 200 mg CAPS capsule    Sig: Take 4 capsules (800 mg total) by mouth 2 (two) times daily for 5 days.    Dispense:  40 capsule    Refill:  0     -I sent in the Covid19 treatment or referral you requested per our discussion. Please see the information provided below and discuss further with the pharmacist/treatment team.   -there is a chance of rebound illness with covid after improving. This can happen whether or not you take an antiviral treatment. If you become sick again with covid after getting better, please schedule a follow up virtual visit and isolate again.  -can use tylenol if needed for fevers, aches and pains per instructions  -nasal saline sinus rinses twice daily  -stay hydrated, drink plenty of fluids and eat small healthy meals - avoid dairy  -follow up with your doctor in 2-3 days unless improving and feeling better  -stay home while sick, except to seek medical care. If you have COVID19, you will likely be contagious for 7-10 days. Flu or Influenza is likely contagious for about 7 days. Other respiratory viral infections remain contagious for 5-10+ days depending on the virus and many other factors. Wear a good mask that fits snugly (such as N95 or KN95) if around others to reduce the risk of transmission.  It was nice to meet you today, and I really hope you are feeling better soon. I help [facility] out with telemedicine visits on Tuesdays and Thursdays and am happy to help if you need a follow up virtual visit on those days. Otherwise, if you have any concerns or questions following this visit please schedule a follow up visit with your Primary Care doctor or seek care at a local urgent care clinic to avoid delays in care.    Seek in person care or schedule a follow up video visit promptly if your symptoms worsen, new  concerns arise or you are not improving with treatment. Call 911 and/or seek emergency care if your symptoms are severe or life threatening.  PLEASE SEE THE FOLLOWING LINK FOR THE MOST UPDATED INFORMATION ABOUT LAGEVRIO:  [email or web address]      Fact Sheet for Patients And Caregivers Emergency Use Authorization (EUA) Of LAGEVRIOT (molnupiravir) capsules For Coronavirus Disease 2019 (COVID-19)  What is the most important information I should know about LAGEVRIO? LAGEVRIO may cause serious side effects, including: ? LAGEVRIO may cause harm to your unborn baby. It is not known if LAGEVRIO will harm your baby if you take LAGEVRIO during pregnancy. o LAGEVRIO is not recommended for use in pregnancy. o LAGEVRIO has not been studied in pregnancy. LAGEVRIO was studied in pregnant animals only. When LAGEVRIO was given to pregnant animals, LAGEVRIO caused harm to their unborn babies. o You and your healthcare provider may decide that you should take LAGEVRIO during pregnancy if there are no other COVID-19 treatment options approved or authorized by the FDA that are accessible or clinically appropriate for you. o If you and your healthcare provider decide that you should take LAGEVRIO during pregnancy, you and your healthcare provider should discuss the known and potential benefits and the potential risks of taking LAGEVRIO during pregnancy. For individuals who are able to become pregnant: ? You should use a reliable method of birth control (contraception) consistently and correctly during treatment with LAGEVRIO and for   4 days after the last dose of LAGEVRIO. Talk to your healthcare provider about reliable birth control methods. ? Before starting treatment with LAGEVRIO your healthcare provider may do a pregnancy test to see if you are pregnant before starting treatment with LAGEVRIO. ? Tell your healthcare provider right away if you become pregnant or think you may be pregnant during  treatment with LAGEVRIO. Pregnancy Surveillance Program: ? There is a pregnancy surveillance program for individuals who take LAGEVRIO during pregnancy. The purpose of this program is to collect information about the health of you and your baby. Talk to your healthcare provider about how to take part in this program. ? If you take LAGEVRIO during pregnancy and you agree to participate in the pregnancy surveillance program and allow your healthcare provider to share your information with [employer], then your healthcare provider will report your use of LAGEVRIO during pregnancy to [employer] by calling 1-877-888-4231 or pregnancyreporting.msd.com. For individuals who are sexually active with partners who are able to become pregnant: ? It is not known if LAGEVRIO can affect sperm. While the risk is regarded as low, animal studies to fully assess the potential for LAGEVRIO to affect the babies of males treated with LAGEVRIO have not been completed. A reliable method of birth control (contraception) should be used consistently and correctly during treatment with LAGEVRIO and for at least 3 months after the last dose. The risk to sperm beyond 3 months is not known. Studies to understand the risk to sperm beyond 3 months are ongoing. Talk to your healthcare provider about reliable birth control methods. Talk to your healthcare provider if you have questions or concerns about how LAGEVRIO may affect sperm. You are being given this fact sheet because your healthcare provider believes it is necessary to provide you with LAGEVRIO for the treatment of adults with mild-to-moderate coronavirus disease 2019 (COVID-19) with positive results of direct SARS-CoV-2 viral testing, and who are at high risk for progression to severe COVID-19 including hospitalization or death, and for whom other COVID-19 treatment options approved or authorized by the FDA are not accessible or clinically  appropriate. The U.S. Food and Drug Administration (FDA) has issued an Emergency Use Authorization (EUA) to make LAGEVRIO available during the COVID-19 pandemic (for more details about an EUA please see "What is an Emergency Use Authorization?" at the end of this document). LAGEVRIO is not an FDA-approved medicine in the [place]. Read this Fact Sheet for information about LAGEVRIO. Talk to your healthcare provider about your options if you have any questions. It is your choice to take LAGEVRIO.  What is COVID-19? COVID-19 is caused by a virus called a coronavirus. You can get COVID-19 through close contact with another person who has the virus. COVID-19 illnesses have ranged from very mild-to-severe, including illness resulting in death. While information so far suggests that most COVID-19 illness is mild, serious illness can happen and may cause some of your other medical conditions to become worse. Older people and people of all ages with severe, long lasting (chronic) medical conditions like heart disease, lung disease and diabetes, for example seem to be at higher risk of being hospitalized for COVID-19.  What is LAGEVRIO? LAGEVRIO is an investigational medicine used to treat mild-to-moderate COVID-19 in adults: ? with positive results of direct SARS-CoV-2 viral testing, and ? who are at high risk for progression to severe COVID-19 including hospitalization or death, and for whom other COVID-19 treatment options approved or authorized   by the FDA are not accessible or clinically appropriate. The FDA has authorized the emergency use of LAGEVRIO for the treatment of mild-tomoderate COVID-19 in adults under an EUA. For more information on EUA, see the "What is an Emergency Use Authorization (EUA)?" section at the end of this Fact Sheet. LAGEVRIO is not authorized: ? for use in people less than 18 years of age. ? for prevention of COVID-19. ? for people needing hospitalization for  COVID-19. ? for use for longer than 5 consecutive days.  What should I tell my healthcare provider before I take LAGEVRIO? Tell your healthcare provider if you: ? Have any allergies ? Are breastfeeding or plan to breastfeed ? Have any serious illnesses ? Are taking any medicines (prescription, over-the-counter, vitamins, or herbal products).  How do I take LAGEVRIO? ? Take LAGEVRIO exactly as your healthcare provider tells you to take it. ? Take 4 capsules of LAGEVRIO every 12 hours (for example, at 8 am and at 8 pm) ? Take LAGEVRIO for 5 days. It is important that you complete the full 5 days of treatment with LAGEVRIO. Do not stop taking LAGEVRIO before you complete the full 5 days of treatment, even if you feel better. ? Take LAGEVRIO with or without food. ? You should stay in isolation for as long as your healthcare provider tells you to. Talk to your healthcare provider if you are not sure about how to properly isolate while you have COVID-19. ? Swallow LAGEVRIO capsules whole. Do not open, break, or crush the capsules. If you cannot swallow capsules whole, tell your healthcare provider. ? What to do if you miss a dose: o If it has been less than 10 hours since the missed dose, take it as soon as you remember o If it has been more than 10 hours since the missed dose, skip the missed dose and take your dose at the next scheduled time. ? Do not double the dose of LAGEVRIO to make up for a missed dose.  What are the important possible side effects of LAGEVRIO? ? See, "What is the most important information I should know about LAGEVRIO?" ? Allergic Reactions. Allergic reactions can happen in people taking LAGEVRIO, even after only 1 dose. Stop taking LAGEVRIO and call your healthcare provider right away if you get any of the following symptoms of an allergic reaction: o hives o rapid heartbeat o trouble swallowing or breathing o swelling of the mouth, lips, or face o throat  tightness o hoarseness o skin rash The most common side effects of LAGEVRIO are: ? diarrhea ? nausea ? dizziness These are not all the possible side effects of LAGEVRIO. Not many people have taken LAGEVRIO. Serious and unexpected side effects may happen. This medicine is still being studied, so it is possible that all of the risks are not known at this time.  What other treatment choices are there?  Veklury (remdesivir) is FDA-approved as an intravenous (IV) infusion for the treatment of mildto-moderate COVID-19 in certain adults and children. Talk with your doctor to see if Veklury is appropriate for you. Like LAGEVRIO, FDA may also allow for the emergency use of other medicines to treat people with COVID-19. Go to https://www.fda.gov/emergency-preparedness-and-response/mcm-legalregulatory-and-policy-framework/emergency-use-authorization for more information. It is your choice to be treated or not to be treated with LAGEVRIO. Should you decide not to take it, it will not change your standard medical care.  What if I am breastfeeding? Breastfeeding is not recommended during treatment with LAGEVRIO and for 4   days after the last dose of LAGEVRIO. If you are breastfeeding or plan to breastfeed, talk to your healthcare provider about your options and specific situation before taking LAGEVRIO.  How do I report side effects with LAGEVRIO? Contact your healthcare provider if you have any side effects that bother you or do not go away. Report side effects to FDA MedWatch at www.fda.gov/medwatch or call 1-800-FDA-1088 (1- 800-332-1088).  How should I store LAGEVRIO? ? Store LAGEVRIO capsules at room temperature between 68F to 77F (20C to 25C). ? Keep LAGEVRIO and all medicines out of the reach of children and pets. How can I learn more about COVID-19? ? Ask your healthcare provider. ? Visit www.cdc.gov/COVID19 ? Contact your local or state public health department. ? Call [employer] at 1-800-672-6372 (toll free in the [place] ? Visit www.molnupiravir.com  What Is an Emergency Use Authorization (EUA)? The [place] FDA has made LAGEVRIO available under an emergency access mechanism called an Emergency Use Authorization (EUA) The EUA is supported by a Secretary of Health and Human Service (HHS) declaration that circumstances exist to justify emergency use of drugs and biological products during the COVID-19 pandemic. LAGEVRIO for the treatment of mild-to-moderate COVID-19 in adults with positive results of direct SARS-CoV-2 viral testing, who are at high risk for progression to severe COVID-19, including hospitalization or death, and for whom alternative COVID-19 treatment options approved or authorized by FDA are not accessible or clinically appropriate, has not undergone the same type of review as an FDA-approved product. In issuing an EUA under the COVID-19 public health emergency, the FDA has determined, among other things, that based on the total amount of scientific evidence available including data from adequate and well-controlled clinical trials, if available, it is reasonable to believe that the product may be effective for diagnosing, treating, or preventing COVID-19, or a serious or life-threatening disease or condition caused by COVID-19; that the known and potential benefits of the product, when used to diagnose, treat, or prevent such disease or condition, outweigh the known and potential risks of such product; and that there are no adequate, approved, and available alternatives.  All of these criteria must be met to allow for the product to be used in the treatment of patients during the COVID-19 pandemic. The EUA for LAGEVRIO is in effect for the duration of the COVID-19 declaration justifying emergency use of LAGEVRIO, unless terminated or revoked (after which LAGEVRIO may no longer be used under the EUA). For patent information:  www.msd.com/research/patent Copyright  2021-2022 [employer] [place], [place] and its affiliates. All rights reserved. [id number] Revised: March 2022  

## 2022-07-15 NOTE — Progress Notes (Signed)
Virtual Visit via Video Note  I connected with Donna Howell  on 07/15/22 at  4:20 PM EST by a video enabled telemedicine application and verified that I am speaking with the correct person using two identifiers.  Location patient:  Location provider:work or home office Persons participating in the virtual visit: patient, provider  I discussed the limitations and requested verbal permission for telemedicine visit. The patient expressed understanding and agreed to proceed.   HPI:  Acute telemedicine visit for Donna Howell: -Onset: about 2 days ago -Symptoms include:nasal congestion, cough, sore throat, headache, mild body aches -Denies: fever, CP, SOB, NVD -Has tried:tylenol -Pertinent past medical history: see below, GFR was  70 in last few months -Pertinent medication allergies: Allergies  Allergen Reactions   Contrast Media [Iodinated Contrast Media] Other (See Comments)    Infection    Buspirone Hcl Palpitations   Penicillins Rash   Percocet [Oxycodone-Acetaminophen] Rash   Percodan [Oxycodone-Aspirin] Rash  -COVID-19 vaccine status:  Immunization History  Administered Date(s) Administered   Influenza Whole 05/04/2017   Influenza,inj,Quad PF,6+ Mos 05/04/2017   Influenza-Unspecified 05/03/2018, 04/26/2021   Moderna Sars-Covid-2 Vaccination 08/30/2019, 09/27/2019, 04/28/2020, 10/26/2020, 04/19/2021   Pneumococcal Conjugate-13 08/01/2017   Pneumococcal Polysaccharide-23 10/26/2018   Td 07/11/2018   Zoster, Live 07/11/2013     ROS: See pertinent positives and negatives per HPI.  Past Medical History:  Diagnosis Date   Anemia    Arthritis    Chicken pox    Diabetes (HCC)    Family history of adverse reaction to anesthesia    daughter coded during surgery and son had trouble breathing during surgery   Frequent headaches    GERD (gastroesophageal reflux disease)    Hay fever    Heart murmur    High blood pressure    History of bronchitis    Hypothyroidism    IBS  (irritable bowel syndrome)    Liver cyst    Mitral valve regurgitation    Tricuspid regurgitation     Past Surgical History:  Procedure Laterality Date   APPENDECTOMY  1974   BREAST BIOPSY Left 01/16/2021   Affirm bx-"Ribbon" clip-path pending   CERVICAL CERCLAGE     DIAGNOSTIC LAPAROSCOPY     SHOULDER ARTHROSCOPY WITH SUBACROMIAL DECOMPRESSION, ROTATOR CUFF REPAIR AND BICEP TENDON REPAIR Right 12/24/2021   Procedure: RIGHT SHOULDER ARTHROSCOPY WITH DEBRIDEMENT, DECOMPRESSION, ROTATOR CUFF RELEASE, AND BICEPS TENODESIS;  Surgeon: Corky Mull, MD;  Location: [facility];  Service: Orthopedics;  Laterality: Right;   TONSILLECTOMY AND ADENOIDECTOMY  1962     Current Outpatient Medications:    aspirin 81 MG chewable tablet, Chew 81 mg by mouth daily., Disp: , Rfl:    blood glucose meter kit and supplies KIT, Dispense based on patient and insurance preference. Use up to four times daily as directed., Disp: 1 each, Rfl: 3   Coenzyme Q10 (CO Q 10 PO), Take 1 tablet by mouth daily at 6 (six) AM., Disp: , Rfl:    Cyanocobalamin (VITAMIN B12 PO), Take 2 tablets by mouth daily at 6 (six) AM. gummies, Disp: , Rfl:    eszopiclone (LUNESTA) 2 MG TABS tablet, TAKE ONE TABLET BY MOUTH AT BEDTIME AS NEEDED FOR SLEEP (TAKE IMMEDIATELY BEFORE BEDTIME), Disp: 30 tablet, Rfl: 2   glucose blood (FREESTYLE LITE) test strip, Use to check blood sugars three times daily. E11.65, Disp: 100 strip, Rfl: 3   Insulin Pen Needle (PEN NEEDLES) 33G X 4 MM MISC, Use to inject Ozempic, Disp: 100 each, Rfl: 3  levothyroxine (SYNTHROID) 50 MCG tablet, TAKE 1 TABLET DAILY, Disp: 90 tablet, Rfl: 3   losartan (COZAAR) 25 MG tablet, TAKE 1 TABLET DAILY, Disp: 90 tablet, Rfl: 3   MAGNESIUM PO, Take 1 tablet by mouth daily at 6 (six) AM., Disp: , Rfl:    metFORMIN (GLUCOPHAGE-XR) 500 MG 24 hr tablet, Take two tablets (1000 mg) by mouth in the morning and two tablets by mouth (1000 mg) every evening., Disp: 120 tablet, Rfl: 3    metoprolol succinate (TOPROL-XL) 25 MG 24 hr tablet, TAKE 1 TABLET IN THE MORNING AND ONE-HALF TABLET (12.5 MG) AT BEDTIME, Disp: 135 tablet, Rfl: 3   molnupiravir EUA (LAGEVRIO) 200 mg CAPS capsule, Take 4 capsules (800 mg total) by mouth 2 (two) times daily for 5 days., Disp: 40 capsule, Rfl: 0   Multiple Vitamin (MULTIVITAMIN) tablet, Take 1 tablet by mouth daily., Disp: , Rfl:    Omega-3 Fatty Acids (FISH OIL PO), Take 2 tablets by mouth as needed., Disp: , Rfl:    polycarbophil (FIBERCON) 625 MG tablet, Take 625 mg by mouth daily., Disp: , Rfl:    Probiotic Product (PROBIOTIC DAILY PO), Take 1 tablet by mouth daily at 6 (six) AM., Disp: , Rfl:    rosuvastatin (CRESTOR) 20 MG tablet, TAKE 1 TABLET DAILY, Disp: 90 tablet, Rfl: 3   mupirocin ointment (BACTROBAN) 2 %, Apply 1 Application topically 2 (two) times daily. (Patient not taking: Reported on 07/15/2022), Disp: 22 g, Rfl: 0   ondansetron (ZOFRAN-ODT) 4 MG disintegrating tablet, Take 1 tablet (4 mg total) by mouth every 8 (eight) hours as needed for nausea or vomiting. (Patient not taking: Reported on 07/15/2022), Disp: 20 tablet, Rfl: 1   OZEMPIC, 0.25 OR 0.5 MG/DOSE, 2 MG/3ML SOPN, Inject into the skin. (Patient not taking: Reported on 07/15/2022), Disp: , Rfl:   EXAM:  VITALS per patient if applicable:  GENERAL: alert, oriented, appears well and in no acute distress  HEENT: atraumatic, conjunttiva clear, no obvious abnormalities on inspection of external nose and ears  NECK: normal movements of the head and neck  LUNGS: on inspection no signs of respiratory distress, breathing rate appears normal, no obvious gross SOB, gasping or wheezing  CV: no obvious cyanosis  MS: moves all visible extremities without noticeable abnormality  PSYCH/NEURO: pleasant and cooperative, no obvious depression or anxiety, speech and thought processing grossly intact  ASSESSMENT AND PLAN:  Discussed the following assessment and  plan:  COVID-19   Discussed treatment options, side effect and risk of drug interactions, ideal treatment window, potential complications, isolation and precautions for COVID-19. After lengthy discussion, the patient opted for treatment with Legevrio due to being higher risk for complications of covid or severe disease and other factors. Discussed EUA status of this drug and the fact that there is preliminary limited knowledge of risks/interactions/side effects per EUA document vs possible benefits and precautions. This information was shared with patient during the visit and also was provided in patient instructions. Also, advised that patient discuss risks/interactions and use with pharmacist/treatment team as well.   Other symptomatic care measures summarized in patient instructions.  Advised to seek prompt virtual visit or in person care if worsening, new symptoms arise, or if is not improving with treatment as expected per our conversation of expected course. Discussed options for follow up care. Did let this patient know that I do telemedicine on Tuesdays and Thursdays for [facility] and those are the days I am logged into the system. Advised to schedule  follow up visit with PCP, [facility] virtual visits or [facility] if any further questions or concerns to avoid delays in care.   I discussed the assessment and treatment plan with the patient. The patient was provided an opportunity to ask questions and all were answered. The patient agreed with the plan and demonstrated an understanding of the instructions.     Lucretia Kern, DO

## 2022-08-01 ENCOUNTER — Other Ambulatory Visit: Payer: Self-pay | Admitting: Internal Medicine

## 2022-08-05 ENCOUNTER — Other Ambulatory Visit (INDEPENDENT_AMBULATORY_CARE_PROVIDER_SITE_OTHER): Payer: Medicare Other

## 2022-08-05 DIAGNOSIS — E1165 Type 2 diabetes mellitus with hyperglycemia: Secondary | ICD-10-CM | POA: Diagnosis not present

## 2022-08-05 DIAGNOSIS — E7800 Pure hypercholesterolemia, unspecified: Secondary | ICD-10-CM | POA: Diagnosis not present

## 2022-08-05 LAB — BASIC METABOLIC PANEL
BUN: 9 mg/dL (ref 6–23)
CO2: 31 mEq/L (ref 19–32)
Calcium: 9.5 mg/dL (ref 8.4–10.5)
Chloride: 99 mEq/L (ref 96–112)
Creatinine, Ser: 0.83 mg/dL (ref 0.40–1.20)
GFR: 71.1 mL/min (ref >60.00)
Glucose, Bld: 110 mg/dL — ABNORMAL HIGH (ref 70–99)
Potassium: 4.5 mEq/L (ref 3.5–5.1)
Sodium: 136 mEq/L (ref 135–145)

## 2022-08-05 LAB — HEPATIC FUNCTION PANEL
ALT: 18 U/L (ref 0–35)
AST: 24 U/L (ref 0–37)
Albumin: 4.3 g/dL (ref 3.5–5.2)
Alkaline Phosphatase: 54 U/L (ref 39–117)
Bilirubin, Direct: 0.1 mg/dL (ref 0.0–0.3)
Total Bilirubin: 0.4 mg/dL (ref 0.2–1.2)
Total Protein: 6.9 g/dL (ref 6.0–8.3)

## 2022-08-05 LAB — LIPID PANEL
Cholesterol: 136 mg/dL (ref 0–200)
HDL: 77.9 mg/dL (ref >39.00)
LDL Cholesterol: 42 mg/dL (ref 0–99)
NonHDL: 57.92
Total CHOL/HDL Ratio: 2
Triglycerides: 78 mg/dL (ref 0.0–149.0)
VLDL: 15.6 mg/dL (ref 0.0–40.0)

## 2022-08-05 LAB — HEMOGLOBIN A1C: Hgb A1c MFr Bld: 6.9 % — ABNORMAL HIGH (ref 4.6–6.5)

## 2022-08-08 ENCOUNTER — Encounter: Payer: Self-pay | Admitting: Internal Medicine

## 2022-08-08 ENCOUNTER — Ambulatory Visit (INDEPENDENT_AMBULATORY_CARE_PROVIDER_SITE_OTHER): Payer: Medicare Other | Admitting: Internal Medicine

## 2022-08-08 VITALS — BP 130/78 | HR 54 | Temp 98.4°F | Ht 65.0 in | Wt 118.0 lb

## 2022-08-08 DIAGNOSIS — Z1211 Encounter for screening for malignant neoplasm of colon: Secondary | ICD-10-CM

## 2022-08-08 DIAGNOSIS — E7800 Pure hypercholesterolemia, unspecified: Secondary | ICD-10-CM | POA: Diagnosis not present

## 2022-08-08 DIAGNOSIS — I10 Essential (primary) hypertension: Secondary | ICD-10-CM

## 2022-08-08 DIAGNOSIS — F439 Reaction to severe stress, unspecified: Secondary | ICD-10-CM | POA: Diagnosis not present

## 2022-08-08 DIAGNOSIS — I7300 Raynaud's syndrome without gangrene: Secondary | ICD-10-CM | POA: Diagnosis not present

## 2022-08-08 DIAGNOSIS — E1165 Type 2 diabetes mellitus with hyperglycemia: Secondary | ICD-10-CM | POA: Diagnosis not present

## 2022-08-08 DIAGNOSIS — G479 Sleep disorder, unspecified: Secondary | ICD-10-CM

## 2022-08-08 DIAGNOSIS — E039 Hypothyroidism, unspecified: Secondary | ICD-10-CM

## 2022-08-08 LAB — HM DIABETES FOOT EXAM

## 2022-08-08 MED ORDER — OZEMPIC (0.25 OR 0.5 MG/DOSE) 2 MG/3ML ~~LOC~~ SOPN: 0.2500 mg | PEN_INJECTOR | SUBCUTANEOUS | 2 refills | Status: DC

## 2022-08-08 NOTE — Assessment & Plan Note (Addendum)
On metformin - now 1500mg  per day.  Off ozempic.  Not taking jardiance. Discussed.  Wants to go back on ozempic.  Will start .25mg .  Follow closely.  Hold on making changes in metformin dose at this time.  Follow met b and a1c.

## 2022-08-08 NOTE — Progress Notes (Unsigned)
Patient ID: Donna Howell, female   DOB: 05/22/1951, 72 y.o.   MRN: [id number]   Subjective:    Patient ID: Donna Howell, female    DOB: [birth year], 72 y.o.   MRN: [id number]   Patient here for  Chief Complaint  Patient presents with   Medical Management of Chronic Issues   Diabetes   Hypertension   Hyperlipidemia   .   HPI Here to follow up regarding her blood sugar, blood pressure and cholesterol.  Is s/p extensive arthroscopic debridement, subacromial decompression, and arthroscopic subscapularis tendon repair with biceps tenolysis of the right shoulder. Being followed by ortho.  Mother and a friend recently passed away.  Increased stress related.  Discussed grief counseling. Does not feel needs any further intervention at this time.  No chest pain.  Breathing stable.  No increased cough or congestion currently.  Recently had covid. Symptoms have significantly improved. She is concerned regarding her blood sugar and a1c.  A1c last lab 6.9.  increased. off ozempic - weight loss.  On metformin. Taking total of 1500mg  per day.  Desires to start back on ozempic.  Eating well.  Weight is up a few pounds.     Past Medical History:  Diagnosis Date   Anemia    Arthritis    Chicken pox    Diabetes (HCC)    Family history of adverse reaction to anesthesia    daughter coded during surgery and son had trouble breathing during surgery   Frequent headaches    GERD (gastroesophageal reflux disease)    Hay fever    Heart murmur    High blood pressure    History of bronchitis    Hypothyroidism    IBS (irritable bowel syndrome)    Liver cyst    Mitral valve regurgitation    Tricuspid regurgitation    Past Surgical History:  Procedure Laterality Date   APPENDECTOMY  1974   BREAST BIOPSY Left 01/16/2021   Affirm bx-"Ribbon" clip-path pending   CERVICAL CERCLAGE     DIAGNOSTIC LAPAROSCOPY     SHOULDER ARTHROSCOPY WITH SUBACROMIAL DECOMPRESSION, ROTATOR CUFF REPAIR AND BICEP TENDON  REPAIR Right 12/24/2021   Procedure: RIGHT SHOULDER ARTHROSCOPY WITH DEBRIDEMENT, DECOMPRESSION, ROTATOR CUFF RELEASE, AND BICEPS TENODESIS;  Surgeon: Corky Mull, MD;  Location: [facility] ORS;  Service: Orthopedics;  Laterality: Right;   TONSILLECTOMY AND ADENOIDECTOMY  1962   Family History  Problem Relation Age of Onset   Arthritis Mother    Heart disease Mother    Diabetes Mother    Stroke Father    Arthritis Sister    Breast cancer Sister 4   Alcoholism Brother    Arthritis Maternal Grandmother    Heart disease Maternal Grandmother    Diabetes Maternal Grandmother    Social History   Socioeconomic History   Marital status: Married    Spouse name: Not on file   Number of children: Not on file   Years of education: Not on file   Highest education level: Not on file  Occupational History   Not on file  Tobacco Use   Smoking status: Never   Smokeless tobacco: Never  Vaping Use   Vaping Use: Never used  Substance and Sexual Activity   Alcohol use: No   Drug use: No   Sexual activity: Not on file  Other Topics Concern   Not on file  Social History Narrative   Not on file   Social Determinants of Health   Financial Resource Strain: Low  Risk  (03/07/2022)   Overall Financial Resource Strain (CARDIA)    Difficulty of Paying Living Expenses: Not hard at all  Food Insecurity: No Food Insecurity (03/07/2022)   Hunger Vital Sign    Worried About Running Out of Food in the Last Year: Never true    Ran Out of Food in the Last Year: Never true  Transportation Needs: No Transportation Needs (03/07/2022)   PRAPARE - Administrator, Civil Service (Medical): No    Lack of Transportation (Non-Medical): No  Physical Activity: Sufficiently Active (03/07/2022)   Exercise Vital Sign    Days of Exercise per Week: 7 days    Minutes of Exercise per Session: 60 min  Stress: No Stress Concern Present (03/07/2022)   [employer] of Occupational Health - Occupational Stress  Questionnaire    Feeling of Stress : Not at all  Social Connections: Unknown (03/07/2022)   Social Connection and Isolation Panel [NHANES]    Frequency of Communication with Friends and Family: Not on file    Frequency of Social Gatherings with Friends and Family: Not on file    Attends Religious Services: Not on file    Active Member of Clubs or Organizations: Not on file    Attends Banker Meetings: Not on file    Marital Status: Married     Review of Systems  Constitutional:  Negative for fever.       She is eating.    HENT:  Negative for congestion and sinus pressure.   Respiratory:  Negative for cough, chest tightness and shortness of breath.   Cardiovascular:  Negative for chest pain, palpitations and leg swelling.  Gastrointestinal:  Negative for abdominal pain, diarrhea, nausea and vomiting.  Genitourinary:  Negative for difficulty urinating and dysuria.  Musculoskeletal:  Negative for joint swelling and myalgias.  Skin:  Negative for color change and rash.  Neurological:  Negative for dizziness, light-headedness and headaches.  Psychiatric/Behavioral:  Negative for agitation and dysphoric mood.        Objective:     BP 130/78 (BP Location: Left Arm, Patient Position: Sitting, Cuff Size: Small)   Pulse (!) 54   Temp 98.4 F (36.9 C) (Temporal)   Ht 5\' 5"  (1.651 m)   Wt 118 lb (53.5 kg)   SpO2 98%   BMI 19.64 kg/m  Wt Readings from Last 3 Encounters:  08/08/22 118 lb (53.5 kg)  07/15/22 116 lb (52.6 kg)  05/13/22 116 lb 9.6 oz (52.9 kg)    Physical Exam Vitals reviewed.  Constitutional:      General: She is not in acute distress.    Appearance: Normal appearance.  HENT:     Head: Normocephalic and atraumatic.     Right Ear: External ear normal.     Left Ear: External ear normal.  Eyes:     General: No scleral icterus.       Right eye: No discharge.        Left eye: No discharge.     Conjunctiva/sclera: Conjunctivae normal.  Neck:      Thyroid: No thyromegaly.  Cardiovascular:     Rate and Rhythm: Normal rate and regular rhythm.  Pulmonary:     Effort: No respiratory distress.     Breath sounds: Normal breath sounds. No wheezing.  Abdominal:     General: Bowel sounds are normal.     Palpations: Abdomen is soft.     Tenderness: There is no abdominal tenderness.  Musculoskeletal:  General: No swelling or tenderness.     Cervical back: Neck supple. No tenderness.  Lymphadenopathy:     Cervical: No cervical adenopathy.  Skin:    Findings: No erythema or rash.  Neurological:     Mental Status: She is alert.  Psychiatric:        Mood and Affect: Mood normal.        Behavior: Behavior normal.      Outpatient Encounter Medications as of 08/08/2022  Medication Sig   aspirin 81 MG chewable tablet Chew 81 mg by mouth daily.   blood glucose meter kit and supplies KIT Dispense based on patient and insurance preference. Use up to four times daily as directed.   Coenzyme Q10 (CO Q 10 PO) Take 1 tablet by mouth daily at 6 (six) AM.   Cyanocobalamin (VITAMIN B12 PO) Take 2 tablets by mouth daily at 6 (six) AM. gummies   eszopiclone (LUNESTA) 2 MG TABS tablet TAKE ONE TABLET BY MOUTH ONE TIME DAILY AT BEDTIME AS NEEDED FOR SLEEP (TAKE IMMEDIATELLY BEFORE BED)   glucose blood (FREESTYLE LITE) test strip Use to check blood sugars three times daily. E11.65   Insulin Pen Needle (PEN NEEDLES) 33G X 4 MM MISC Use to inject Ozempic   levothyroxine (SYNTHROID) 50 MCG tablet TAKE 1 TABLET DAILY   losartan (COZAAR) 25 MG tablet TAKE 1 TABLET DAILY   MAGNESIUM PO Take 1 tablet by mouth daily at 6 (six) AM.   metFORMIN (GLUCOPHAGE-XR) 500 MG 24 hr tablet Take two tablets (1000 mg) by mouth in the morning and two tablets by mouth (1000 mg) every evening.   metoprolol succinate (TOPROL-XL) 25 MG 24 hr tablet TAKE 1 TABLET IN THE MORNING AND ONE-HALF TABLET (12.5 MG) AT BEDTIME   Multiple Vitamin (MULTIVITAMIN) tablet Take 1 tablet by  mouth daily.   mupirocin ointment (BACTROBAN) 2 % Apply 1 Application topically 2 (two) times daily.   Omega-3 Fatty Acids (FISH OIL PO) Take 2 tablets by mouth as needed.   ondansetron (ZOFRAN-ODT) 4 MG disintegrating tablet Take 1 tablet (4 mg total) by mouth every 8 (eight) hours as needed for nausea or vomiting.   polycarbophil (FIBERCON) 625 MG tablet Take 625 mg by mouth daily.   Probiotic Product (PROBIOTIC DAILY PO) Take 1 tablet by mouth daily at 6 (six) AM.   rosuvastatin (CRESTOR) 20 MG tablet TAKE 1 TABLET DAILY   [DISCONTINUED] OZEMPIC, 0.25 OR 0.5 MG/DOSE, 2 MG/3ML SOPN Inject into the skin.   OZEMPIC, 0.25 OR 0.5 MG/DOSE, 2 MG/3ML SOPN Inject 0.25 mg into the skin once a week.   No facility-administered encounter medications on file as of 08/08/2022.     Lab Results  Component Value Date   WBC 4.1 01/10/2022   HGB 12.6 01/10/2022   HCT 36.8 01/10/2022   PLT 270.0 01/10/2022   GLUCOSE 110 (H) 08/05/2022   CHOL 136 08/05/2022   TRIG 78.0 08/05/2022   HDL 77.90 08/05/2022   LDLCALC 42 08/05/2022   ALT 18 08/05/2022   AST 24 08/05/2022   NA 136 08/05/2022   K 4.5 08/05/2022   CL 99 08/05/2022   CREATININE 0.83 08/05/2022   BUN 9 08/05/2022   CO2 31 08/05/2022   TSH 1.02 01/10/2022   HGBA1C 6.9 (H) 08/05/2022   MICROALBUR <0.7 01/10/2022    [place] OR NERVE BLOCK-IMAGE ONLY [facility])  Result Date: 12/24/2021 There is no interpretation for this exam.  This order is for images obtained during a surgical procedure.  Please See "Surgeries" Tab for more information regarding the procedure.       Assessment & Plan:   Problem List Items Addressed This Visit     Colon cancer screening    Agreeable to cologuard.  Ordered.       Difficulty sleeping    Continues on lunesta.       Hypercholesterolemia    Continue crestor.  Follow lipid panel and liver function tests.        Hypertension, essential    On losartan.  Follow pressures.  Follow metabolic panel.        Hypothyroidism    On thyroid replacement.  Follow tsh.       Raynaud's disease    With the cold weather, having more issues.  Discussed treatment.  Discussed amlodipine.  After reviewing, she has tried amlodipine in the past and reported intolerance - headaches and fatigue.  Keep hands warm.  Follow.       Stress    Increased stress.  Discussed.  Does not feel needs any further intervention at this time.  No SI.  Follow.       Type 2 diabetes mellitus with hyperglycemia (HCC)    On metformin - now 1500mg  per day.  Off ozempic.  Not taking jardiance. Discussed.  Wants to go back on ozempic.  Will start .25mg .  Follow closely.  Hold on making changes in metformin dose at this time.  Follow met b and a1c.       Relevant Medications   OZEMPIC, 0.25 OR 0.5 MG/DOSE, 2 MG/3ML SOPN   Other Visit Diagnoses     Screening for colon cancer    -  Primary   Relevant Orders   Cologuard       Einar Pheasant, MD

## 2022-08-09 ENCOUNTER — Telehealth: Payer: Self-pay | Admitting: Internal Medicine

## 2022-08-09 ENCOUNTER — Encounter: Payer: Self-pay | Admitting: Internal Medicine

## 2022-08-09 NOTE — Assessment & Plan Note (Signed)
Agreeable to cologuard.  Ordered.

## 2022-08-09 NOTE — Assessment & Plan Note (Signed)
Continue crestor.  Follow lipid panel and liver function tests.  

## 2022-08-09 NOTE — Assessment & Plan Note (Signed)
Increased stress.  Discussed.  Does not feel needs any further intervention at this time.  No SI.  Follow.

## 2022-08-09 NOTE — Assessment & Plan Note (Signed)
Continues on lunesta.

## 2022-08-09 NOTE — Assessment & Plan Note (Signed)
On thyroid replacement.  Follow tsh.  

## 2022-08-09 NOTE — Assessment & Plan Note (Signed)
On losartan.  Follow pressures.  Follow metabolic panel.  

## 2022-08-09 NOTE — Telephone Encounter (Signed)
Please call and notify Donna Howell that I reviewed her chart and we did try amlodipine previously and she reported intolerance to the medication.  Reported headache and fatigue.  Continue to keep hands warm.  We will follow.

## 2022-08-09 NOTE — Assessment & Plan Note (Signed)
With the cold weather, having more issues.  Discussed treatment.  Discussed amlodipine.  After reviewing, she has tried amlodipine in the past and reported intolerance - headaches and fatigue.  Keep hands warm.  Follow.

## 2022-08-10 ENCOUNTER — Other Ambulatory Visit: Payer: Self-pay | Admitting: Internal Medicine

## 2022-08-10 DIAGNOSIS — E119 Type 2 diabetes mellitus without complications: Secondary | ICD-10-CM

## 2022-08-11 ENCOUNTER — Other Ambulatory Visit: Payer: Self-pay

## 2022-08-11 DIAGNOSIS — E119 Type 2 diabetes mellitus without complications: Secondary | ICD-10-CM

## 2022-08-11 MED ORDER — FREESTYLE LITE TEST VI STRP: ORAL_STRIP | 3 refills | Status: DC

## 2022-08-11 MED ORDER — OZEMPIC (0.25 OR 0.5 MG/DOSE) 2 MG/3ML ~~LOC~~ SOPN: 0.2500 mg | PEN_INJECTOR | SUBCUTANEOUS | 2 refills | Status: DC

## 2022-08-11 NOTE — Telephone Encounter (Signed)
Pt advised Stated she is willing to try again, stated this year her hands are worse then ever. If she takes her dogs out, her hands are white. Goes through gloves, and cannot hold hand warmers and leashes at the same time.  Willing to give amlodipine another try if it is ok with you.

## 2022-08-12 ENCOUNTER — Other Ambulatory Visit: Payer: Self-pay

## 2022-08-12 MED ORDER — AMLODIPINE BESYLATE 2.5 MG PO TABS: 2.5000 mg | ORAL_TABLET | Freq: Every day | ORAL | 2 refills | Status: DC

## 2022-08-12 MED ORDER — OZEMPIC (0.25 OR 0.5 MG/DOSE) 2 MG/3ML ~~LOC~~ SOPN: 0.2500 mg | PEN_INJECTOR | SUBCUTANEOUS | 2 refills | Status: DC

## 2022-08-12 NOTE — Telephone Encounter (Signed)
Notify - ok to start amlodipine 2.5mg  q day.  Will need rx sent in.  She is also on losartan 25mg  q day.  This is a low dose.  Have her follow blood pressures when starts.  May need to adjust medication.

## 2022-08-12 NOTE — Telephone Encounter (Signed)
Pt advised Rx sent to [facility] per pt

## 2022-09-05 ENCOUNTER — Encounter: Payer: Self-pay | Admitting: Internal Medicine

## 2022-09-08 ENCOUNTER — Other Ambulatory Visit: Payer: Self-pay

## 2022-09-09 ENCOUNTER — Other Ambulatory Visit: Payer: Self-pay

## 2022-09-09 DIAGNOSIS — E1165 Type 2 diabetes mellitus with hyperglycemia: Secondary | ICD-10-CM

## 2022-09-09 MED ORDER — OZEMPIC (0.25 OR 0.5 MG/DOSE) 2 MG/3ML ~~LOC~~ SOPN: 0.2500 mg | PEN_INJECTOR | SUBCUTANEOUS | 0 refills | Status: DC

## 2022-09-09 MED ORDER — PEN NEEDLES 33G X 4 MM MISC: 3 refills | Status: DC

## 2022-09-09 NOTE — Telephone Encounter (Signed)
Noted.  Remain off for no. Continue to monitor blood pressure.

## 2022-09-26 ENCOUNTER — Ambulatory Visit (INDEPENDENT_AMBULATORY_CARE_PROVIDER_SITE_OTHER): Payer: Medicare Other | Admitting: Internal Medicine

## 2022-09-26 ENCOUNTER — Encounter: Payer: Self-pay | Admitting: Internal Medicine

## 2022-09-26 VITALS — BP 124/76 | HR 63 | Temp 98.1°F | Resp 16 | Ht 64.0 in | Wt 118.6 lb

## 2022-09-26 DIAGNOSIS — E039 Hypothyroidism, unspecified: Secondary | ICD-10-CM

## 2022-09-26 DIAGNOSIS — I10 Essential (primary) hypertension: Secondary | ICD-10-CM | POA: Diagnosis not present

## 2022-09-26 DIAGNOSIS — J3489 Other specified disorders of nose and nasal sinuses: Secondary | ICD-10-CM | POA: Diagnosis not present

## 2022-09-26 DIAGNOSIS — E1165 Type 2 diabetes mellitus with hyperglycemia: Secondary | ICD-10-CM

## 2022-09-26 DIAGNOSIS — F439 Reaction to severe stress, unspecified: Secondary | ICD-10-CM

## 2022-09-26 DIAGNOSIS — E7800 Pure hypercholesterolemia, unspecified: Secondary | ICD-10-CM

## 2022-09-26 DIAGNOSIS — I7300 Raynaud's syndrome without gangrene: Secondary | ICD-10-CM | POA: Diagnosis not present

## 2022-09-26 DIAGNOSIS — R0989 Other specified symptoms and signs involving the circulatory and respiratory systems: Secondary | ICD-10-CM

## 2022-09-26 DIAGNOSIS — Z9109 Other allergy status, other than to drugs and biological substances: Secondary | ICD-10-CM

## 2022-09-26 MED ORDER — MUPIROCIN 2 % EX OINT: 1.0000 | TOPICAL_OINTMENT | Freq: Two times a day (BID) | CUTANEOUS | 0 refills | Status: DC

## 2022-09-26 MED ORDER — AZELASTINE HCL 0.1 % NA SOLN: 1.0000 | Freq: Two times a day (BID) | NASAL | 1 refills | Status: DC

## 2022-09-26 NOTE — Progress Notes (Unsigned)
Subjective:    Patient ID: Donna Howell, female    DOB: 1951/05/22, 72 y.o.   MRN: [id number]  Patient here for  Chief Complaint  Patient presents with   Medical Management of Chronic Issues    HPI Here to follow up regarding hypertension and diabetes.  Recently started on amlodipine for raynauds.  Had intolerance to amlodipine.  Off now. Increased stress.   Is s/p extensive arthroscopic debridement, subacromial decompression, and arthroscopic subscapularis tendon repair with biceps tenolysis of the right shoulder. Being followed by ortho.  Cologuard.   Past Medical History:  Diagnosis Date   Anemia    Arthritis    Chicken pox    Diabetes (HCC)    Family history of adverse reaction to anesthesia    daughter coded during surgery and son had trouble breathing during surgery   Frequent headaches    GERD (gastroesophageal reflux disease)    Hay fever    Heart murmur    High blood pressure    History of bronchitis    Hypothyroidism    IBS (irritable bowel syndrome)    Liver cyst    Mitral valve regurgitation    Tricuspid regurgitation    Past Surgical History:  Procedure Laterality Date   APPENDECTOMY  1974   BREAST BIOPSY Left 01/16/2021   Affirm bx-"Ribbon" clip-path pending   CERVICAL CERCLAGE     DIAGNOSTIC LAPAROSCOPY     SHOULDER ARTHROSCOPY WITH SUBACROMIAL DECOMPRESSION, ROTATOR CUFF REPAIR AND BICEP TENDON REPAIR Right 12/24/2021   Procedure: RIGHT SHOULDER ARTHROSCOPY WITH DEBRIDEMENT, DECOMPRESSION, ROTATOR CUFF RELEASE, AND BICEPS TENODESIS;  Surgeon: Corky Mull, MD;  Location: [facility] OR[facility];  Service: Orthopedics;  Laterality: Right;   TONSILLECTOMY AND ADENOIDECTOMY  1962   Family History  Problem Relation Age of Onset   Arthritis Mother    Heart disease Mother    Diabetes Mother    Stroke Father    Arthritis Sister    Breast cancer Sister 62   Alcoholism Brother    Arthritis Maternal Grandmother    Heart disease Maternal Grandmother    Diabetes  Maternal Grandmother    Social History   Socioeconomic History   Marital status: Married    Spouse name: Not on file   Number of children: Not on file   Years of education: Not on file   Highest education level: Not on file  Occupational History   Not on file  Tobacco Use   Smoking status: Never   Smokeless tobacco: Never  Vaping Use   Vaping Use: Never used  Substance and Sexual Activity   Alcohol use: No   Drug use: No   Sexual activity: Not on file  Other Topics Concern   Not on file  Social History Narrative   Not on file   Social Determinants of Health   Financial Resource Strain: Low Risk  (03/07/2022)   Overall Financial Resource Strain (CARDIA)    Difficulty of Paying Living Expenses: Not hard at all  Food Insecurity: No Food Insecurity (03/07/2022)   Hunger Vital Sign    Worried About Running Out of Food in the Last Year: Never true    Ran Out of Food in the Last Year: Never true  Transportation Needs: No Transportation Needs (03/07/2022)   PRAPARE - Hydrologist (Medical): No    Lack of Transportation (Non-Medical): No  Physical Activity: Sufficiently Active (03/07/2022)   Exercise Vital Sign    Days of Exercise per Week:  7 days    Minutes of Exercise per Session: 60 min  Stress: No Stress Concern Present (03/07/2022)   [place]    Feeling of Stress : Not at all  Social Connections: Unknown (03/07/2022)   Social Connection and Isolation Panel [NHANES]    Frequency of Communication with Friends and Family: Not on file    Frequency of Social Gatherings with Friends and Family: Not on file    Attends Religious Services: Not on file    Active Member of Clubs or Organizations: Not on file    Attends Archivist Meetings: Not on file    Marital Status: Married     Review of Systems     Objective:     BP 124/76   Pulse 63   Temp 98.1 F (36.7 C)    Resp 16   Ht '5\' 4"'$  (1.626 m)   Wt 118 lb 9.6 oz (53.8 kg)   SpO2 98%   BMI 20.36 kg/m  Wt Readings from Last 3 Encounters:  09/26/22 118 lb 9.6 oz (53.8 kg)  08/08/22 118 lb (53.5 kg)  07/15/22 116 lb (52.6 kg)    Physical Exam   Outpatient Encounter Medications as of 09/26/2022  Medication Sig   aspirin 81 MG chewable tablet Chew 81 mg by mouth daily.   blood glucose meter kit and supplies KIT Dispense based on patient and insurance preference. Use up to four times daily as directed.   Coenzyme Q10 (CO Q 10 PO) Take 1 tablet by mouth daily at 6 (six) AM.   Cyanocobalamin (VITAMIN B12 PO) Take 2 tablets by mouth daily at 6 (six) AM. gummies   eszopiclone (LUNESTA) 2 MG TABS tablet TAKE ONE TABLET BY MOUTH ONE TIME DAILY AT BEDTIME AS NEEDED FOR SLEEP (TAKE IMMEDIATELLY BEFORE BED)   glucose blood (FREESTYLE LITE) test strip Use to check blood sugars three times daily. E11.65   Insulin Pen Needle (PEN NEEDLES) 33G X 4 MM MISC Use to inject Ozempic   levothyroxine (SYNTHROID) 50 MCG tablet TAKE 1 TABLET DAILY   losartan (COZAAR) 25 MG tablet TAKE 1 TABLET DAILY   MAGNESIUM PO Take 1 tablet by mouth daily at 6 (six) AM.   metFORMIN (GLUCOPHAGE-XR) 500 MG 24 hr tablet Take two tablets (1000 mg) by mouth in the morning and two tablets by mouth (1000 mg) every evening.   metoprolol succinate (TOPROL-XL) 25 MG 24 hr tablet TAKE 1 TABLET IN THE MORNING AND ONE-HALF TABLET (12.5 MG) AT BEDTIME   Multiple Vitamin (MULTIVITAMIN) tablet Take 1 tablet by mouth daily.   mupirocin ointment (BACTROBAN) 2 % Apply 1 Application topically 2 (two) times daily.   Omega-3 Fatty Acids (FISH OIL PO) Take 2 tablets by mouth as needed.   ondansetron (ZOFRAN-ODT) 4 MG disintegrating tablet Take 1 tablet (4 mg total) by mouth every 8 (eight) hours as needed for nausea or vomiting.   OZEMPIC, 0.25 OR 0.5 MG/DOSE, 2 MG/3ML SOPN Inject 0.25 mg into the skin once a week.   polycarbophil (FIBERCON) 625 MG tablet Take  625 mg by mouth daily.   Probiotic Product (PROBIOTIC DAILY PO) Take 1 tablet by mouth daily at 6 (six) AM.   rosuvastatin (CRESTOR) 20 MG tablet TAKE 1 TABLET DAILY   [DISCONTINUED] amLODipine (NORVASC) 2.5 MG tablet Take 1 tablet (2.5 mg total) by mouth daily.   No facility-administered encounter medications on file as of 09/26/2022.     Lab  Results  Component Value Date   WBC 4.1 01/10/2022   HGB 12.6 01/10/2022   HCT 36.8 01/10/2022   PLT 270.0 01/10/2022   GLUCOSE 110 (H) 08/05/2022   CHOL 136 08/05/2022   TRIG 78.0 08/05/2022   HDL 77.90 08/05/2022   LDLCALC 42 08/05/2022   ALT 18 08/05/2022   AST 24 08/05/2022   NA 136 08/05/2022   K 4.5 08/05/2022   CL 99 08/05/2022   CREATININE 0.83 08/05/2022   BUN 9 08/05/2022   CO2 31 08/05/2022   TSH 1.02 01/10/2022   HGBA1C 6.9 (H) 08/05/2022   MICROALBUR <0.7 01/10/2022    No results found.     Assessment & Plan:  Hypertension, essential  Type 2 diabetes mellitus with hyperglycemia, without long-term current use of insulin (HCC)  Hypercholesterolemia     Einar Pheasant, MD

## 2022-09-28 ENCOUNTER — Encounter: Payer: Self-pay | Admitting: Internal Medicine

## 2022-09-28 DIAGNOSIS — R0989 Other specified symptoms and signs involving the circulatory and respiratory systems: Secondary | ICD-10-CM | POA: Insufficient documentation

## 2022-09-28 DIAGNOSIS — J3489 Other specified disorders of nose and nasal sinuses: Secondary | ICD-10-CM | POA: Insufficient documentation

## 2022-09-28 NOTE — Assessment & Plan Note (Signed)
Continue crestor.  Follow lipid panel and liver function tests.  

## 2022-09-28 NOTE — Assessment & Plan Note (Signed)
With persistent nasal dripping.  Trial of astelin nasal spray.  Follow.

## 2022-09-28 NOTE — Assessment & Plan Note (Signed)
On losartan.  Follow pressures.  Follow metabolic panel.

## 2022-09-28 NOTE — Assessment & Plan Note (Signed)
With the nasal dripping, etc - trial of astelin as outlined.  Follow. Notify if persistent.  If persistent issues with hoarseness, etc - will have ENT evaluated.

## 2022-09-28 NOTE — Assessment & Plan Note (Signed)
On thyroid replacement.  Follow tsh.  

## 2022-09-28 NOTE — Assessment & Plan Note (Signed)
On metformin - now '1500mg'$  per day.  Back on ozempic .'25mg'$ .  Follow closely.  Weight stable.  Follow met b and a1c.

## 2022-09-28 NOTE — Assessment & Plan Note (Signed)
Tried amlodipine.  Did not tolerate.  Follow.  Hold on any further intervention at this time.

## 2022-09-28 NOTE — Assessment & Plan Note (Signed)
Increased stress as outlined.  Discussed.  Does not feel needs any further intervention at this time.  Follow.  

## 2022-09-28 NOTE — Assessment & Plan Note (Signed)
Bactroban as directed.  Follow.  

## 2022-10-06 ENCOUNTER — Encounter: Payer: Self-pay | Admitting: Internal Medicine

## 2022-10-06 NOTE — Progress Notes (Unsigned)
Donna Morrow, NP-C Phone: [phone]  Donna Howell is a 73 y.o. female who presents today for congestion.   Patient reports testing positive for COVID in December and having lingering problems with congestion since that time. She reports increased drainage in her throat causing irritation. She was started on a trial of nasal spray for this on 09/26/2022. She reports worsening symptoms for the last 2 weeks, continued congestion with increased sinus pressure and tenderness.   Respiratory illness:  Cough- Yes, mild  Congestion-    Sinus- Yes, nasal, pressure/fullness   Chest- No  Post nasal drip- Yes  Sore throat- Not sore, irritated/scratchy  Shortness of breath- No  Fever- No  Fatigue/Myalgia- No Headache- Yes Nausea/Vomiting- No Taste disturbance- No  Smell disturbance- No  Covid exposure- No  Covid vaccination- Reports UTD  Flu vaccination- Reports UTD  Medications- Nasal spray and cough drops   Social History   Tobacco Use  Smoking Status Never  Smokeless Tobacco Never    Current Outpatient Medications on File Prior to Visit  Medication Sig Dispense Refill   aspirin 81 MG chewable tablet Chew 81 mg by mouth daily.     azelastine (ASTELIN) 0.1 % nasal spray Place 1 spray into both nostrils 2 (two) times daily. Use in each nostril as directed 30 mL 1   blood glucose meter kit and supplies KIT Dispense based on patient and insurance preference. Use up to four times daily as directed. 1 each 3   Coenzyme Q10 (CO Q 10 PO) Take 1 tablet by mouth daily at 6 (six) AM.     Cyanocobalamin (VITAMIN B12 PO) Take 2 tablets by mouth daily at 6 (six) AM. gummies     eszopiclone (LUNESTA) 2 MG TABS tablet TAKE ONE TABLET BY MOUTH ONE TIME DAILY AT BEDTIME AS NEEDED FOR SLEEP (TAKE IMMEDIATELLY BEFORE BED) 30 tablet 2   glucose blood (FREESTYLE LITE) test strip Use to check blood sugars three times daily. E11.65 100 strip 3   Insulin Pen Needle (PEN NEEDLES) 33G X 4 MM MISC Use to  inject Ozempic 100 each 3   levothyroxine (SYNTHROID) 50 MCG tablet TAKE 1 TABLET DAILY 90 tablet 3   losartan (COZAAR) 25 MG tablet TAKE 1 TABLET DAILY 90 tablet 3   MAGNESIUM PO Take 1 tablet by mouth daily at 6 (six) AM.     metFORMIN (GLUCOPHAGE-XR) 500 MG 24 hr tablet Take two tablets (1000 mg) by mouth in the morning and two tablets by mouth (1000 mg) every evening. 120 tablet 3   metoprolol succinate (TOPROL-XL) 25 MG 24 hr tablet TAKE 1 TABLET IN THE MORNING AND ONE-HALF TABLET (12.5 MG) AT BEDTIME 135 tablet 3   Multiple Vitamin (MULTIVITAMIN) tablet Take 1 tablet by mouth daily.     mupirocin ointment (BACTROBAN) 2 % Apply 1 Application topically 2 (two) times daily. 22 g 0   Omega-3 Fatty Acids (FISH OIL PO) Take 2 tablets by mouth as needed.     ondansetron (ZOFRAN-ODT) 4 MG disintegrating tablet Take 1 tablet (4 mg total) by mouth every 8 (eight) hours as needed for nausea or vomiting. (Patient not taking: Reported on 10/07/2022) 20 tablet 1   OZEMPIC, 0.25 OR 0.5 MG/DOSE, 2 MG/3ML SOPN Inject 0.25 mg into the skin once a week. 5 mL 0   polycarbophil (FIBERCON) 625 MG tablet Take 625 mg by mouth daily.     Probiotic Product (PROBIOTIC DAILY PO) Take 1 tablet by mouth daily at 6 (six) AM.  rosuvastatin (CRESTOR) 20 MG tablet TAKE 1 TABLET DAILY 90 tablet 3   No current facility-administered medications on file prior to visit.    ROS see history of present illness  Objective  Physical Exam Vitals:   10/07/22 1501  BP: 118/80  Pulse: (!) 59  Temp: 97.8 F (36.6 C)  SpO2: 99%    BP Readings from Last 3 Encounters:  10/07/22 118/80  09/26/22 124/76  08/08/22 130/78   Wt Readings from Last 3 Encounters:  10/07/22 117 lb 6.4 oz (53.3 kg)  09/26/22 118 lb 9.6 oz (53.8 kg)  08/08/22 118 lb (53.5 kg)    Physical Exam Constitutional:      General: She is not in acute distress.    Appearance: Normal appearance.  HENT:     Head: Normocephalic.     Right Ear:  Tympanic membrane normal.     Left Ear: Tympanic membrane normal.     Nose: Congestion present.     Right Sinus: Maxillary sinus tenderness present.     Left Sinus: Maxillary sinus tenderness present.     Mouth/Throat:     Mouth: Mucous membranes are moist.     Pharynx: Oropharynx is clear.  Eyes:     Conjunctiva/sclera: Conjunctivae normal.     Pupils: Pupils are equal, round, and reactive to light.  Cardiovascular:     Rate and Rhythm: Normal rate and regular rhythm.     Heart sounds: Normal heart sounds.  Pulmonary:     Effort: Pulmonary effort is normal.     Breath sounds: Normal breath sounds.  Lymphadenopathy:     Cervical: No cervical adenopathy.  Skin:    General: Skin is warm and dry.  Neurological:     General: No focal deficit present.     Mental Status: She is alert.  Psychiatric:        Mood and Affect: Mood normal.        Behavior: Behavior normal.    Assessment/Plan: Please see individual problem list.  Acute non-recurrent maxillary sinusitis Assessment & Plan: Symptoms consistent with sinusitis. Patient has been trying nasal spray for 10 days without relief and with worsening symptoms. Will treat with Doxy 100 BID x 7 days. Encouraged patient to continue nasal spray and remain adequately hydrated. Patient reports long history of allergy problems, recommended she add a daily antihistamine to see if it helps as well. Encouraged to return if symptoms do not improve or are changing/worsening.   Orders: -     Doxycycline Hyclate; Take 1 tablet (100 mg total) by mouth 2 (two) times daily.  Dispense: 14 tablet; Refill: 0   Return if symptoms worsen or fail to improve.   Donna Morrow, NP-C [place]

## 2022-10-07 ENCOUNTER — Ambulatory Visit (INDEPENDENT_AMBULATORY_CARE_PROVIDER_SITE_OTHER): Payer: Medicare Other | Admitting: Nurse Practitioner

## 2022-10-07 ENCOUNTER — Encounter: Payer: Self-pay | Admitting: Nurse Practitioner

## 2022-10-07 VITALS — BP 118/80 | HR 59 | Temp 97.8°F | Ht 64.0 in | Wt 117.4 lb

## 2022-10-07 DIAGNOSIS — J0100 Acute maxillary sinusitis, unspecified: Secondary | ICD-10-CM | POA: Diagnosis not present

## 2022-10-07 MED ORDER — DOXYCYCLINE HYCLATE 100 MG PO TABS: 100.0000 mg | ORAL_TABLET | Freq: Two times a day (BID) | ORAL | 0 refills | Status: DC

## 2022-10-07 NOTE — Assessment & Plan Note (Signed)
Symptoms consistent with sinusitis. Patient has been trying nasal spray for 10 days without relief and with worsening symptoms. Will treat with Doxy 100 BID x 7 days. Encouraged patient to continue nasal spray and remain adequately hydrated. Patient reports long history of allergy problems, recommended she add a daily antihistamine to see if it helps as well. Encouraged to return if symptoms do not improve or are changing/worsening.

## 2022-10-16 ENCOUNTER — Encounter: Payer: Self-pay | Admitting: Nurse Practitioner

## 2022-10-20 DIAGNOSIS — R490 Dysphonia: Secondary | ICD-10-CM | POA: Diagnosis not present

## 2022-10-20 DIAGNOSIS — J302 Other seasonal allergic rhinitis: Secondary | ICD-10-CM | POA: Diagnosis not present

## 2022-11-03 ENCOUNTER — Other Ambulatory Visit: Payer: Self-pay | Admitting: Internal Medicine

## 2022-11-03 NOTE — Telephone Encounter (Signed)
LOV: 09/26/2022   NOV: 11/21/22

## 2022-11-10 ENCOUNTER — Other Ambulatory Visit: Payer: Self-pay | Admitting: Internal Medicine

## 2022-11-10 DIAGNOSIS — E119 Type 2 diabetes mellitus without complications: Secondary | ICD-10-CM

## 2022-11-18 ENCOUNTER — Other Ambulatory Visit (INDEPENDENT_AMBULATORY_CARE_PROVIDER_SITE_OTHER): Payer: Medicare Other

## 2022-11-18 DIAGNOSIS — E039 Hypothyroidism, unspecified: Secondary | ICD-10-CM | POA: Diagnosis not present

## 2022-11-18 DIAGNOSIS — I10 Essential (primary) hypertension: Secondary | ICD-10-CM

## 2022-11-18 DIAGNOSIS — E7800 Pure hypercholesterolemia, unspecified: Secondary | ICD-10-CM

## 2022-11-18 DIAGNOSIS — E1165 Type 2 diabetes mellitus with hyperglycemia: Secondary | ICD-10-CM

## 2022-11-18 LAB — BASIC METABOLIC PANEL
BUN: 8 mg/dL (ref 6–23)
CO2: 31 mEq/L (ref 19–32)
Calcium: 9.8 mg/dL (ref 8.4–10.5)
Chloride: 97 mEq/L (ref 96–112)
Creatinine, Ser: 0.81 mg/dL (ref 0.40–1.20)
GFR: 73.06 mL/min (ref >60.00)
Glucose, Bld: 107 mg/dL — ABNORMAL HIGH (ref 70–99)
Potassium: 4.6 mEq/L (ref 3.5–5.1)
Sodium: 134 mEq/L — ABNORMAL LOW (ref 135–145)

## 2022-11-18 LAB — TSH: TSH: 0.9 u[IU]/mL (ref 0.35–5.50)

## 2022-11-18 LAB — LIPID PANEL
Cholesterol: 129 mg/dL (ref 0–200)
HDL: 77.2 mg/dL (ref >39.00)
LDL Cholesterol: 41 mg/dL (ref 0–99)
NonHDL: 51.36
Total CHOL/HDL Ratio: 2
Triglycerides: 52 mg/dL (ref 0.0–149.0)
VLDL: 10.4 mg/dL (ref 0.0–40.0)

## 2022-11-18 LAB — MICROALBUMIN / CREATININE URINE RATIO
Creatinine,U: 50.9 mg/dL
Microalb Creat Ratio: 1.4 mg/g (ref 0.0–30.0)
Microalb, Ur: <0.7 mg/dL (ref 0.0–1.9)

## 2022-11-18 LAB — HEPATIC FUNCTION PANEL
ALT: 19 U/L (ref 0–35)
AST: 27 U/L (ref 0–37)
Albumin: 4.6 g/dL (ref 3.5–5.2)
Alkaline Phosphatase: 53 U/L (ref 39–117)
Bilirubin, Direct: 0.2 mg/dL (ref 0.0–0.3)
Total Bilirubin: 0.6 mg/dL (ref 0.2–1.2)
Total Protein: 6.8 g/dL (ref 6.0–8.3)

## 2022-11-18 LAB — HEMOGLOBIN A1C: Hgb A1c MFr Bld: 6.6 % — ABNORMAL HIGH (ref 4.6–6.5)

## 2022-11-21 ENCOUNTER — Ambulatory Visit: Payer: Medicare Other | Admitting: Internal Medicine

## 2022-11-24 ENCOUNTER — Ambulatory Visit: Payer: Medicare Other | Admitting: Internal Medicine

## 2022-11-27 ENCOUNTER — Encounter: Payer: Self-pay | Admitting: Internal Medicine

## 2022-11-27 ENCOUNTER — Ambulatory Visit (INDEPENDENT_AMBULATORY_CARE_PROVIDER_SITE_OTHER): Payer: Medicare Other | Admitting: Internal Medicine

## 2022-11-27 VITALS — BP 104/60 | HR 65 | Temp 97.9°F | Resp 16 | Ht 64.0 in | Wt 114.8 lb

## 2022-11-27 DIAGNOSIS — K219 Gastro-esophageal reflux disease without esophagitis: Secondary | ICD-10-CM

## 2022-11-27 DIAGNOSIS — R490 Dysphonia: Secondary | ICD-10-CM | POA: Diagnosis not present

## 2022-11-27 DIAGNOSIS — F439 Reaction to severe stress, unspecified: Secondary | ICD-10-CM

## 2022-11-27 DIAGNOSIS — I10 Essential (primary) hypertension: Secondary | ICD-10-CM

## 2022-11-27 DIAGNOSIS — E039 Hypothyroidism, unspecified: Secondary | ICD-10-CM

## 2022-11-27 DIAGNOSIS — G479 Sleep disorder, unspecified: Secondary | ICD-10-CM

## 2022-11-27 DIAGNOSIS — E119 Type 2 diabetes mellitus without complications: Secondary | ICD-10-CM

## 2022-11-27 DIAGNOSIS — E1165 Type 2 diabetes mellitus with hyperglycemia: Secondary | ICD-10-CM | POA: Diagnosis not present

## 2022-11-27 DIAGNOSIS — E7800 Pure hypercholesterolemia, unspecified: Secondary | ICD-10-CM

## 2022-11-27 MED ORDER — METFORMIN HCL ER 500 MG PO TB24: 1000.0000 mg | ORAL_TABLET | Freq: Two times a day (BID) | ORAL | 3 refills | Status: DC

## 2022-11-27 MED ORDER — ROSUVASTATIN CALCIUM 20 MG PO TABS: 20.0000 mg | ORAL_TABLET | Freq: Every day | ORAL | 3 refills | Status: DC

## 2022-11-27 MED ORDER — LEVOTHYROXINE SODIUM 50 MCG PO TABS: 50.0000 ug | ORAL_TABLET | Freq: Every day | ORAL | 3 refills | Status: DC

## 2022-11-27 MED ORDER — LOSARTAN POTASSIUM 25 MG PO TABS: 25.0000 mg | ORAL_TABLET | Freq: Every day | ORAL | 3 refills | Status: DC

## 2022-11-27 MED ORDER — METOPROLOL SUCCINATE ER 25 MG PO TB24: 25.0000 mg | ORAL_TABLET | Freq: Two times a day (BID) | ORAL | 3 refills | Status: DC

## 2022-11-27 NOTE — Patient Instructions (Signed)
Pepcid (famotidine) - 20mg 

## 2022-11-27 NOTE — Progress Notes (Signed)
Subjective:    Patient ID: Donna Howell, female    DOB: 10-27-1950, 72 y.o.   MRN: [id number]  Patient here for  Chief Complaint  Patient presents with   Medical Management of Chronic Issues    HPI Here to follow up regarding hypertension and diabetes. Also increased stress.  Discussed.  Overall she feels she is handling things relatively well.  Does not feel needs further intervention.  No chest pain or sob reported.  Exercising regularly.  Some occasional acid reflux.  Relates to certain foods she eats.  If she avoids these foods (for example - pizza) - no problems.  No abdominal pain.  On metformin and ozempic.  Tolerating relatively well. Discussed labs.  A1c 6.6 - improved.  Hold on making changes.  Saw ENT for hoarseness.  Recommended speech therapy.    Past Medical History:  Diagnosis Date   Anemia    Arthritis    Chicken pox    Diabetes (HCC)    Family history of adverse reaction to anesthesia    daughter coded during surgery and son had trouble breathing during surgery   Frequent headaches    GERD (gastroesophageal reflux disease)    Hay fever    Heart murmur    High blood pressure    History of bronchitis    Hypothyroidism    IBS (irritable bowel syndrome)    Liver cyst    Mitral valve regurgitation    Tricuspid regurgitation    Past Surgical History:  Procedure Laterality Date   APPENDECTOMY  1974   BREAST BIOPSY Left 01/16/2021   Affirm bx-"Ribbon" clip-path pending   CERVICAL CERCLAGE     DIAGNOSTIC LAPAROSCOPY     SHOULDER ARTHROSCOPY WITH SUBACROMIAL DECOMPRESSION, ROTATOR CUFF REPAIR AND BICEP TENDON REPAIR Right 12/24/2021   Procedure: RIGHT SHOULDER ARTHROSCOPY WITH DEBRIDEMENT, DECOMPRESSION, ROTATOR CUFF RELEASE, AND BICEPS TENODESIS;  Surgeon: Christena Flake, MD;  Location: [facility] ORS;  Service: Orthopedics;  Laterality: Right;   TONSILLECTOMY AND ADENOIDECTOMY  1962   Family History  Problem Relation Age of Onset   Arthritis Mother    Heart  disease Mother    Diabetes Mother    Stroke Father    Arthritis Sister    Breast cancer Sister 19   Alcoholism Brother    Arthritis Maternal Grandmother    Heart disease Maternal Grandmother    Diabetes Maternal Grandmother    Social History   Socioeconomic History   Marital status: Married    Spouse name: Not on file   Number of children: Not on file   Years of education: Not on file   Highest education level: Not on file  Occupational History   Not on file  Tobacco Use   Smoking status: Never   Smokeless tobacco: Never  Vaping Use   Vaping Use: Never used  Substance and Sexual Activity   Alcohol use: No   Drug use: No   Sexual activity: Not on file  Other Topics Concern   Not on file  Social History Narrative   Not on file   Social Determinants of Health   Financial Resource Strain: Low Risk  (03/07/2022)   Overall Financial Resource Strain (CARDIA)    Difficulty of Paying Living Expenses: Not hard at all  Food Insecurity: No Food Insecurity (03/07/2022)   Hunger Vital Sign    Worried About Running Out of Food in the Last Year: Never true    Ran Out of Food in the Last Year: Never  true  Transportation Needs: No Transportation Needs (03/07/2022)   PRAPARE - Administrator, Civil Service (Medical): No    Lack of Transportation (Non-Medical): No  Physical Activity: Sufficiently Active (03/07/2022)   Exercise Vital Sign    Days of Exercise per Week: 7 days    Minutes of Exercise per Session: 60 min  Stress: No Stress Concern Present (03/07/2022)   [employer] of Occupational Health - Occupational Stress Questionnaire    Feeling of Stress : Not at all  Social Connections: Unknown (03/07/2022)   Social Connection and Isolation Panel [NHANES]    Frequency of Communication with Friends and Family: Not on file    Frequency of Social Gatherings with Friends and Family: Not on file    Attends Religious Services: Not on file    Active Member of Clubs or  Organizations: Not on file    Attends Banker Meetings: Not on file    Marital Status: Married     Review of Systems  Constitutional:  Negative for appetite change and unexpected weight change.  HENT:  Negative for congestion and sinus pressure.   Respiratory:  Negative for cough, chest tightness and shortness of breath.   Cardiovascular:  Negative for chest pain, palpitations and leg swelling.  Gastrointestinal:  Negative for abdominal pain, diarrhea, nausea and vomiting.  Genitourinary:  Negative for difficulty urinating and dysuria.  Musculoskeletal:  Negative for joint swelling and myalgias.  Skin:  Negative for color change and rash.  Neurological:  Negative for dizziness and headaches.  Psychiatric/Behavioral:  Negative for agitation and dysphoric mood.        Objective:     BP 104/60   Pulse 65   Temp 97.9 F (36.6 C)   Resp 16   Ht 5\' 4"  (1.626 m)   Wt 114 lb 13.6 oz (52.1 kg)   SpO2 98%   BMI 19.71 kg/m  Wt Readings from Last 3 Encounters:  11/27/22 114 lb 13.6 oz (52.1 kg)  10/07/22 117 lb 6.4 oz (53.3 kg)  09/26/22 118 lb 9.6 oz (53.8 kg)    Physical Exam Vitals reviewed.  Constitutional:      General: She is not in acute distress.    Appearance: Normal appearance.  HENT:     Head: Normocephalic and atraumatic.     Right Ear: External ear normal.     Left Ear: External ear normal.  Eyes:     General: No scleral icterus.       Right eye: No discharge.        Left eye: No discharge.     Conjunctiva/sclera: Conjunctivae normal.  Neck:     Thyroid: No thyromegaly.  Cardiovascular:     Rate and Rhythm: Normal rate and regular rhythm.  Pulmonary:     Effort: No respiratory distress.     Breath sounds: Normal breath sounds. No wheezing.  Abdominal:     General: Bowel sounds are normal.     Palpations: Abdomen is soft.     Tenderness: There is no abdominal tenderness.  Musculoskeletal:        General: No swelling or tenderness.      Cervical back: Neck supple. No tenderness.  Lymphadenopathy:     Cervical: No cervical adenopathy.  Skin:    Findings: No erythema or rash.  Neurological:     Mental Status: She is alert.  Psychiatric:        Mood and Affect: Mood normal.  Behavior: Behavior normal.      Outpatient Encounter Medications as of 11/27/2022  Medication Sig   aspirin 81 MG chewable tablet Chew 81 mg by mouth daily.   azelastine (ASTELIN) 0.1 % nasal spray Place 1 spray into both nostrils 2 (two) times daily. Use in each nostril as directed   blood glucose meter kit and supplies KIT Dispense based on patient and insurance preference. Use up to four times daily as directed.   Coenzyme Q10 (CO Q 10 PO) Take 1 tablet by mouth daily at 6 (six) AM.   Cyanocobalamin (VITAMIN B12 PO) Take 2 tablets by mouth daily at 6 (six) AM. gummies   eszopiclone (LUNESTA) 2 MG TABS tablet TAKE ONE TABLET BY MOUTH AT BEDTIME AS NEEDED FOR SLEEP   glucose blood (FREESTYLE LITE) test strip Use to check blood sugars three times daily. E11.65   Insulin Pen Needle (PEN NEEDLES) 33G X 4 MM MISC Use to inject Ozempic   levothyroxine (SYNTHROID) 50 MCG tablet Take 1 tablet (50 mcg total) by mouth daily.   losartan (COZAAR) 25 MG tablet Take 1 tablet (25 mg total) by mouth daily.   MAGNESIUM PO Take 1 tablet by mouth daily at 6 (six) AM.   metFORMIN (GLUCOPHAGE-XR) 500 MG 24 hr tablet Take 2 tablets (1,000 mg total) by mouth 2 (two) times daily with a meal. TAKE 2 TABLETS TWICE A DAY WITH A MEAL   metoprolol succinate (TOPROL-XL) 25 MG 24 hr tablet Take 1 tablet (25 mg total) by mouth 2 (two) times daily.   Multiple Vitamin (MULTIVITAMIN) tablet Take 1 tablet by mouth daily.   mupirocin ointment (BACTROBAN) 2 % Apply 1 Application topically 2 (two) times daily.   Omega-3 Fatty Acids (FISH OIL PO) Take 2 tablets by mouth as needed.   OZEMPIC, 0.25 OR 0.5 MG/DOSE, 2 MG/3ML SOPN Inject 0.25 mg into the skin once a week.    polycarbophil (FIBERCON) 625 MG tablet Take 625 mg by mouth daily.   Probiotic Product (PROBIOTIC DAILY PO) Take 1 tablet by mouth daily at 6 (six) AM.   rosuvastatin (CRESTOR) 20 MG tablet Take 1 tablet (20 mg total) by mouth daily.   [DISCONTINUED] doxycycline (VIBRA-TABS) 100 MG tablet Take 1 tablet (100 mg total) by mouth 2 (two) times daily.   [DISCONTINUED] levothyroxine (SYNTHROID) 50 MCG tablet TAKE 1 TABLET DAILY   [DISCONTINUED] losartan (COZAAR) 25 MG tablet TAKE 1 TABLET DAILY   [DISCONTINUED] metFORMIN (GLUCOPHAGE-XR) 500 MG 24 hr tablet TAKE 2 TABLETS TWICE A DAY WITH A MEAL   [DISCONTINUED] metoprolol succinate (TOPROL-XL) 25 MG 24 hr tablet TAKE 1 TABLET IN THE MORNING AND ONE-HALF TABLET (12.5 MG) AT BEDTIME   [DISCONTINUED] ondansetron (ZOFRAN-ODT) 4 MG disintegrating tablet Take 1 tablet (4 mg total) by mouth every 8 (eight) hours as needed for nausea or vomiting. (Patient not taking: Reported on 10/07/2022)   [DISCONTINUED] rosuvastatin (CRESTOR) 20 MG tablet TAKE 1 TABLET DAILY   No facility-administered encounter medications on file as of 11/27/2022.     Lab Results  Component Value Date   WBC 4.1 01/10/2022   HGB 12.6 01/10/2022   HCT 36.8 01/10/2022   PLT 270.0 01/10/2022   GLUCOSE 107 (H) 11/18/2022   CHOL 129 11/18/2022   TRIG 52.0 11/18/2022   HDL 77.20 11/18/2022   LDLCALC 41 11/18/2022   ALT 19 11/18/2022   AST 27 11/18/2022   NA 134 (L) 11/18/2022   K 4.6 11/18/2022   CL 97 11/18/2022  CREATININE 0.81 11/18/2022   BUN 8 11/18/2022   CO2 31 11/18/2022   TSH 0.90 11/18/2022   HGBA1C 6.6 (H) 11/18/2022   MICROALBUR <0.7 11/18/2022       Assessment & Plan:  Type 2 diabetes mellitus with hyperglycemia, without long-term current use of insulin (HCC) Assessment & Plan: On metformin - now 1500mg  per day.  Back on ozempic .25mg .  Follow closely.  Weight stable.  Follow met b and a1c. A1c improved 6.6.  hold on making changes in her medication.  Follow.    Orders: -     Hemoglobin A1c; Future  Hypercholesterolemia Assessment & Plan: Continue crestor.  Follow lipid panel and liver function tests.    Orders: -     Lipid panel; Future -     Hepatic function panel; Future  Hypertension, essential Assessment & Plan: On losartan.  Follow pressures.  Follow metabolic panel.   Orders: -     Basic metabolic panel; Future -     CBC with Differential/Platelet; Future  Diabetes mellitus without complication (HCC) -     metFORMIN HCl ER; Take 2 tablets (1,000 mg total) by mouth 2 (two) times daily with a meal. TAKE 2 TABLETS TWICE A DAY WITH A MEAL  Dispense: 360 tablet; Refill: 3  Hoarseness -     Ambulatory referral to Speech Therapy  Difficulty sleeping Assessment & Plan: Continues on lunesta.    Hypothyroidism, unspecified type Assessment & Plan: On thyroid replacement.  Follow tsh.    Stress Assessment & Plan: Increased stress as outlined. Discussed.  Does not feel needs any further intervention at this time. Follow.    Gastroesophageal reflux disease, unspecified whether esophagitis present Assessment & Plan:  Some occasional acid reflux.  Relates to certain foods she eats.  If she avoids these foods (for example - pizza) - no problems.  No abdominal pain. Trial of pepcid.  Follow.  Notify if persistent symptoms.    Other orders -     Levothyroxine Sodium; Take 1 tablet (50 mcg total) by mouth daily.  Dispense: 90 tablet; Refill: 3 -     Losartan Potassium; Take 1 tablet (25 mg total) by mouth daily.  Dispense: 90 tablet; Refill: 3 -     Rosuvastatin Calcium; Take 1 tablet (20 mg total) by mouth daily.  Dispense: 90 tablet; Refill: 3 -     Metoprolol Succinate ER; Take 1 tablet (25 mg total) by mouth 2 (two) times daily.  Dispense: 180 tablet; Refill: 3     Dale , MD

## 2022-11-30 ENCOUNTER — Encounter: Payer: Self-pay | Admitting: Internal Medicine

## 2022-11-30 DIAGNOSIS — K219 Gastro-esophageal reflux disease without esophagitis: Secondary | ICD-10-CM | POA: Insufficient documentation

## 2022-11-30 NOTE — Assessment & Plan Note (Signed)
Continues on lunesta.  

## 2022-11-30 NOTE — Assessment & Plan Note (Signed)
On thyroid replacement.  Follow tsh.  

## 2022-11-30 NOTE — Assessment & Plan Note (Signed)
Increased stress as outlined.  Discussed.  Does not feel needs any further intervention at this time.  Follow.  

## 2022-11-30 NOTE — Assessment & Plan Note (Signed)
Continue crestor. Follow lipid panel and liver function tests.   

## 2022-11-30 NOTE — Assessment & Plan Note (Signed)
On losartan.  Follow pressures.  Follow metabolic panel.  

## 2022-11-30 NOTE — Assessment & Plan Note (Signed)
On metformin - now 1500mg  per day.  Back on ozempic .25mg .  Follow closely.  Weight stable.  Follow met b and a1c. A1c improved 6.6.  hold on making changes in her medication.  Follow.

## 2022-11-30 NOTE — Assessment & Plan Note (Signed)
Some occasional acid reflux.  Relates to certain foods she eats.  If she avoids these foods (for example - pizza) - no problems.  No abdominal pain. Trial of pepcid.  Follow.  Notify if persistent symptoms.

## 2022-12-31 ENCOUNTER — Ambulatory Visit: Payer: Medicare Other | Admitting: Speech Pathology

## 2023-01-02 ENCOUNTER — Ambulatory Visit: Payer: Medicare Other | Admitting: Speech Pathology

## 2023-01-05 ENCOUNTER — Ambulatory Visit: Payer: Medicare Other | Admitting: Speech Pathology

## 2023-01-05 ENCOUNTER — Other Ambulatory Visit: Payer: Self-pay | Admitting: Internal Medicine

## 2023-01-06 ENCOUNTER — Ambulatory Visit: Payer: Medicare Other | Admitting: Speech Pathology

## 2023-01-09 ENCOUNTER — Ambulatory Visit: Payer: Medicare Other | Admitting: Speech Pathology

## 2023-01-12 ENCOUNTER — Encounter: Payer: Medicare Other | Admitting: Speech Pathology

## 2023-01-13 ENCOUNTER — Ambulatory Visit: Payer: Medicare Other | Attending: Internal Medicine | Admitting: Speech Pathology

## 2023-01-13 DIAGNOSIS — R490 Dysphonia: Secondary | ICD-10-CM | POA: Diagnosis not present

## 2023-01-13 NOTE — Therapy (Signed)
OUTPATIENT SPEECH LANGUAGE PATHOLOGY  VOICE EVALUATION   Patient Name: Donna Howell MRN: [id number] DOB:03-20-1951, 72 y.o., female Today's Date: 01/13/2023  PCP: Dale Doddridge REFERRING PROVIDER: Westley Hummer Scott/ Gigi Gin   End of Session - 01/13/23 1011     Visit Number 1    Number of Visits 17    Date for SLP Re-Evaluation 04/07/23    Authorization Type Medicare A/Medicare B    Progress Note Due on Visit 10    SLP Start Time 0800    SLP Stop Time  0845    SLP Time Calculation (min) 45 min    Activity Tolerance Patient tolerated treatment well             Past Medical History:  Diagnosis Date   Anemia    Arthritis    Chicken pox    Diabetes (HCC)    Family history of adverse reaction to anesthesia    daughter coded during surgery and son had trouble breathing during surgery   Frequent headaches    GERD (gastroesophageal reflux disease)    Hay fever    Heart murmur    High blood pressure    History of bronchitis    Hypothyroidism    IBS (irritable bowel syndrome)    Liver cyst    Mitral valve regurgitation    Tricuspid regurgitation    Past Surgical History:  Procedure Laterality Date   APPENDECTOMY  1974   BREAST BIOPSY Left 01/16/2021   Affirm bx-"Ribbon" clip-path pending   CERVICAL CERCLAGE     DIAGNOSTIC LAPAROSCOPY     SHOULDER ARTHROSCOPY WITH SUBACROMIAL DECOMPRESSION, ROTATOR CUFF REPAIR AND BICEP TENDON REPAIR Right 12/24/2021   Procedure: RIGHT SHOULDER ARTHROSCOPY WITH DEBRIDEMENT, DECOMPRESSION, ROTATOR CUFF RELEASE, AND BICEPS TENODESIS;  Surgeon: Christena Flake, MD;  Location: [facility] ORS;  Service: Orthopedics;  Laterality: Right;   TONSILLECTOMY AND ADENOIDECTOMY  1962   Patient Active Problem List   Diagnosis Date Noted   Acid reflux 11/30/2022   Acute non-recurrent maxillary sinusitis 10/07/2022   Internal nasal lesion 09/28/2022   Tickle in throat 09/28/2022   Sore on toe 01/20/2022   Skin lesion 01/20/2022    Hyponatremia 01/15/2022   Injury of tendon of long head of right biceps 12/27/2021   Rotator cuff tendinitis, right 08/12/2021   Traumatic complete tear of right rotator cuff 08/12/2021   Dysuria 08/02/2021   Shoulder pain 06/22/2021   Abnormal mammogram 03/31/2021   Right leg pain 11/25/2020   Rectal lump 09/22/2020   Leg cramps 04/01/2020   Hypercholesterolemia 07/16/2019   Stress 07/16/2019   Environmental allergies 10/30/2018   Difficulty sleeping 06/26/2018   Tricuspid valve insufficiency 04/07/2018   Fluttering heart 11/07/2017   Raynaud's disease 05/09/2017   Side pain 05/09/2017   Healthcare maintenance 12/06/2016   Back pain 11/09/2016   Colon cancer screening 11/09/2016   Type 2 diabetes mellitus with hyperglycemia (HCC) 11/06/2016   Hypertension, essential 11/06/2016   Hypothyroidism 11/06/2016    ONSET DATE:  January 2024; date of initial referral by ENT 10/20/2022; date of current referral by PCP 11/27/2022  REFERRING DIAG: R49.0 (ICD-10-CM) - Hoarseness; R49.0 (ICD-10-CM) - Flaccid dysphonia   THERAPY DIAG:  Dysphonia  Rationale for Evaluation and Treatment Rehabilitation  SUBJECTIVE:   SUBJECTIVE STATEMENT: "I am not sure that vocal strain is what is wrong with my voice" Pt accompanied by: self  PERTINENT HISTORY: Pt is a 72 year old female who was seen by ENT on 10/20/2022 for hoarseness that is  likely multifactorial in nature - post-nasal drip, throat clearing, excessive use (husband is hard of hearing). Pt with history of reflux related to diabetes medication. Currently taking Pepcid.For her allergies, pt takes Claritin daily and was prescribed Nasacort by ENT which she reports that she has forgotten to take.   DIAGNOSTIC FINDINGS: Laryngoscopy on 10/20/2022 was negative for nodules or masses. ENT report "Patient's hoarseness likely due to a combination of drainage from allergies and voice strain from talking to her mother with a raised voice for 6  years."  PAIN:  Are you having pain? No   FALLS: Has patient fallen in last 6 months? No, Number of falls: N/A  LIVING ENVIRONMENT: Lives with: lives with their family Lives in: House/apartment  PLOF: Independent  PATIENT GOALS    to figure out if she is having   OBJECTIVE:  COGNITION: Overall cognitive status: Within functional limits for tasks assessed  SOCIAL HISTORY: Occupation: retired Counsellor intake: optimal Caffeine/alcohol intake: minimal Daily voice use: moderate Environmental risks: Stressful environment and Dry or dusty environment; "I am definitely raising my voice all the time because my husband is hard of hearing" Occupational risks: Parent and Other:  Misuse: Tension and Speaks without adequate breath support Phonotraumatic behaviors: Speaks in noise, Speaks at a great distance from listeners, and Excessive and/or habitual throat clearing  PERCEPTUAL VOICE ASSESSMENT: Voice quality: hoarse, breathy, harsh, rough, strained, and low vocal intensity Vocal abuse: habitual throat clearing, abnormal breathing pattern, and habitual abnormal pitch Resonance: normal Respiratory function: clavicular breathing  OBJECTIVE VOICE ASSESSMENT: Sustained "ah" maximum phonation time: 6.2 seconds Sustained "ah" loudness average: 72 dB Average fundamental frequency during sustained "ah":178.3 Hz   (2.4 SD below average of  244 Hz +/- 27 for gender)  Oral reading (passage) loudness average: 71 dB Oral reading loudness range: 13 dB (66dB - 79dB) Conversational pitch average: 164 Hz Highest dynamic pitch in conversational speech: 217 Hz Lowest dynamic pitch in conversational speech: 139 Hz Conversational pitch range: 78 Hz Conversational loudness average: 68 dB Conversational loudness range: 12 dB (62dB - 74dB) S/z ratio: 1.7 (Suggestive of dysfunction >1.0) Voice quality: hoarse, breathy, harsh, rough, strained, and low vocal intensity     ORAL MOTOR EXAMINATION Facial :  WFL Lingual: WFL Velum: WFL Mandible: WFL Cough: WFL Voice: Hoarse, Strained, Breathy, Weak   PATIENT REPORTED OUTCOME MEASURES (PROM):  VOICE HANDICAP INDEX (VHI)  The Voice Handicap Index is comprised of a series of questions to assess the patient's perception of their voice. It is designed to evaluate the emotional, physical and functional components of the voice problem.  Functional: 2 Physical: 14 Emotional: 4 Total: 20 (Normal mean 8.75, SD =14.97)  z score =  .7 no significant impact = 0-1.00  TODAY'S TREATMENT:  N/A   PATIENT EDUCATION: Education details: results of this assessment, ST POC Person educated: Patient Education method: Explanation Education comprehension: needs further education   HOME EXERCISE PROGRAM: N/A     GOALS: Goals reviewed with patient? Yes  SHORT TERM GOALS: Target date: 10 sessions  The patient will demonstrate abdominal breathing patterns and steady release of breath on exhalation to optimize efficiency of voicing and decrease laryngeal hyperfunction.  Baseline: Goal status: INITIAL  2.  The patient will eliminate phonotraumatic behaviors such as chronic throat clearing, by substituting non-traumatic methods to clear mucus.   Baseline:  Goal status: INITIAL  3.  The patient will decrease laryngeal and articulatory muscle tension by independently completing relaxation/stretching exercises.   Baseline:  Goal  status: INITIAL  4.  The patient will utilize a forward tone focused/resonant voice to decrease vocal hyperfunction and improve voice quality and vocal projection.  Baseline:  Goal status: INITIAL   LONG TERM GOALS: Target date: 04/07/2023  The patient will be independent for abdominal breathing and breath support exercises.   Baseline:  Goal status: INITIAL  2.  The patient will demonstrate independent understanding of vocal hygiene concepts.   Baseline:  Goal status: INITIAL  3.  Patient will report  improved communication effectiveness as measured by PROM  Baseline:  Goal status: INITIAL  4.  The patient will maintain relaxed phonation for paragraph length recitation with 80% accuracy.   Baseline:  Goal status: INITIAL   ASSESSMENT:  CLINICAL IMPRESSION: Patient is a 72 y.o. female who was seen today for Qualitative Voice Evaluation. She presents with mild dysphonia that is c/b habitual low pitch, breathy, strain and low vocal intensity.   OBJECTIVE IMPAIRMENTS include voice disorder. These impairments are limiting patient from effectively communicating at home and in community. Factors affecting potential to achieve goals and functional outcome are  husband is hard of hearing, also raising 3 puppies and voices home environment is stressful . Patient will benefit from skilled SLP services to address above impairments and improve overall function.  REHAB POTENTIAL: Good  PLAN: SLP FREQUENCY: 1x/week  SLP DURATION: 12 weeks  PLANNED INTERVENTIONS: SLP instruction and feedback and Patient/family education   Jinger Middlesworth B. Dreama Saa, M.S., CCC-SLP, Tree surgeon Certified Brain Injury Specialist [facility]  [facility] Rehabilitation Services Office [phone] Ascom [phone] Fax [phone]

## 2023-01-14 ENCOUNTER — Encounter: Payer: Medicare Other | Admitting: Speech Pathology

## 2023-01-16 ENCOUNTER — Ambulatory Visit: Payer: Medicare Other | Admitting: Speech Pathology

## 2023-01-20 ENCOUNTER — Encounter: Payer: Medicare Other | Admitting: Speech Pathology

## 2023-01-20 ENCOUNTER — Ambulatory Visit: Payer: Medicare Other | Admitting: Speech Pathology

## 2023-01-21 ENCOUNTER — Ambulatory Visit: Payer: Medicare Other | Admitting: Speech Pathology

## 2023-01-21 ENCOUNTER — Encounter: Payer: Medicare Other | Admitting: Speech Pathology

## 2023-01-23 ENCOUNTER — Ambulatory Visit: Payer: Medicare Other | Admitting: Speech Pathology

## 2023-01-26 ENCOUNTER — Ambulatory Visit: Payer: Medicare Other | Attending: Internal Medicine | Admitting: Speech Pathology

## 2023-01-26 DIAGNOSIS — R490 Dysphonia: Secondary | ICD-10-CM | POA: Insufficient documentation

## 2023-01-26 NOTE — Therapy (Unsigned)
OUTPATIENT SPEECH LANGUAGE PATHOLOGY  Treatment Note  Patient Name: Donna Howell MRN: [id number] DOB:03/01/1951, 72 y.o., female Today's Date: 01/26/2023  PCP: Dale Berkley REFERRING PROVIDER: Westley Hummer Scott/ Gigi Gin   End of Session - 01/26/23 1550     Visit Number 2    Number of Visits 17    Date for SLP Re-Evaluation 04/07/23    Authorization Type Medicare A/Medicare B    Progress Note Due on Visit 10    SLP Start Time 1500    SLP Stop Time  1540    SLP Time Calculation (min) 40 min    Activity Tolerance Patient tolerated treatment well             Past Medical History:  Diagnosis Date   Anemia    Arthritis    Chicken pox    Diabetes (HCC)    Family history of adverse reaction to anesthesia    daughter coded during surgery and son had trouble breathing during surgery   Frequent headaches    GERD (gastroesophageal reflux disease)    Hay fever    Heart murmur    High blood pressure    History of bronchitis    Hypothyroidism    IBS (irritable bowel syndrome)    Liver cyst    Mitral valve regurgitation    Tricuspid regurgitation    Past Surgical History:  Procedure Laterality Date   APPENDECTOMY  1974   BREAST BIOPSY Left 01/16/2021   Affirm bx-"Ribbon" clip-path pending   CERVICAL CERCLAGE     DIAGNOSTIC LAPAROSCOPY     SHOULDER ARTHROSCOPY WITH SUBACROMIAL DECOMPRESSION, ROTATOR CUFF REPAIR AND BICEP TENDON REPAIR Right 12/24/2021   Procedure: RIGHT SHOULDER ARTHROSCOPY WITH DEBRIDEMENT, DECOMPRESSION, ROTATOR CUFF RELEASE, AND BICEPS TENODESIS;  Surgeon: Christena Flake, MD;  Location: [facility] ORS;  Service: Orthopedics;  Laterality: Right;   TONSILLECTOMY AND ADENOIDECTOMY  1962   Patient Active Problem List   Diagnosis Date Noted   Acid reflux 11/30/2022   Acute non-recurrent maxillary sinusitis 10/07/2022   Internal nasal lesion 09/28/2022   Tickle in throat 09/28/2022   Sore on toe 01/20/2022   Skin lesion 01/20/2022   Hyponatremia  01/15/2022   Injury of tendon of long head of right biceps 12/27/2021   Rotator cuff tendinitis, right 08/12/2021   Traumatic complete tear of right rotator cuff 08/12/2021   Dysuria 08/02/2021   Shoulder pain 06/22/2021   Abnormal mammogram 03/31/2021   Right leg pain 11/25/2020   Rectal lump 09/22/2020   Leg cramps 04/01/2020   Hypercholesterolemia 07/16/2019   Stress 07/16/2019   Environmental allergies 10/30/2018   Difficulty sleeping 06/26/2018   Tricuspid valve insufficiency 04/07/2018   Fluttering heart 11/07/2017   Raynaud's disease 05/09/2017   Side pain 05/09/2017   Healthcare maintenance 12/06/2016   Back pain 11/09/2016   Colon cancer screening 11/09/2016   Type 2 diabetes mellitus with hyperglycemia (HCC) 11/06/2016   Hypertension, essential 11/06/2016   Hypothyroidism 11/06/2016    ONSET DATE:  January 2024; date of initial referral by ENT 10/20/2022; date of current referral by PCP 11/27/2022  REFERRING DIAG: R49.0 (ICD-10-CM) - Hoarseness; R49.0 (ICD-10-CM) - Flaccid dysphonia   THERAPY DIAG:  Dysphonia  Rationale for Evaluation and Treatment Rehabilitation  SUBJECTIVE:   SUBJECTIVE STATEMENT: "I am not sure that vocal strain is what is wrong with my voice" Pt accompanied by: self  PERTINENT HISTORY: Pt is a 72 year old female who was seen by ENT on 10/20/2022 for hoarseness that is likely  multifactorial in nature - post-nasal drip, throat clearing, excessive use (husband is hard of hearing). Pt with history of reflux related to diabetes medication. Currently taking Pepcid.For her allergies, pt takes Claritin daily and was prescribed Nasacort by ENT which she reports that she has forgotten to take.   DIAGNOSTIC FINDINGS: Laryngoscopy on 10/20/2022 was negative for nodules or masses. ENT report "Patient's hoarseness likely due to a combination of drainage from allergies and voice strain from talking to her mother with a raised voice for 6 years."  PAIN:   Are you having pain? No   FALLS: Has patient fallen in last 6 months? No, Number of falls: N/A  LIVING ENVIRONMENT: Lives with: lives with their family Lives in: House/apartment  PLOF: Independent  PATIENT GOALS    to figure out if she is having   OBJECTIVE:  COGNITION: Overall cognitive status: Within functional limits for tasks assessed  SOCIAL HISTORY: Occupation: retired Counsellor intake: optimal Caffeine/alcohol intake: minimal Daily voice use: moderate Environmental risks: Stressful environment and Dry or dusty environment; "I am definitely raising my voice all the time because my husband is hard of hearing" Occupational risks: Parent and Other:  Misuse: Tension and Speaks without adequate breath support Phonotraumatic behaviors: Speaks in noise, Speaks at a great distance from listeners, and Excessive and/or habitual throat clearing  PERCEPTUAL VOICE ASSESSMENT: Voice quality: hoarse, breathy, harsh, rough, strained, and low vocal intensity Vocal abuse: habitual throat clearing, abnormal breathing pattern, and habitual abnormal pitch Resonance: normal Respiratory function: clavicular breathing  OBJECTIVE VOICE ASSESSMENT: Sustained "ah" maximum phonation time: 6.2 seconds Sustained "ah" loudness average: 72 dB Average fundamental frequency during sustained "ah":178.3 Hz   (2.4 SD below average of  244 Hz +/- 27 for gender)  Oral reading (passage) loudness average: 71 dB Oral reading loudness range: 13 dB (66dB - 79dB) Conversational pitch average: 164 Hz Highest dynamic pitch in conversational speech: 217 Hz Lowest dynamic pitch in conversational speech: 139 Hz Conversational pitch range: 78 Hz Conversational loudness average: 68 dB Conversational loudness range: 12 dB (62dB - 74dB) S/z ratio: 1.7 (Suggestive of dysfunction >1.0) Voice quality: hoarse, breathy, harsh, rough, strained, and low vocal intensity     ORAL MOTOR EXAMINATION Facial : WFL Lingual:  WFL Velum: WFL Mandible: WFL Cough: WFL Voice: Hoarse, Strained, Breathy, Weak   PATIENT REPORTED OUTCOME MEASURES (PROM):  VOICE HANDICAP INDEX (VHI)  The Voice Handicap Index is comprised of a series of questions to assess the patient's perception of their voice. It is designed to evaluate the emotional, physical and functional components of the voice problem.  Functional: 2 Physical: 14 Emotional: 4 Total: 20 (Normal mean 8.75, SD =14.97)  z score =  .7 no significant impact = 0-1.00  TODAY'S TREATMENT:  N/A   PATIENT EDUCATION: Education details: results of this assessment, ST POC Person educated: Patient Education method: Explanation Education comprehension: needs further education   HOME EXERCISE PROGRAM: N/A     GOALS: Goals reviewed with patient? Yes  SHORT TERM GOALS: Target date: 10 sessions  The patient will demonstrate abdominal breathing patterns and steady release of breath on exhalation to optimize efficiency of voicing and decrease laryngeal hyperfunction.  Baseline: Goal status: INITIAL  2.  The patient will eliminate phonotraumatic behaviors such as chronic throat clearing, by substituting non-traumatic methods to clear mucus.   Baseline:  Goal status: INITIAL  3.  The patient will decrease laryngeal and articulatory muscle tension by independently completing relaxation/stretching exercises.   Baseline:  Goal status:  INITIAL  4.  The patient will utilize a forward tone focused/resonant voice to decrease vocal hyperfunction and improve voice quality and vocal projection.  Baseline:  Goal status: INITIAL   LONG TERM GOALS: Target date: 04/07/2023  The patient will be independent for abdominal breathing and breath support exercises.   Baseline:  Goal status: INITIAL  2.  The patient will demonstrate independent understanding of vocal hygiene concepts.   Baseline:  Goal status: INITIAL  3.  Patient will report improved  communication effectiveness as measured by PROM  Baseline:  Goal status: INITIAL  4.  The patient will maintain relaxed phonation for paragraph length recitation with 80% accuracy.   Baseline:  Goal status: INITIAL   ASSESSMENT:  CLINICAL IMPRESSION: Patient is a 72 y.o. female who was seen today for Qualitative Voice Evaluation. She presents with mild dysphonia that is c/b habitual low pitch, breathy, strain and low vocal intensity.   OBJECTIVE IMPAIRMENTS include voice disorder. These impairments are limiting patient from effectively communicating at home and in community. Factors affecting potential to achieve goals and functional outcome are  husband is hard of hearing, also raising 3 puppies and voices home environment is stressful . Patient will benefit from skilled SLP services to address above impairments and improve overall function.  REHAB POTENTIAL: Good  PLAN: SLP FREQUENCY: 1x/week  SLP DURATION: 12 weeks  PLANNED INTERVENTIONS: SLP instruction and feedback and Patient/family education   Rawad Bochicchio B. Dreama Saa, M.S., CCC-SLP, Tree surgeon Certified Brain Injury Specialist [facility]  [facility] Rehabilitation Services Office [phone] Ascom [phone] Fax [phone]

## 2023-01-27 ENCOUNTER — Ambulatory Visit: Payer: Medicare Other | Admitting: Speech Pathology

## 2023-01-28 ENCOUNTER — Encounter: Payer: Medicare Other | Admitting: Speech Pathology

## 2023-01-31 ENCOUNTER — Other Ambulatory Visit: Payer: Self-pay | Admitting: Internal Medicine

## 2023-02-03 ENCOUNTER — Ambulatory Visit: Payer: Medicare Other | Admitting: Speech Pathology

## 2023-02-03 ENCOUNTER — Encounter: Payer: Medicare Other | Admitting: Speech Pathology

## 2023-02-05 ENCOUNTER — Encounter: Payer: Medicare Other | Admitting: Speech Pathology

## 2023-02-06 ENCOUNTER — Ambulatory Visit: Payer: Medicare Other | Admitting: Speech Pathology

## 2023-02-06 DIAGNOSIS — R490 Dysphonia: Secondary | ICD-10-CM

## 2023-02-06 NOTE — Therapy (Signed)
OUTPATIENT SPEECH LANGUAGE PATHOLOGY  Treatment Note  Patient Name: Donna Howell MRN: [id number] DOB:[birth year], 72 y.o., female Today's Date: 02/06/2023  PCP: [facility] REFERRING PROVIDER: Westley Hummer Scott/ Gigi Gin   End of Session - 02/06/23 0844     Visit Number 3    Number of Visits 17    Date for SLP Re-Evaluation 04/07/23    Authorization Type Medicare A/Medicare B    Progress Note Due on Visit 10    SLP Start Time 0845    SLP Stop Time  0930    SLP Time Calculation (min) 45 min    Activity Tolerance Patient tolerated treatment well             Past Medical History:  Diagnosis Date   Anemia    Arthritis    Chicken pox    Diabetes (HCC)    Family history of adverse reaction to anesthesia    daughter coded during surgery and son had trouble breathing during surgery   Frequent headaches    GERD (gastroesophageal reflux disease)    Hay fever    Heart murmur    High blood pressure    History of bronchitis    Hypothyroidism    IBS (irritable bowel syndrome)    Liver cyst    Mitral valve regurgitation    Tricuspid regurgitation    Past Surgical History:  Procedure Laterality Date   APPENDECTOMY  1974   BREAST BIOPSY Left 01/16/2021   Affirm bx-"Ribbon" clip-path pending   CERVICAL CERCLAGE     DIAGNOSTIC LAPAROSCOPY     SHOULDER ARTHROSCOPY WITH SUBACROMIAL DECOMPRESSION, ROTATOR CUFF REPAIR AND BICEP TENDON REPAIR Right 12/24/2021   Procedure: RIGHT SHOULDER ARTHROSCOPY WITH DEBRIDEMENT, DECOMPRESSION, ROTATOR CUFF RELEASE, AND BICEPS TENODESIS;  Surgeon: Christena Flake, MD;  Location: [facility] ORS;  Service: Orthopedics;  Laterality: Right;   TONSILLECTOMY AND ADENOIDECTOMY  1962   Patient Active Problem List   Diagnosis Date Noted   Acid reflux 11/30/2022   Acute non-recurrent maxillary sinusitis 10/07/2022   Internal nasal lesion 09/28/2022   Tickle in throat 09/28/2022   Sore on toe 01/20/2022   Skin lesion 01/20/2022   Hyponatremia  01/15/2022   Injury of tendon of long head of right biceps 12/27/2021   Rotator cuff tendinitis, right 08/12/2021   Traumatic complete tear of right rotator cuff 08/12/2021   Dysuria 08/02/2021   Shoulder pain 06/22/2021   Abnormal mammogram 03/31/2021   Right leg pain 11/25/2020   Rectal lump 09/22/2020   Leg cramps 04/01/2020   Hypercholesterolemia 07/16/2019   Stress 07/16/2019   Environmental allergies 10/30/2018   Difficulty sleeping 06/26/2018   Tricuspid valve insufficiency 04/07/2018   Fluttering heart 11/07/2017   Raynaud's disease 05/09/2017   Side pain 05/09/2017   Healthcare maintenance 12/06/2016   Back pain 11/09/2016   Colon cancer screening 11/09/2016   Type 2 diabetes mellitus with hyperglycemia (HCC) 11/06/2016   Hypertension, essential 11/06/2016   Hypothyroidism 11/06/2016    ONSET DATE:  January 2024; date of initial referral by ENT 10/20/2022; date of current referral by PCP 11/27/2022  REFERRING DIAG: R49.0 (ICD-10-CM) - Hoarseness; R49.0 (ICD-10-CM) - Flaccid dysphonia   THERAPY DIAG:  Dysphonia  Rationale for Evaluation and Treatment Rehabilitation  SUBJECTIVE:   PERTINENT HISTORY: Pt is a 72 year old female who was seen by ENT on 10/20/2022 for hoarseness that is likely multifactorial in nature - post-nasal drip, throat clearing, excessive use (husband is hard of hearing). Pt with history of reflux related  to diabetes medication. Currently taking Pepcid.For her allergies, pt takes Claritin daily and was prescribed Nasacort by ENT which she reports that she has forgotten to take.   DIAGNOSTIC FINDINGS: Laryngoscopy on 10/20/2022 was negative for nodules or masses. ENT report "Patient's hoarseness likely due to a combination of drainage from allergies and voice strain from talking to her mother with a raised voice for 6 years."  PAIN:  Are you having pain? No   FALLS: Has patient fallen in last 6 months? No, Number of falls: N/A  LIVING  ENVIRONMENT: Lives with: lives with their family Lives in: House/apartment  PLOF: Independent  PATIENT GOALS    to figure out if she is having   SUBJECTIVE STATEMENT: Pt has appt with ENT next Wednesday Pt accompanied by: self  OBJECTIVE:   TODAY'S TREATMENT:  Skilled treatment session focused on pt's dysphonia goals. SLP facilitated session by providing the following interventions:  Pt arrived to session with the following questions/information:  "How do we know that this is caused by vocal strain bc I was reading online that ozempic and metformin can also cause hoarseness and I usually have a weird response to medicines." She further provides that her hoarseness is "worse in the morning after being outside (but is) almost gone by the afternoon." While she attempted the neck stretches, she reports increased back pain and headaches. Instructed to cease stretches.   She further comments "I find my self apologizing to people for my voice." Pt has scheduled a 3 hour quilting lesson later this morning and states that she finds herself apologizing because she ends up clearing her throat.  Skilled written and verbal information provided on habitual throat clears   PATIENT EDUCATION: Education details: see above Person educated: Patient Education method: Explanation Education comprehension: needs further education   HOME EXERCISE PROGRAM: Stretches Abdominal breathing   GOALS: Goals reviewed with patient? Yes  SHORT TERM GOALS: Target date: 10 sessions  The patient will demonstrate abdominal breathing patterns and steady release of breath on exhalation to optimize efficiency of voicing and decrease laryngeal hyperfunction.  Baseline: Goal status: INITIAL  2.  The patient will eliminate phonotraumatic behaviors such as chronic throat clearing, by substituting non-traumatic methods to clear mucus.   Baseline:  Goal status: INITIAL  3.  The patient will decrease laryngeal and  articulatory muscle tension by independently completing relaxation/stretching exercises.   Baseline:  Goal status: INITIAL  4.  The patient will utilize a forward tone focused/resonant voice to decrease vocal hyperfunction and improve voice quality and vocal projection.  Baseline:  Goal status: INITIAL   LONG TERM GOALS: Target date: 04/07/2023  The patient will be independent for abdominal breathing and breath support exercises.   Baseline:  Goal status: INITIAL  2.  The patient will demonstrate independent understanding of vocal hygiene concepts.   Baseline:  Goal status: INITIAL  3.  Patient will report improved communication effectiveness as measured by PROM  Baseline:  Goal status: INITIAL  4.  The patient will maintain relaxed phonation for paragraph length recitation with 80% accuracy.   Baseline:  Goal status: INITIAL   ASSESSMENT:  CLINICAL IMPRESSION: Patient is a 72 y.o. female who was seen today for Qualitative Voice Evaluation. She presents with mild dysphonia that is c/b habitual low pitch, breathy, strain and low vocal intensity. While she is eager to participate in therapy, she progress towards goals will likely be impacted by her ability to attend therapy 1xweek as well as her emotional stress.  See the above treatment note for details.   OBJECTIVE IMPAIRMENTS include voice disorder. These impairments are limiting patient from effectively communicating at home and in community. Factors affecting potential to achieve goals and functional outcome are  husband is hard of hearing, also raising 3 puppies and voices home environment is stressful . Patient will benefit from skilled SLP services to address above impairments and improve overall function.  REHAB POTENTIAL: Good  PLAN: SLP FREQUENCY: 1x/week  SLP DURATION: 12 weeks  PLANNED INTERVENTIONS: SLP instruction and feedback and Patient/family education   Rainn Zupko B. Dreama Saa, M.S., CCC-SLP,  Tree surgeon Certified Brain Injury Specialist [facility]  [facility] Rehabilitation Services Office [phone] Ascom [phone] Fax [phone]

## 2023-02-10 ENCOUNTER — Encounter: Payer: Medicare Other | Admitting: Speech Pathology

## 2023-02-10 ENCOUNTER — Ambulatory Visit: Payer: Medicare Other | Admitting: Speech Pathology

## 2023-02-12 DIAGNOSIS — R490 Dysphonia: Secondary | ICD-10-CM | POA: Diagnosis not present

## 2023-02-17 ENCOUNTER — Ambulatory Visit: Payer: Medicare Other | Admitting: Speech Pathology

## 2023-02-17 ENCOUNTER — Encounter: Payer: Medicare Other | Admitting: Speech Pathology

## 2023-02-19 ENCOUNTER — Encounter: Payer: Medicare Other | Admitting: Speech Pathology

## 2023-02-23 DIAGNOSIS — H2513 Age-related nuclear cataract, bilateral: Secondary | ICD-10-CM | POA: Diagnosis not present

## 2023-02-23 DIAGNOSIS — H04123 Dry eye syndrome of bilateral lacrimal glands: Secondary | ICD-10-CM | POA: Diagnosis not present

## 2023-02-23 DIAGNOSIS — H524 Presbyopia: Secondary | ICD-10-CM | POA: Diagnosis not present

## 2023-02-23 DIAGNOSIS — E119 Type 2 diabetes mellitus without complications: Secondary | ICD-10-CM | POA: Diagnosis not present

## 2023-02-24 ENCOUNTER — Ambulatory Visit: Payer: Medicare Other | Admitting: Speech Pathology

## 2023-02-24 ENCOUNTER — Encounter: Payer: Medicare Other | Admitting: Speech Pathology

## 2023-02-25 ENCOUNTER — Encounter (INDEPENDENT_AMBULATORY_CARE_PROVIDER_SITE_OTHER): Payer: Self-pay

## 2023-02-26 DIAGNOSIS — E119 Type 2 diabetes mellitus without complications: Secondary | ICD-10-CM | POA: Diagnosis not present

## 2023-03-02 ENCOUNTER — Encounter: Payer: Medicare Other | Admitting: Speech Pathology

## 2023-03-02 ENCOUNTER — Other Ambulatory Visit: Payer: Self-pay | Admitting: Internal Medicine

## 2023-03-02 DIAGNOSIS — Z1231 Encounter for screening mammogram for malignant neoplasm of breast: Secondary | ICD-10-CM

## 2023-03-03 ENCOUNTER — Encounter: Payer: Medicare Other | Admitting: Speech Pathology

## 2023-03-04 ENCOUNTER — Encounter: Payer: Medicare Other | Admitting: Speech Pathology

## 2023-03-07 LAB — HM DIABETES EYE EXAM

## 2023-03-09 ENCOUNTER — Encounter: Payer: Medicare Other | Admitting: Speech Pathology

## 2023-03-09 ENCOUNTER — Encounter: Payer: Self-pay | Admitting: Internal Medicine

## 2023-03-10 ENCOUNTER — Other Ambulatory Visit: Payer: Self-pay | Admitting: Internal Medicine

## 2023-03-10 ENCOUNTER — Ambulatory Visit (INDEPENDENT_AMBULATORY_CARE_PROVIDER_SITE_OTHER): Payer: Medicare Other | Admitting: *Deleted

## 2023-03-10 VITALS — Ht 64.0 in | Wt 113.2 lb

## 2023-03-10 DIAGNOSIS — Z0000 Encounter for general adult medical examination without abnormal findings: Secondary | ICD-10-CM | POA: Diagnosis not present

## 2023-03-10 NOTE — Progress Notes (Signed)
Subjective:   Donna Howell is a 72 y.o. female who presents for Medicare Annual (Subsequent) preventive examination.  Visit Complete: Virtual  I connected with  [name] on 03/10/23 by a audio enabled telemedicine application and verified that I am speaking with the correct person using two identifiers.  Patient Location: Home  Provider Location: Office/Clinic  I discussed the limitations of evaluation and management by telemedicine. The patient expressed understanding and agreed to proceed.  Vital Signs: Unable to obtain new vitals due to this being a telehealth visit.   Review of Systems     Cardiac Risk Factors include: advanced age (>64men, >98 women);diabetes mellitus;dyslipidemia;hypertension     Objective:    Today's Vitals   03/10/23 1334  Weight: 113 lb 3 oz (51.3 kg)  Height: 5\' 4"  (1.626 m)   Body mass index is 19.43 kg/m.     03/10/2023    1:51 PM 03/07/2022    1:27 PM 12/19/2021    2:58 PM 02/08/2021   10:05 AM 02/07/2020   10:16 AM 02/04/2019   11:10 AM 02/01/2018    9:44 AM  Advanced Directives  Does Patient Have a Medical Advance Directive? Yes Yes Yes Yes Yes Yes Yes  Type of Estate agent of [place];Living will Healthcare Power of [place];Living will  Healthcare Power of [place];Living will Healthcare Power of [place];Living will Healthcare Power of [place];Living will Healthcare Power of [place];Living will  Does patient want to make changes to medical advance directive?  No - Patient declined  No - Patient declined No - Patient declined No - Patient declined No - Patient declined  Copy of Healthcare Power of Attorney in Chart? No - copy requested No - copy requested  No - copy requested No - copy requested No - copy requested No - copy requested    Current Medications (verified) Outpatient Encounter Medications as of 03/10/2023  Medication Sig   aspirin 81 MG chewable tablet Chew 81 mg by mouth daily.   blood glucose  meter kit and supplies KIT Dispense based on patient and insurance preference. Use up to four times daily as directed.   Coenzyme Q10 (CO Q 10 PO) Take 1 tablet by mouth daily at 6 (six) AM.   Cyanocobalamin (VITAMIN B12 PO) Take 2 tablets by mouth daily at 6 (six) AM. gummies   eszopiclone (LUNESTA) 2 MG TABS tablet TAKE ONE TABLET BY MOUTH AT BEDTIME AS NEEDED FOR SLEEP   glucose blood (FREESTYLE LITE) test strip Use to check blood sugars three times daily. E11.65   Insulin Pen Needle (PEN NEEDLES) 33G X 4 MM MISC Use to inject Ozempic   levothyroxine (SYNTHROID) 50 MCG tablet Take 1 tablet (50 mcg total) by mouth daily.   losartan (COZAAR) 25 MG tablet Take 1 tablet (25 mg total) by mouth daily.   MAGNESIUM PO Take 1 tablet by mouth daily at 6 (six) AM.   metFORMIN (GLUCOPHAGE-XR) 500 MG 24 hr tablet Take 2 tablets (1,000 mg total) by mouth 2 (two) times daily with a meal. TAKE 2 TABLETS TWICE A DAY WITH A MEAL (Patient taking differently: Take 1,000 mg by mouth 2 (two) times daily with a meal. TAKE 2 TABLETS am and one pm)   metoprolol succinate (TOPROL-XL) 25 MG 24 hr tablet Take 1 tablet (25 mg total) by mouth 2 (two) times daily.   Multiple Vitamin (MULTIVITAMIN) tablet Take 1 tablet by mouth daily.   OZEMPIC, 0.25 OR 0.5 MG/DOSE, 2 MG/3ML SOPN INJECT 0.25 MG  UNDER THE SKIN ONCE A WEEK   polycarbophil (FIBERCON) 625 MG tablet Take 625 mg by mouth daily.   Probiotic Product (PROBIOTIC DAILY PO) Take 1 tablet by mouth daily at 6 (six) AM.   rosuvastatin (CRESTOR) 20 MG tablet Take 1 tablet (20 mg total) by mouth daily.   azelastine (ASTELIN) 0.1 % nasal spray Place 1 spray into both nostrils 2 (two) times daily. Use in each nostril as directed (Patient not taking: Reported on 03/10/2023)   mupirocin ointment (BACTROBAN) 2 % Apply 1 Application topically 2 (two) times daily. (Patient not taking: Reported on 03/10/2023)   Omega-3 Fatty Acids (FISH OIL PO) Take 2 tablets by mouth as needed.  (Patient not taking: Reported on 03/10/2023)   No facility-administered encounter medications on file as of 03/10/2023.    Allergies (verified) Contrast media [iodinated contrast media], Buspirone hcl, Penicillins, Percocet [oxycodone-acetaminophen], and Percodan [oxycodone-aspirin]   History: Past Medical History:  Diagnosis Date   Anemia    Arthritis    Chicken pox    Diabetes (HCC)    Family history of adverse reaction to anesthesia    daughter coded during surgery and son had trouble breathing during surgery   Frequent headaches    GERD (gastroesophageal reflux disease)    Hay fever    Heart murmur    High blood pressure    History of bronchitis    Hypothyroidism    IBS (irritable bowel syndrome)    Liver cyst    Mitral valve regurgitation    Tricuspid regurgitation    Past Surgical History:  Procedure Laterality Date   APPENDECTOMY  1974   BREAST BIOPSY Left 01/16/2021   Affirm bx-"Ribbon" clip-path pending   CERVICAL CERCLAGE     DIAGNOSTIC LAPAROSCOPY     SHOULDER ARTHROSCOPY WITH SUBACROMIAL DECOMPRESSION, ROTATOR CUFF REPAIR AND BICEP TENDON REPAIR Right 12/24/2021   Procedure: RIGHT SHOULDER ARTHROSCOPY WITH DEBRIDEMENT, DECOMPRESSION, ROTATOR CUFF RELEASE, AND BICEPS TENODESIS;  Surgeon: Christena Flake, MD;  Location: [facility] ORS;  Service: Orthopedics;  Laterality: Right;   TONSILLECTOMY AND ADENOIDECTOMY  1962   Family History  Problem Relation Age of Onset   Arthritis Mother    Heart disease Mother    Diabetes Mother    Stroke Father    Arthritis Sister    Breast cancer Sister 65   Alcoholism Brother    Arthritis Maternal Grandmother    Heart disease Maternal Grandmother    Diabetes Maternal Grandmother    Social History   Socioeconomic History   Marital status: Married    Spouse name: Not on file   Number of children: Not on file   Years of education: Not on file   Highest education level: Not on file  Occupational History   Not on file  Tobacco  Use   Smoking status: Never   Smokeless tobacco: Never  Vaping Use   Vaping status: Never Used  Substance and Sexual Activity   Alcohol use: No   Drug use: No   Sexual activity: Not on file  Other Topics Concern   Not on file  Social History Narrative   Not on file   Social Determinants of Health   Financial Resource Strain: Low Risk  (03/10/2023)   Overall Financial Resource Strain (CARDIA)    Difficulty of Paying Living Expenses: Not hard at all  Food Insecurity: No Food Insecurity (03/10/2023)   Hunger Vital Sign    Worried About Running Out of Food in the Last Year: Never true  Ran Out of Food in the Last Year: Never true  Transportation Needs: No Transportation Needs (03/10/2023)   PRAPARE - Administrator, Civil Service (Medical): No    Lack of Transportation (Non-Medical): No  Physical Activity: Sufficiently Active (03/10/2023)   Exercise Vital Sign    Days of Exercise per Week: 5 days    Minutes of Exercise per Session: 30 min  Stress: Stress Concern Present (03/10/2023)   [employer] of Occupational Health - Occupational Stress Questionnaire    Feeling of Stress : To some extent  Social Connections: Moderately Integrated (03/10/2023)   Social Connection and Isolation Panel [NHANES]    Frequency of Communication with Friends and Family: More than three times a week    Frequency of Social Gatherings with Friends and Family: Twice a week    Attends Religious Services: Never    Database administrator or Organizations: Yes    Attends Engineer, structural: More than 4 times per year    Marital Status: Married    Tobacco Counseling Counseling given: Not Answered   Clinical Intake:  Pre-visit preparation completed: Yes  Pain : No/denies pain     BMI - recorded: 19.43 Nutritional Status: BMI of 19-24  Normal Nutritional Risks: None Diabetes: Yes CBG done?: Yes (3 hours after eating 87) CBG resulted in Enter/ Edit results?: No Did pt.  bring in CBG monitor from home?: No  How often do you need to have someone help you when you read instructions, pamphlets, or other written materials from your doctor or pharmacy?: 1 - Never  Interpreter Needed?: No  Information entered by :: R. Gunnison Chahal   Activities of Daily Living    03/10/2023    1:37 PM  In your present state of health, do you have any difficulty performing the following activities:  Hearing? 0  Vision? 0  Comment glasses  Difficulty concentrating or making decisions? 0  Walking or climbing stairs? 0  Dressing or bathing? 0  Doing errands, shopping? 0  Preparing Food and eating ? N  Using the Toilet? N  In the past six months, have you accidently leaked urine? N  Do you have problems with loss of bowel control? N  Managing your Medications? N  Managing your Finances? N  Housekeeping or managing your Housekeeping? N    Patient Care Team: Dale Thaxton, MD as PCP - General (Internal Medicine)  Indicate any recent Medical Services you may have received from other than [facility] providers in the past year (date may be approximate).     Assessment:   This is a routine wellness examination for Donna Howell.  Hearing/Vision screen Hearing Screening - Comments:: No issues Vision Screening - Comments:: glasses  Dietary issues and exercise activities discussed:     Goals Addressed             This Visit's Progress    Patient Stated       Wants to stay healthy       Depression Screen    03/10/2023    1:43 PM 08/08/2022    7:08 AM 05/13/2022    1:01 PM 03/07/2022    1:25 PM 01/14/2022    2:46 PM 11/06/2021   12:11 PM 04/30/2021    4:31 PM  PHQ 2/9 Scores  PHQ - 2 Score 1 0 0 0 0 1 0  PHQ- 9 Score 3 3 0        Fall Risk    03/10/2023  1:38 PM 10/07/2022    3:02 PM 08/08/2022    7:08 AM 05/13/2022    1:01 PM 03/07/2022    1:27 PM  Fall Risk   Falls in the past year? 0 0 0 0 0  Number falls in past yr: 0 0 0 0 0  Injury with Fall? 0 0 0 0   Risk  for fall due to : No Fall Risks No Fall Risks No Fall Risks No Fall Risks   Follow up Falls prevention discussed;Falls evaluation completed Falls evaluation completed Falls evaluation completed Falls evaluation completed Falls evaluation completed    MEDICARE RISK AT HOME:  Medicare Risk at Home - 03/10/23 1339     Any stairs in or around the home? Yes    If so, are there any without handrails? No    Home free of loose throw rugs in walkways, pet beds, electrical cords, etc? Yes    Adequate lighting in your home to reduce risk of falls? Yes    Life alert? No    Use of a cane, walker or w/c? No    Grab bars in the bathroom? Yes    Shower chair or bench in shower? Yes    Elevated toilet seat or a handicapped toilet? No              Cognitive Function:    02/07/2020   10:39 AM 02/01/2018    9:51 AM  MMSE - Mini Mental State Exam  Not completed: Unable to complete   Orientation to time  5  Orientation to Place  5  Registration  3  Attention/ Calculation  5  Recall  3  Language- name 2 objects  2  Language- repeat  1  Language- follow 3 step command  3  Language- read & follow direction  1  Write a sentence  1  Copy design  1  Total score  30        03/10/2023    1:51 PM 02/04/2019   11:15 AM  6CIT Screen  What Year? 0 points 0 points  What month? 0 points 0 points  What time? 0 points 0 points  Count back from 20 0 points 0 points  Months in reverse 0 points 0 points  Repeat phrase 0 points 0 points  Total Score 0 points 0 points    Immunizations Immunization History  Administered Date(s) Administered   Influenza Whole 05/04/2017   Influenza,inj,Quad PF,6+ Mos 05/04/2017   Influenza-Unspecified 05/03/2018, 04/26/2021   Moderna Sars-Covid-2 Vaccination 08/30/2019, 09/27/2019, 04/28/2020, 10/26/2020, 04/19/2021   Pneumococcal Conjugate-13 08/01/2017   Pneumococcal Polysaccharide-23 10/26/2018   Td 07/11/2018   Zoster, Live 07/11/2013    TDAP status: Up to  date  Flu Vaccine status: Due, Education has been provided regarding the importance of this vaccine. Advised may receive this vaccine at local pharmacy or Health Dept. Aware to provide a copy of the vaccination record if obtained from local pharmacy or Health Dept. Verbalized acceptance and understanding.  Pneumococcal vaccine status: Up to date  Covid-19 vaccine status: Completed vaccines  Qualifies for Shingles Vaccine? Yes   Zostavax completed Yes   Shingrix Completed?: No.    Education has been provided regarding the importance of this vaccine. Patient has been advised to call insurance company to determine out of pocket expense if they have not yet received this vaccine. Advised may also receive vaccine at local pharmacy or Health Dept. Verbalized acceptance and understanding.  Screening Tests Health Maintenance  Topic  Date Due   Fecal DNA (Cologuard)  Never done   Zoster Vaccines- Shingrix (1 of 2) 07/09/2001   COVID-19 Vaccine (6 - 2023-24 season) 03/28/2022   MAMMOGRAM  12/29/2022   Medicare Annual Wellness (AWV)  03/08/2023   INFLUENZA VACCINE  02/26/2023   HEMOGLOBIN A1C  05/20/2023   FOOT EXAM  08/09/2023   Diabetic kidney evaluation - eGFR measurement  11/18/2023   Diabetic kidney evaluation - Urine ACR  11/18/2023   OPHTHALMOLOGY EXAM  03/06/2024   DTaP/Tdap/Td (2 - Tdap) 07/11/2028   Pneumonia Vaccine 60+ Years old  Completed   DEXA SCAN  Completed   Hepatitis C Screening  Completed   HPV VACCINES  Aged Out    Health Maintenance  Health Maintenance Due  Topic Date Due   Fecal DNA (Cologuard)  Never done   Zoster Vaccines- Shingrix (1 of 2) 07/09/2001   COVID-19 Vaccine (6 - 2023-24 season) 03/28/2022   MAMMOGRAM  12/29/2022   Medicare Annual Wellness (AWV)  03/08/2023   INFLUENZA VACCINE  02/26/2023    Colorectal Cancer Screening. Patient has a cologuard test at home but prefers to do a FIT test. Patient will discuss this with PCP at her upcoming visit.    Mammogram status: Completed 6/22. Repeat every year Scheduled 03/17/23  Bone Density status: Completed 2/19. Results reflect: Bone density results: NORMAL. Repeat every 2 years. Wants to discuss with PCP before scheduling this  Lung Cancer Screening: (Low Dose CT Chest recommended if Age 46-80 years, 20 pack-year currently smoking OR have quit w/in 15years.) does not qualify.    Additional Screening:  Hepatitis C Screening: does qualify; Completed 8/19  Vision Screening: Recommended annual ophthalmology exams for early detection of glaucoma and other disorders of the eye. Is the patient up to date with their annual eye exam?  Yes  Who is the provider or what is the name of the office in which the patient attends annual eye exams? [facility] If pt is not established with a provider, would they like to be referred to a provider to establish care? No .   Dental Screening: Recommended annual dental exams for proper oral hygiene  Diabetic Foot Exam: Diabetic Foot Exam: Completed 1/24  Community Resource Referral / Chronic Care Management: CRR required this visit?  No   CCM required this visit?  No     Plan:     I have personally reviewed and noted the following in the patient's chart:   Medical and social history Use of alcohol, tobacco or illicit drugs  Current medications and supplements including opioid prescriptions. Patient is not currently taking opioid prescriptions. Functional ability and status Nutritional status Physical activity Advanced directives List of other physicians Hospitalizations, surgeries, and ER visits in previous 12 months Vitals Screenings to include cognitive, depression, and falls Referrals and appointments  In addition, I have reviewed and discussed with patient certain preventive protocols, quality metrics, and best practice recommendations. A written personalized care plan for preventive services as well as general preventive health  recommendations were provided to patient.     Sydell Axon, LPN   [id number]   After Visit Summary: (MyChart) Due to this being a telephonic visit, the after visit summary with patients personalized plan was offered to patient via MyChart   Nurse Notes: None

## 2023-03-10 NOTE — Patient Instructions (Signed)
Donna Howell , Thank you for taking time to come for your Medicare Wellness Visit. I appreciate your ongoing commitment to your health goals. Please review the following plan we discussed and let me know if I can assist you in the future.   Referrals/Orders/Follow-Ups/Clinician Recommendations: None  This is a list of the screening recommended for you and due dates:  Health Maintenance  Topic Date Due   Cologuard (Stool DNA test)  Never done   Zoster (Shingles) Vaccine (1 of 2) 07/09/2001   COVID-19 Vaccine (6 - 2023-24 season) 03/28/2022   Mammogram  12/29/2022   Flu Shot  02/26/2023   Hemoglobin A1C  05/20/2023   Complete foot exam   08/09/2023   Yearly kidney function blood test for diabetes  11/18/2023   Yearly kidney health urinalysis for diabetes  11/18/2023   Eye exam for diabetics  03/06/2024   Medicare Annual Wellness Visit  03/09/2024   DTaP/Tdap/Td vaccine (2 - Tdap) 07/11/2028   Pneumonia Vaccine  Completed   DEXA scan (bone density measurement)  Completed   Hepatitis C Screening  Completed   HPV Vaccine  Aged Out    Advanced directives: (Copy Requested) Please bring a copy of your health care power of attorney and living will to the office to be added to your chart at your convenience.  Next Medicare Annual Wellness Visit scheduled for next year: Yes 03/11/24 @ 10:15  Preventive Care 65 Years and Older, Female Preventive care refers to lifestyle choices and visits with your health care provider that can promote health and wellness. What does preventive care include? A yearly physical exam. This is also called an annual well check. Dental exams once or twice a year. Routine eye exams. Ask your health care provider how often you should have your eyes checked. Personal lifestyle choices, including: Daily care of your teeth and gums. Regular physical activity. Eating a healthy diet. Avoiding tobacco and drug use. Limiting alcohol use. Practicing safe sex. Taking  low-dose aspirin every day. Taking vitamin and mineral supplements as recommended by your health care provider. What happens during an annual well check? The services and screenings done by your health care provider during your annual well check will depend on your age, overall health, lifestyle risk factors, and family history of disease. Counseling  Your health care provider may ask you questions about your: Alcohol use. Tobacco use. Drug use. Emotional well-being. Home and relationship well-being. Sexual activity. Eating habits. History of falls. Memory and ability to understand (cognition). Work and work Astronomer. Reproductive health. Screening  You may have the following tests or measurements: Height, weight, and BMI. Blood pressure. Lipid and cholesterol levels. These may be checked every 5 years, or more frequently if you are over 74 years old. Skin check. Lung cancer screening. You may have this screening every year starting at age 27 if you have a 30-pack-year history of smoking and currently smoke or have quit within the past 15 years. Fecal occult blood test (FOBT) of the stool. You may have this test every year starting at age 64. Flexible sigmoidoscopy or colonoscopy. You may have a sigmoidoscopy every 5 years or a colonoscopy every 10 years starting at age 73. Hepatitis C blood test. Hepatitis B blood test. Sexually transmitted disease (STD) testing. Diabetes screening. This is done by checking your blood sugar (glucose) after you have not eaten for a while (fasting). You may have this done every 1-3 years. Bone density scan. This is done to screen for osteoporosis.  You may have this done starting at age 45. Mammogram. This may be done every 1-2 years. Talk to your health care provider about how often you should have regular mammograms. Talk with your health care provider about your test results, treatment options, and if necessary, the need for more tests. Vaccines   Your health care provider may recommend certain vaccines, such as: Influenza vaccine. This is recommended every year. Tetanus, diphtheria, and acellular pertussis (Tdap, Td) vaccine. You may need a Td booster every 10 years. Zoster vaccine. You may need this after age 36. Pneumococcal 13-valent conjugate (PCV13) vaccine. One dose is recommended after age 32. Pneumococcal polysaccharide (PPSV23) vaccine. One dose is recommended after age 80. Talk to your health care provider about which screenings and vaccines you need and how often you need them. This information is not intended to replace advice given to you by your health care provider. Make sure you discuss any questions you have with your health care provider. Document Released: 08/10/2015 Document Revised: 04/02/2016 Document Reviewed: 05/15/2015 Elsevier Interactive Patient Education  2017 [employer].  Fall Prevention in the Home Falls can cause injuries. They can happen to people of all ages. There are many things you can do to make your home safe and to help prevent falls. What can I do on the outside of my home? Regularly fix the edges of walkways and driveways and fix any cracks. Remove anything that might make you trip as you walk through a door, such as a raised step or threshold. Trim any bushes or trees on the path to your home. Use bright outdoor lighting. Clear any walking paths of anything that might make someone trip, such as rocks or tools. Regularly check to see if handrails are loose or broken. Make sure that both sides of any steps have handrails. Any raised decks and porches should have guardrails on the edges. Have any leaves, snow, or ice cleared regularly. Use sand or salt on walking paths during winter. Clean up any spills in your garage right away. This includes oil or grease spills. What can I do in the bathroom? Use night lights. Install grab bars by the toilet and in the tub and shower. Do not use towel  bars as grab bars. Use non-skid mats or decals in the tub or shower. If you need to sit down in the shower, use a plastic, non-slip stool. Keep the floor dry. Clean up any water that spills on the floor as soon as it happens. Remove soap buildup in the tub or shower regularly. Attach bath mats securely with double-sided non-slip rug tape. Do not have throw rugs and other things on the floor that can make you trip. What can I do in the bedroom? Use night lights. Make sure that you have a light by your bed that is easy to reach. Do not use any sheets or blankets that are too big for your bed. They should not hang down onto the floor. Have a firm chair that has side arms. You can use this for support while you get dressed. Do not have throw rugs and other things on the floor that can make you trip. What can I do in the kitchen? Clean up any spills right away. Avoid walking on wet floors. Keep items that you use a lot in easy-to-reach places. If you need to reach something above you, use a strong step stool that has a grab bar. Keep electrical cords out of the way. Do not use  floor polish or wax that makes floors slippery. If you must use wax, use non-skid floor wax. Do not have throw rugs and other things on the floor that can make you trip. What can I do with my stairs? Do not leave any items on the stairs. Make sure that there are handrails on both sides of the stairs and use them. Fix handrails that are broken or loose. Make sure that handrails are as long as the stairways. Check any carpeting to make sure that it is firmly attached to the stairs. Fix any carpet that is loose or worn. Avoid having throw rugs at the top or bottom of the stairs. If you do have throw rugs, attach them to the floor with carpet tape. Make sure that you have a light switch at the top of the stairs and the bottom of the stairs. If you do not have them, ask someone to add them for you. What else can I do to help  prevent falls? Wear shoes that: Do not have high heels. Have rubber bottoms. Are comfortable and fit you well. Are closed at the toe. Do not wear sandals. If you use a stepladder: Make sure that it is fully opened. Do not climb a closed stepladder. Make sure that both sides of the stepladder are locked into place. Ask someone to hold it for you, if possible. Clearly mark and make sure that you can see: Any grab bars or handrails. First and last steps. Where the edge of each step is. Use tools that help you move around (mobility aids) if they are needed. These include: Canes. Walkers. Scooters. Crutches. Turn on the lights when you go into a dark area. Replace any light bulbs as soon as they burn out. Set up your furniture so you have a clear path. Avoid moving your furniture around. If any of your floors are uneven, fix them. If there are any pets around you, be aware of where they are. Review your medicines with your doctor. Some medicines can make you feel dizzy. This can increase your chance of falling. Ask your doctor what other things that you can do to help prevent falls. This information is not intended to replace advice given to you by your health care provider. Make sure you discuss any questions you have with your health care provider. Document Released: 05/10/2009 Document Revised: 12/20/2015 Document Reviewed: 08/18/2014 Elsevier Interactive Patient Education  2017 [employer].

## 2023-03-11 ENCOUNTER — Encounter: Payer: Medicare Other | Admitting: Speech Pathology

## 2023-03-12 ENCOUNTER — Encounter (INDEPENDENT_AMBULATORY_CARE_PROVIDER_SITE_OTHER): Payer: Self-pay

## 2023-03-17 ENCOUNTER — Ambulatory Visit
Admission: RE | Admit: 2023-03-17 | Discharge: 2023-03-17 | Disposition: A | Discharge status: Home / Self Care | Payer: Medicare Other | Source: Ambulatory Visit | Attending: Internal Medicine | Admitting: Internal Medicine

## 2023-03-17 DIAGNOSIS — Z1231 Encounter for screening mammogram for malignant neoplasm of breast: Secondary | ICD-10-CM | POA: Diagnosis not present

## 2023-03-27 ENCOUNTER — Other Ambulatory Visit: Payer: Medicare Other

## 2023-03-27 DIAGNOSIS — E7800 Pure hypercholesterolemia, unspecified: Secondary | ICD-10-CM

## 2023-03-27 DIAGNOSIS — I10 Essential (primary) hypertension: Secondary | ICD-10-CM

## 2023-03-27 DIAGNOSIS — E1165 Type 2 diabetes mellitus with hyperglycemia: Secondary | ICD-10-CM

## 2023-03-27 LAB — CBC WITH DIFFERENTIAL/PLATELET
Basophils Absolute: 0.1 10*3/uL (ref 0.0–0.1)
Basophils Relative: 1.1 % (ref 0.0–3.0)
Eosinophils Absolute: 0.2 10*3/uL (ref 0.0–0.7)
Eosinophils Relative: 2.9 % (ref 0.0–5.0)
HCT: 37.8 % (ref 36.0–46.0)
Hemoglobin: 12.6 g/dL (ref 12.0–15.0)
Lymphocytes Relative: 37.3 % (ref 12.0–46.0)
Lymphs Abs: 2 10*3/uL (ref 0.7–4.0)
MCHC: 33.4 g/dL (ref 30.0–36.0)
MCV: 90.6 fl (ref 78.0–100.0)
Monocytes Absolute: 0.7 10*3/uL (ref 0.1–1.0)
Monocytes Relative: 12.6 % — ABNORMAL HIGH (ref 3.0–12.0)
Neutro Abs: 2.4 10*3/uL (ref 1.4–7.7)
Neutrophils Relative %: 46.1 % (ref 43.0–77.0)
Platelets: 266 10*3/uL (ref 150.0–400.0)
RBC: 4.17 Mil/uL (ref 3.87–5.11)
RDW: 12.8 % (ref 11.5–15.5)
WBC: 5.3 10*3/uL (ref 4.0–10.5)

## 2023-03-27 LAB — LIPID PANEL
Cholesterol: 132 mg/dL (ref 0–200)
HDL: 76.7 mg/dL (ref >39.00)
LDL Cholesterol: 43 mg/dL (ref 0–99)
NonHDL: 55.58
Total CHOL/HDL Ratio: 2
Triglycerides: 64 mg/dL (ref 0.0–149.0)
VLDL: 12.8 mg/dL (ref 0.0–40.0)

## 2023-03-27 LAB — BASIC METABOLIC PANEL
BUN: 7 mg/dL (ref 6–23)
CO2: 30 mEq/L (ref 19–32)
Calcium: 9.7 mg/dL (ref 8.4–10.5)
Chloride: 97 mEq/L (ref 96–112)
Creatinine, Ser: 0.85 mg/dL (ref 0.40–1.20)
GFR: 68.79 mL/min (ref >60.00)
Glucose, Bld: 101 mg/dL — ABNORMAL HIGH (ref 70–99)
Potassium: 4.4 mEq/L (ref 3.5–5.1)
Sodium: 134 mEq/L — ABNORMAL LOW (ref 135–145)

## 2023-03-27 LAB — HEPATIC FUNCTION PANEL
ALT: 16 U/L (ref 0–35)
AST: 25 U/L (ref 0–37)
Albumin: 4.4 g/dL (ref 3.5–5.2)
Alkaline Phosphatase: 51 U/L (ref 39–117)
Bilirubin, Direct: 0.2 mg/dL (ref 0.0–0.3)
Total Bilirubin: 0.6 mg/dL (ref 0.2–1.2)
Total Protein: 6.8 g/dL (ref 6.0–8.3)

## 2023-03-27 LAB — HEMOGLOBIN A1C: Hgb A1c MFr Bld: 6.6 % — ABNORMAL HIGH (ref 4.6–6.5)

## 2023-04-01 ENCOUNTER — Ambulatory Visit: Payer: Medicare Other | Admitting: Internal Medicine

## 2023-04-01 ENCOUNTER — Telehealth: Payer: Self-pay | Admitting: Internal Medicine

## 2023-04-01 ENCOUNTER — Encounter: Payer: Self-pay | Admitting: Internal Medicine

## 2023-04-01 ENCOUNTER — Other Ambulatory Visit: Payer: Self-pay

## 2023-04-01 VITALS — BP 132/72 | HR 64 | Temp 97.5°F | Ht 64.0 in | Wt 114.6 lb

## 2023-04-01 DIAGNOSIS — E119 Type 2 diabetes mellitus without complications: Secondary | ICD-10-CM | POA: Diagnosis not present

## 2023-04-01 DIAGNOSIS — E2839 Other primary ovarian failure: Secondary | ICD-10-CM

## 2023-04-01 DIAGNOSIS — E871 Hypo-osmolality and hyponatremia: Secondary | ICD-10-CM

## 2023-04-01 DIAGNOSIS — G479 Sleep disorder, unspecified: Secondary | ICD-10-CM

## 2023-04-01 DIAGNOSIS — I7300 Raynaud's syndrome without gangrene: Secondary | ICD-10-CM | POA: Diagnosis not present

## 2023-04-01 DIAGNOSIS — E1165 Type 2 diabetes mellitus with hyperglycemia: Secondary | ICD-10-CM

## 2023-04-01 DIAGNOSIS — E039 Hypothyroidism, unspecified: Secondary | ICD-10-CM

## 2023-04-01 DIAGNOSIS — I10 Essential (primary) hypertension: Secondary | ICD-10-CM

## 2023-04-01 DIAGNOSIS — Z1211 Encounter for screening for malignant neoplasm of colon: Secondary | ICD-10-CM

## 2023-04-01 DIAGNOSIS — F439 Reaction to severe stress, unspecified: Secondary | ICD-10-CM

## 2023-04-01 DIAGNOSIS — E7800 Pure hypercholesterolemia, unspecified: Secondary | ICD-10-CM

## 2023-04-01 MED ORDER — ESZOPICLONE 1 MG PO TABS: 1.0000 mg | ORAL_TABLET | Freq: Every evening | ORAL | 1 refills | Status: DC | PRN

## 2023-04-01 MED ORDER — OZEMPIC (0.25 OR 0.5 MG/DOSE) 2 MG/3ML ~~LOC~~ SOPN: 0.5000 mg | PEN_INJECTOR | SUBCUTANEOUS | 5 refills | Status: DC

## 2023-04-01 MED ORDER — METFORMIN HCL ER 500 MG PO TB24
1000.0000 mg | ORAL_TABLET | Freq: Every day | ORAL | Status: DC
Start: 2023-04-01 — End: 2023-07-17

## 2023-04-01 NOTE — Telephone Encounter (Signed)
Resent prescription. LM to let patient know.

## 2023-04-01 NOTE — Assessment & Plan Note (Signed)
Continue crestor.  Follow lipid panel and liver function tests.  

## 2023-04-01 NOTE — Progress Notes (Signed)
Subjective:    Patient ID: Donna Howell, female    DOB: 1950-11-25, 72 y.o.   MRN: [id number]  Patient here for  Chief Complaint  Patient presents with   Medication Management    HPI Here to follow up regarding hypertension and diabetes. Also increased stress.  Discussed.  Overall she feels she is handling things relatively well. Does not feel needs any further intervention at this time. Continues on metformin and ozempic.  A1c stable 6.6.  she is concerned about her A1c remaining 6.6.  also reports presumed intolerance to metformin.  Reports after taking the two tablets in the am, will have some GI issues with lunch.  Wants to decease dose of metformin and increase ozempic dose. Saw ENT for hoarseness. Recommended speech therapy.  Stays active.  No chest pain.  Breathing stable.  No increased cough or congestion.  Discussed scheduling bone density.     Past Medical History:  Diagnosis Date   Anemia    Arthritis    Chicken pox    Diabetes (HCC)    Family history of adverse reaction to anesthesia    daughter coded during surgery and son had trouble breathing during surgery   Frequent headaches    GERD (gastroesophageal reflux disease)    Hay fever    Heart murmur    High blood pressure    History of bronchitis    Hypothyroidism    IBS (irritable bowel syndrome)    Liver cyst    Mitral valve regurgitation    Tricuspid regurgitation    Past Surgical History:  Procedure Laterality Date   APPENDECTOMY  1974   BREAST BIOPSY Left 01/16/2021   Affirm bx-"Ribbon" clip- PREDOMINANTLY BENIGN ADIPOSE TISSUE. - AREAS OF STROMAL FIBROSIS AND MAMMARY GLANDS WITH ATROPHIC AND APOCRINE CHANGES. - NEGATIVE FOR ATYPIA AND MALIGNANCY.   CERVICAL CERCLAGE     DIAGNOSTIC LAPAROSCOPY     SHOULDER ARTHROSCOPY WITH SUBACROMIAL DECOMPRESSION, ROTATOR CUFF REPAIR AND BICEP TENDON REPAIR Right 12/24/2021   Procedure: RIGHT SHOULDER ARTHROSCOPY WITH DEBRIDEMENT, DECOMPRESSION, ROTATOR CUFF  RELEASE, AND BICEPS TENODESIS;  Surgeon: Christena Flake, MD;  Location: [facility] ORS;  Service: Orthopedics;  Laterality: Right;   TONSILLECTOMY AND ADENOIDECTOMY  1962   Family History  Problem Relation Age of Onset   Arthritis Mother    Heart disease Mother    Diabetes Mother    Stroke Father    Arthritis Sister    Breast cancer Sister 68   Alcoholism Brother    Arthritis Maternal Grandmother    Heart disease Maternal Grandmother    Diabetes Maternal Grandmother    Social History   Socioeconomic History   Marital status: Married    Spouse name: Not on file   Number of children: Not on file   Years of education: Not on file   Highest education level: Not on file  Occupational History   Not on file  Tobacco Use   Smoking status: Never   Smokeless tobacco: Never  Vaping Use   Vaping status: Never Used  Substance and Sexual Activity   Alcohol use: No   Drug use: No   Sexual activity: Not on file  Other Topics Concern   Not on file  Social History Narrative   Not on file   Social Determinants of Health   Financial Resource Strain: Low Risk  (03/10/2023)   Overall Financial Resource Strain (CARDIA)    Difficulty of Paying Living Expenses: Not hard at all  Food Insecurity: No  Food Insecurity (03/10/2023)   Hunger Vital Sign    Worried About Running Out of Food in the Last Year: Never true    Ran Out of Food in the Last Year: Never true  Transportation Needs: No Transportation Needs (03/10/2023)   PRAPARE - Administrator, Civil Service (Medical): No    Lack of Transportation (Non-Medical): No  Physical Activity: Sufficiently Active (03/10/2023)   Exercise Vital Sign    Days of Exercise per Week: 5 days    Minutes of Exercise per Session: 30 min  Stress: Stress Concern Present (03/10/2023)   [employer] of Occupational Health - Occupational Stress Questionnaire    Feeling of Stress : To some extent  Social Connections: Moderately Integrated (03/10/2023)    Social Connection and Isolation Panel [NHANES]    Frequency of Communication with Friends and Family: More than three times a week    Frequency of Social Gatherings with Friends and Family: Twice a week    Attends Religious Services: Never    Database administrator or Organizations: Yes    Attends Engineer, structural: More than 4 times per year    Marital Status: Married     Review of Systems  Constitutional:  Negative for appetite change and unexpected weight change.  HENT:  Negative for congestion and sinus pressure.   Respiratory:  Negative for cough, chest tightness and shortness of breath.   Cardiovascular:  Negative for chest pain, palpitations and leg swelling.  Gastrointestinal:  Negative for abdominal pain, diarrhea, nausea and vomiting.  Genitourinary:  Negative for difficulty urinating and dysuria.  Musculoskeletal:  Negative for joint swelling and myalgias.  Skin:  Negative for color change and rash.  Neurological:  Negative for dizziness and headaches.  Psychiatric/Behavioral:  Negative for agitation and dysphoric mood.        Increased stress as outlined.        Objective:     BP 132/72   Pulse 64   Temp (!) 97.5 F (36.4 C) (Oral)   Ht 5\' 4"  (1.626 m)   Wt 114 lb 9.6 oz (52 kg)   SpO2 100%   BMI 19.67 kg/m  Wt Readings from Last 3 Encounters:  04/01/23 114 lb 9.6 oz (52 kg)  03/10/23 113 lb 3 oz (51.3 kg)  11/27/22 114 lb 13.6 oz (52.1 kg)    Physical Exam Vitals reviewed.  Constitutional:      General: She is not in acute distress.    Appearance: Normal appearance.  HENT:     Head: Normocephalic and atraumatic.     Right Ear: External ear normal.     Left Ear: External ear normal.  Eyes:     General: No scleral icterus.       Right eye: No discharge.        Left eye: No discharge.     Conjunctiva/sclera: Conjunctivae normal.  Neck:     Thyroid: No thyromegaly.  Cardiovascular:     Rate and Rhythm: Normal rate and regular rhythm.   Pulmonary:     Effort: No respiratory distress.     Breath sounds: Normal breath sounds. No wheezing.  Abdominal:     General: Bowel sounds are normal.     Palpations: Abdomen is soft.     Tenderness: There is no abdominal tenderness.  Musculoskeletal:        General: No swelling or tenderness.     Cervical back: Neck supple. No tenderness.  Lymphadenopathy:  Cervical: No cervical adenopathy.  Skin:    Findings: No erythema or rash.  Neurological:     Mental Status: She is alert.  Psychiatric:        Mood and Affect: Mood normal.        Behavior: Behavior normal.      Outpatient Encounter Medications as of 04/01/2023  Medication Sig   eszopiclone (LUNESTA) 1 MG TABS tablet Take 1 tablet (1 mg total) by mouth at bedtime as needed for sleep. Take immediately before bedtime   aspirin 81 MG chewable tablet Chew 81 mg by mouth daily.   blood glucose meter kit and supplies KIT Dispense based on patient and insurance preference. Use up to four times daily as directed.   Coenzyme Q10 (CO Q 10 PO) Take 1 tablet by mouth daily at 6 (six) AM.   Cyanocobalamin (VITAMIN B12 PO) Take 2 tablets by mouth daily at 6 (six) AM. gummies   glucose blood (FREESTYLE LITE) test strip Use to check blood sugars three times daily. E11.65   Insulin Pen Needle (PEN NEEDLES) 33G X 4 MM MISC Use to inject Ozempic   levothyroxine (SYNTHROID) 50 MCG tablet Take 1 tablet (50 mcg total) by mouth daily.   losartan (COZAAR) 25 MG tablet TAKE 1 TABLET DAILY   MAGNESIUM PO Take 1 tablet by mouth daily at 6 (six) AM.   metFORMIN (GLUCOPHAGE-XR) 500 MG 24 hr tablet Take 2 tablets (1,000 mg total) by mouth daily. TAKE 2 TABLETS TWICE A DAY WITH A MEAL   metoprolol succinate (TOPROL-XL) 25 MG 24 hr tablet Take 1 tablet (25 mg total) by mouth 2 (two) times daily.   Multiple Vitamin (MULTIVITAMIN) tablet Take 1 tablet by mouth daily.   polycarbophil (FIBERCON) 625 MG tablet Take 625 mg by mouth daily.   Probiotic  Product (PROBIOTIC DAILY PO) Take 1 tablet by mouth daily at 6 (six) AM.   rosuvastatin (CRESTOR) 20 MG tablet Take 1 tablet (20 mg total) by mouth daily.   [DISCONTINUED] azelastine (ASTELIN) 0.1 % nasal spray Place 1 spray into both nostrils 2 (two) times daily. Use in each nostril as directed (Patient not taking: Reported on 03/10/2023)   [DISCONTINUED] eszopiclone (LUNESTA) 2 MG TABS tablet TAKE ONE TABLET BY MOUTH AT BEDTIME AS NEEDED FOR SLEEP   [DISCONTINUED] metFORMIN (GLUCOPHAGE-XR) 500 MG 24 hr tablet Take 2 tablets (1,000 mg total) by mouth 2 (two) times daily with a meal. TAKE 2 TABLETS TWICE A DAY WITH A MEAL (Patient taking differently: Take 1,000 mg by mouth 2 (two) times daily with a meal. TAKE 2 TABLETS am and one pm)   [DISCONTINUED] mupirocin ointment (BACTROBAN) 2 % Apply 1 Application topically 2 (two) times daily. (Patient not taking: Reported on 03/10/2023)   [DISCONTINUED] Omega-3 Fatty Acids (FISH OIL PO) Take 2 tablets by mouth as needed. (Patient not taking: Reported on 03/10/2023)   [DISCONTINUED] OZEMPIC, 0.25 OR 0.5 MG/DOSE, 2 MG/3ML SOPN INJECT 0.25 MG UNDER THE SKIN ONCE A WEEK   [DISCONTINUED] OZEMPIC, 0.25 OR 0.5 MG/DOSE, 2 MG/3ML SOPN Inject 0.5 mg into the skin once a week.   No facility-administered encounter medications on file as of 04/01/2023.     Lab Results  Component Value Date   WBC 5.3 03/27/2023   HGB 12.6 03/27/2023   HCT 37.8 03/27/2023   PLT 266.0 03/27/2023   GLUCOSE 101 (H) 03/27/2023   CHOL 132 03/27/2023   TRIG 64.0 03/27/2023   HDL 76.70 03/27/2023   LDLCALC 43  03/27/2023   ALT 16 03/27/2023   AST 25 03/27/2023   NA 134 (L) 03/27/2023   K 4.4 03/27/2023   CL 97 03/27/2023   CREATININE 0.85 03/27/2023   BUN 7 03/27/2023   CO2 30 03/27/2023   TSH 0.90 11/18/2022   HGBA1C 6.6 (H) 03/27/2023   MICROALBUR <0.7 11/18/2022    MM 3D SCREENING MAMMOGRAM BILATERAL BREAST  Result Date: 03/18/2023 CLINICAL DATA:  Screening. EXAM: DIGITAL  SCREENING BILATERAL MAMMOGRAM WITH TOMOSYNTHESIS AND CAD TECHNIQUE: Bilateral screening digital craniocaudal and mediolateral oblique mammograms were obtained. Bilateral screening digital breast tomosynthesis was performed. The images were evaluated with computer-aided detection. COMPARISON:  Previous exam(s). ACR Breast Density Category b: There are scattered areas of fibroglandular density. FINDINGS: There are no findings suspicious for malignancy. IMPRESSION: No mammographic evidence of malignancy. A result letter of this screening mammogram will be mailed directly to the patient. RECOMMENDATION: Screening mammogram in one year. (Code:[id number]-B-01Y) BI-RADS CATEGORY  1: Negative. Electronically Signed   By: Sherian Rein M.D.   On: 03/18/2023 12:30       Assessment & Plan:  Type 2 diabetes mellitus with hyperglycemia, without long-term current use of insulin (HCC) Assessment & Plan: On metformin - now 1500mg  per day.  On ozempic .25mg .  Tolerating.  Weight stable.  Feels having side effects to metformin as outlined.  Wants to decrease dose of metformin and increase ozempic.  Will change ozempic to .5mg  and decrease metformin to one per day.  Low carb diet and exercise.  Follow met b and a1c. A1c - 6.6. discussed the need to monitor for side effects of medication.    Orders: -     Hemoglobin A1c; Future  Hypercholesterolemia Assessment & Plan: Continue crestor.  Follow lipid panel and liver function tests.    Orders: -     Lipid panel; Future -     Hepatic function panel; Future -     Basic metabolic panel; Future  Estrogen deficiency -     DG Bone Density; Future  Colon cancer screening Assessment & Plan: Wants to hold on cologuard.  Request IFOB.   Orders: -     Fecal occult blood, imunochemical; Future  Diabetes mellitus without complication (HCC) -     metFORMIN HCl ER; Take 2 tablets (1,000 mg total) by mouth daily. TAKE 2 TABLETS TWICE A DAY WITH A MEAL  Stress Assessment &  Plan: Increased stress as outlined. Discussed.  Does not feel needs any further intervention at this time. Follow.    Raynaud's disease without gangrene Assessment & Plan: Tried amlodipine.  Did not tolerate.  Discussed other treatment options.  Discussed referral.  She wants to hold on referral. Follow.  Hold on any further intervention at this time.    Hypothyroidism, unspecified type Assessment & Plan: On thyroid replacement.  Follow tsh.    Hyponatremia Assessment & Plan: Follow sodium. Stable on recent check 134.    Hypertension, essential Assessment & Plan: On losartan.  Follow pressures.  Follow metabolic panel.    Difficulty sleeping Assessment & Plan: Has been on lunesta 2mg  q hs.  Discussed changing.  Decrease lunesta to 1mg .  Discussed trail of trazodone.  Follow.     Other orders -     Eszopiclone; Take 1 tablet (1 mg total) by mouth at bedtime as needed for sleep. Take immediately before bedtime  Dispense: 30 tablet; Refill: 1     Dale Alleghany, MD

## 2023-04-01 NOTE — Assessment & Plan Note (Signed)
Wants to hold on cologuard.  Request IFOB.

## 2023-04-01 NOTE — Assessment & Plan Note (Signed)
Tried amlodipine.  Did not tolerate.  Discussed other treatment options.  Discussed referral.  She wants to hold on referral. Follow.  Hold on any further intervention at this time.

## 2023-04-01 NOTE — Assessment & Plan Note (Signed)
On losartan.  Follow pressures.  Follow metabolic panel.

## 2023-04-01 NOTE — Assessment & Plan Note (Signed)
On thyroid replacement.  Follow tsh.  

## 2023-04-01 NOTE — Assessment & Plan Note (Signed)
Follow sodium. Stable on recent check 134.

## 2023-04-01 NOTE — Telephone Encounter (Signed)
Patient just stated her ozempic refill needs to be sent to [facility]. The address is [facility] - [place], [place] - [address] [place] [address] Phone: [phone]  Fax: [phone]

## 2023-04-01 NOTE — Assessment & Plan Note (Signed)
Has been on lunesta 2mg  q hs.  Discussed changing.  Decrease lunesta to 1mg .  Discussed trail of trazodone.  Follow.

## 2023-04-01 NOTE — Assessment & Plan Note (Signed)
Increased stress as outlined.  Discussed.  Does not feel needs any further intervention at this time.  Follow.  

## 2023-04-01 NOTE — Assessment & Plan Note (Signed)
On metformin - now 1500mg  per day.  On ozempic .25mg .  Tolerating.  Weight stable.  Feels having side effects to metformin as outlined.  Wants to decrease dose of metformin and increase ozempic.  Will change ozempic to .5mg  and decrease metformin to one per day.  Low carb diet and exercise.  Follow met b and a1c. A1c - 6.6. discussed the need to monitor for side effects of medication.

## 2023-04-05 ENCOUNTER — Encounter: Payer: Self-pay | Admitting: Internal Medicine

## 2023-04-06 ENCOUNTER — Other Ambulatory Visit: Payer: Self-pay

## 2023-04-06 DIAGNOSIS — E119 Type 2 diabetes mellitus without complications: Secondary | ICD-10-CM

## 2023-04-06 MED ORDER — FREESTYLE LITE TEST VI STRP: ORAL_STRIP | 3 refills | Status: DC

## 2023-04-06 NOTE — Telephone Encounter (Signed)
Ok to refill test strips.  Regarding the amlodipine, she took the medication previously and had intolerance.  I do not think that taking longer will help with her intolerance.  I can refer her to the specialist I had mentioned to her if she desires and continuing to have problems with her raynaud's.

## 2023-04-13 ENCOUNTER — Other Ambulatory Visit: Payer: Self-pay

## 2023-04-13 DIAGNOSIS — E119 Type 2 diabetes mellitus without complications: Secondary | ICD-10-CM

## 2023-04-13 MED ORDER — FREESTYLE LITE TEST VI STRP: ORAL_STRIP | 3 refills | Status: DC

## 2023-04-22 ENCOUNTER — Encounter: Payer: Self-pay | Admitting: Internal Medicine

## 2023-04-24 NOTE — Telephone Encounter (Signed)
Just received and reviewed.  Please call Donna Howell and let her know that I have been out of the office.  It appears when she sent this message that she had not had a bowel movement in 3 days.  Message was sent two days ago.  Please call her and confirm if she has had a bowel movement.  I am ok if she starts colace one per day, but if she has still not had a bowel movement, would recommend using a suppository.  If no response - an enema - to help get the bowels stimulated.

## 2023-04-24 NOTE — Telephone Encounter (Signed)
Pt has had a BM after using a stool softener. But states that it happened after doubling up on the Ozempic which she will be taking her next dose here shortly. Pt has been advised to call us if she had this issue again instead of sending a mychart message.

## 2023-04-24 NOTE — Telephone Encounter (Signed)
Reviewed note.  Bowel moved with stool softener. She is going to monitor symptoms.  Please call when we return to office and f/u with her to confirm doing ok.  If persistent problem with constipation, I want her to reduce her ozempic dose back down.

## 2023-04-27 NOTE — Telephone Encounter (Signed)
Called to f/u with pt. She is on her 3rd dose and constipation seems to be better this time. She is going to continue to monitor and let me know if we need to decrease her ozempic

## 2023-05-19 ENCOUNTER — Ambulatory Visit
Admission: RE | Admit: 2023-05-19 | Discharge: 2023-05-19 | Disposition: A | Discharge status: Home / Self Care | Payer: Medicare Other | Source: Ambulatory Visit | Attending: Internal Medicine | Admitting: Internal Medicine

## 2023-05-19 DIAGNOSIS — E2839 Other primary ovarian failure: Secondary | ICD-10-CM | POA: Diagnosis not present

## 2023-05-19 DIAGNOSIS — Z780 Asymptomatic menopausal state: Secondary | ICD-10-CM | POA: Diagnosis not present

## 2023-05-19 DIAGNOSIS — M810 Age-related osteoporosis without current pathological fracture: Secondary | ICD-10-CM | POA: Diagnosis not present

## 2023-05-21 ENCOUNTER — Encounter: Payer: Self-pay | Admitting: Internal Medicine

## 2023-05-22 NOTE — Telephone Encounter (Signed)
Ok for her to take - one per day.

## 2023-05-22 NOTE — Telephone Encounter (Signed)
Ok for her to take Nature Made Calcium 750mg  with D3 and K? She is in vit d3 2000 units q day

## 2023-05-29 ENCOUNTER — Ambulatory Visit: Payer: Medicare Other | Admitting: Internal Medicine

## 2023-05-29 NOTE — Progress Notes (Deleted)
Subjective:    Patient ID: Donna Howell, female    DOB: 1951-04-28, 72 y.o.   MRN: [id number]  Patient here for No chief complaint on file.   HPI Here as a work in Optometrist - work in to discuss recent bone denstiy.    Past Medical History:  Diagnosis Date   Anemia    Arthritis    Chicken pox    Diabetes (HCC)    Family history of adverse reaction to anesthesia    daughter coded during surgery and son had trouble breathing during surgery   Frequent headaches    GERD (gastroesophageal reflux disease)    Hay fever    Heart murmur    High blood pressure    History of bronchitis    Hypothyroidism    IBS (irritable bowel syndrome)    Liver cyst    Mitral valve regurgitation    Tricuspid regurgitation    Past Surgical History:  Procedure Laterality Date   APPENDECTOMY  1974   BREAST BIOPSY Left 01/16/2021   Affirm bx-"Ribbon" clip- PREDOMINANTLY BENIGN ADIPOSE TISSUE. - AREAS OF STROMAL FIBROSIS AND MAMMARY GLANDS WITH ATROPHIC AND APOCRINE CHANGES. - NEGATIVE FOR ATYPIA AND MALIGNANCY.   CERVICAL CERCLAGE     DIAGNOSTIC LAPAROSCOPY     SHOULDER ARTHROSCOPY WITH SUBACROMIAL DECOMPRESSION, ROTATOR CUFF REPAIR AND BICEP TENDON REPAIR Right 12/24/2021   Procedure: RIGHT SHOULDER ARTHROSCOPY WITH DEBRIDEMENT, DECOMPRESSION, ROTATOR CUFF RELEASE, AND BICEPS TENODESIS;  Surgeon: Christena Flake, MD;  Location: [facility] ORS;  Service: Orthopedics;  Laterality: Right;   TONSILLECTOMY AND ADENOIDECTOMY  1962   Family History  Problem Relation Age of Onset   Arthritis Mother    Heart disease Mother    Diabetes Mother    Stroke Father    Arthritis Sister    Breast cancer Sister 71   Alcoholism Brother    Arthritis Maternal Grandmother    Heart disease Maternal Grandmother    Diabetes Maternal Grandmother    Social History   Socioeconomic History   Marital status: Married    Spouse name: Not on file   Number of children: Not on file   Years of education: Not on file   Highest  education level: Not on file  Occupational History   Not on file  Tobacco Use   Smoking status: Never   Smokeless tobacco: Never  Vaping Use   Vaping status: Never Used  Substance and Sexual Activity   Alcohol use: No   Drug use: No   Sexual activity: Not on file  Other Topics Concern   Not on file  Social History Narrative   Not on file   Social Determinants of Health   Financial Resource Strain: Low Risk  (03/10/2023)   Overall Financial Resource Strain (CARDIA)    Difficulty of Paying Living Expenses: Not hard at all  Food Insecurity: No Food Insecurity (03/10/2023)   Hunger Vital Sign    Worried About Running Out of Food in the Last Year: Never true    Ran Out of Food in the Last Year: Never true  Transportation Needs: No Transportation Needs (03/10/2023)   PRAPARE - Administrator, Civil Service (Medical): No    Lack of Transportation (Non-Medical): No  Physical Activity: Sufficiently Active (03/10/2023)   Exercise Vital Sign    Days of Exercise per Week: 5 days    Minutes of Exercise per Session: 30 min  Stress: Stress Concern Present (03/10/2023)   [employer] of Occupational Health -  Occupational Stress Questionnaire    Feeling of Stress : To some extent  Social Connections: Moderately Integrated (03/10/2023)   Social Connection and Isolation Panel [NHANES]    Frequency of Communication with Friends and Family: More than three times a week    Frequency of Social Gatherings with Friends and Family: Twice a week    Attends Religious Services: Never    Database administrator or Organizations: Yes    Attends Engineer, structural: More than 4 times per year    Marital Status: Married     Review of Systems     Objective:     There were no vitals taken for this visit. Wt Readings from Last 3 Encounters:  04/01/23 114 lb 9.6 oz (52 kg)  03/10/23 113 lb 3 oz (51.3 kg)  11/27/22 114 lb 13.6 oz (52.1 kg)    Physical Exam   Outpatient  Encounter Medications as of 05/29/2023  Medication Sig   aspirin 81 MG chewable tablet Chew 81 mg by mouth daily.   blood glucose meter kit and supplies KIT Dispense based on patient and insurance preference. Use up to four times daily as directed.   Coenzyme Q10 (CO Q 10 PO) Take 1 tablet by mouth daily at 6 (six) AM.   Cyanocobalamin (VITAMIN B12 PO) Take 2 tablets by mouth daily at 6 (six) AM. gummies   eszopiclone (LUNESTA) 1 MG TABS tablet Take 1 tablet (1 mg total) by mouth at bedtime as needed for sleep. Take immediately before bedtime   glucose blood (FREESTYLE LITE) test strip Use to check blood sugars three times daily. E11.65   Insulin Pen Needle (PEN NEEDLES) 33G X 4 MM MISC Use to inject Ozempic   levothyroxine (SYNTHROID) 50 MCG tablet Take 1 tablet (50 mcg total) by mouth daily.   losartan (COZAAR) 25 MG tablet TAKE 1 TABLET DAILY   MAGNESIUM PO Take 1 tablet by mouth daily at 6 (six) AM.   metFORMIN (GLUCOPHAGE-XR) 500 MG 24 hr tablet Take 2 tablets (1,000 mg total) by mouth daily. TAKE 2 TABLETS TWICE A DAY WITH A MEAL   metoprolol succinate (TOPROL-XL) 25 MG 24 hr tablet Take 1 tablet (25 mg total) by mouth 2 (two) times daily.   Multiple Vitamin (MULTIVITAMIN) tablet Take 1 tablet by mouth daily.   OZEMPIC, 0.25 OR 0.5 MG/DOSE, 2 MG/3ML SOPN Inject 0.5 mg into the skin once a week.   polycarbophil (FIBERCON) 625 MG tablet Take 625 mg by mouth daily.   Probiotic Product (PROBIOTIC DAILY PO) Take 1 tablet by mouth daily at 6 (six) AM.   rosuvastatin (CRESTOR) 20 MG tablet Take 1 tablet (20 mg total) by mouth daily.   No facility-administered encounter medications on file as of 05/29/2023.     Lab Results  Component Value Date   WBC 5.3 03/27/2023   HGB 12.6 03/27/2023   HCT 37.8 03/27/2023   PLT 266.0 03/27/2023   GLUCOSE 101 (H) 03/27/2023   CHOL 132 03/27/2023   TRIG 64.0 03/27/2023   HDL 76.70 03/27/2023   LDLCALC 43 03/27/2023   ALT 16 03/27/2023   AST 25  03/27/2023   NA 134 (L) 03/27/2023   K 4.4 03/27/2023   CL 97 03/27/2023   CREATININE 0.85 03/27/2023   BUN 7 03/27/2023   CO2 30 03/27/2023   TSH 0.90 11/18/2022   HGBA1C 6.6 (H) 03/27/2023   MICROALBUR <0.7 11/18/2022    DG Bone Density  Result Date: 05/19/2023 EXAM: DUAL  X-RAY ABSORPTIOMETRY (DXA) FOR BONE MINERAL DENSITY IMPRESSION: Your patient Donna Howell completed a BMD test on 05/19/2023 using the [employer] DXA System (software version: 14.10) manufactured by [employer]. The following summarizes the results of our evaluation. Technologist: MTB PATIENT BIOGRAPHICAL: Name: Donna Howell, Donna Howell Patient ID: [id number] Birth Date: 1950/09/09 Height: 64.0 in. Gender: Female Exam Date: 05/19/2023 Weight: 114.6 lbs. Indications: Hypothyroid, Caucasian, Postmenopausal, Diabetic Fractures: Treatments: Multi-Vitamin, Levothyroxine, Vitamin D DENSITOMETRY RESULTS: Site          Region      Measured Date Measured Age WHO Classification Young Adult T-score BMD         %Change vs. Previous Significant Change (*) AP Spine L1-L2 05/19/2023 71.8 Normal -0.3 1.132 g/cm2 -1.7% - AP Spine L1-L2 09/08/2017 66.1 Normal -0.2 1.152 g/cm2 - - DualFemur Total Right 05/19/2023 71.8 Osteopenia -1.3 0.838 g/cm2 -5.1% Yes DualFemur Total Right 09/08/2017 66.1 Normal -1.0 0.883 g/cm2 - - DualFemur Total Mean 05/19/2023 71.8 Osteopenia -1.1 0.872 g/cm2 -4.0% Yes DualFemur Total Mean 09/08/2017 66.1 Normal -0.8 0.908 g/cm2 - - Right Forearm Radius 33% 05/19/2023 71.8 Osteoporosis -3.4 0.575 g/cm2 -16.5% Yes Right Forearm Radius 33% 09/08/2017 66.1 Osteopenia -2.1 0.689 g/cm2 - - ASSESSMENT: The BMD measured at Forearm Radius 33% is 0.575 g/cm2 with a T-score of -3.4. This patient is considered osteoporotic according to World Health Organization [facility]) criteria. The scan quality is good. L-3 and L-4 were excluded due to degenerative changes. Compared with prior study, there has been no significant change in the  spine. Compared with prior study, there has been significant decrease in the total hip. World Science writer [facility]) criteria for post-menopausal, Caucasian Women: Normal:                   T-score at or above -1 SD Osteopenia/low bone mass: T-score between -1 and -2.5 SD Osteoporosis:             T-score at or below -2.5 SD RECOMMENDATIONS: 1. All patients should optimize calcium and vitamin D intake. 2. Consider FDA-approved medical therapies in postmenopausal women and men aged 27 years and older, based on the following: a. A hip or vertebral(clinical or morphometric) fracture b. T-score < -2.5 at the femoral neck or spine after appropriate evaluation to exclude secondary causes c. Low bone mass (T-score between -1.0 and -2.5 at the femoral neck or spine) and a 10-year probability of a hip fracture > 3% or a 10-year probability of a major osteoporosis-related fracture > 20% based on the [place]-adapted WHO algorithm 3. Clinician judgment and/or patient preferences may indicate treatment for people with 10-year fracture probabilities above or below these levels FOLLOW-UP: People with diagnosed cases of osteoporosis or at high risk for fracture should have regular bone mineral density tests. For patients eligible for Medicare, routine testing is allowed once every 2 years. The testing frequency can be increased to one year for patients who have rapidly progressing disease, those who are receiving or discontinuing medical therapy to restore bone mass, or have additional risk factors. I have reviewed this report, and agree with the above findings. [facility] Electronically Signed   By: Frederico Hamman M.D.   On: 05/19/2023 08:38       Assessment & Plan:  There are no diagnoses linked to this encounter.   Dale Carlock, MD

## 2023-05-31 ENCOUNTER — Encounter: Payer: Self-pay | Admitting: Internal Medicine

## 2023-06-01 ENCOUNTER — Encounter: Payer: Self-pay | Admitting: Internal Medicine

## 2023-06-01 ENCOUNTER — Ambulatory Visit (INDEPENDENT_AMBULATORY_CARE_PROVIDER_SITE_OTHER): Payer: Medicare Other

## 2023-06-01 ENCOUNTER — Telehealth (INDEPENDENT_AMBULATORY_CARE_PROVIDER_SITE_OTHER): Payer: Medicare Other | Admitting: Internal Medicine

## 2023-06-01 DIAGNOSIS — M810 Age-related osteoporosis without current pathological fracture: Secondary | ICD-10-CM

## 2023-06-01 DIAGNOSIS — I10 Essential (primary) hypertension: Secondary | ICD-10-CM | POA: Diagnosis not present

## 2023-06-01 DIAGNOSIS — Z1211 Encounter for screening for malignant neoplasm of colon: Secondary | ICD-10-CM | POA: Diagnosis not present

## 2023-06-01 LAB — FECAL OCCULT BLOOD, IMMUNOCHEMICAL: Fecal Occult Bld: NEGATIVE

## 2023-06-01 MED ORDER — ESZOPICLONE 1 MG PO TABS: 1.0000 mg | ORAL_TABLET | Freq: Every evening | ORAL | 1 refills | Status: DC | PRN

## 2023-06-01 MED ORDER — ALENDRONATE SODIUM 70 MG PO TABS: 70.0000 mg | ORAL_TABLET | ORAL | 1 refills | Status: DC

## 2023-06-01 NOTE — Progress Notes (Signed)
Patient ID: Donna Howell, female   DOB: [birth year], 72 y.o.   MRN: [id number]   Virtual Visit via video Note  I connected with Donna Howell by a video enabled telemedicine application and verified that I am speaking with the correct person using two identifiers. Location patient: home Location provider: work  Persons participating in the virtual visit: patient, provider  The limitations, risks, security and privacy concerns of performing an evaluation and management service by video and the availability of in person appointments have been discussed.  It has also been discussed with the patient that there may be a patient responsible charge related to this service. The patient expressed understanding and agreed to proceed.   Reason for visit: work in appt  HPI: Work in to discuss bone density and treatment options. Discussed recent bone density results and comparison bone density. Osteoporosis - radius. Discussed treatment options - including oral bisphosphonates, reclast, prolia. Discussed possible side effects of medication.  Discussed calcium and vitamin D.    ROS: See pertinent positives and negatives per HPI.  Past Medical History:  Diagnosis Date   Anemia    Arthritis    Chicken pox    Diabetes (HCC)    Family history of adverse reaction to anesthesia    daughter coded during surgery and son had trouble breathing during surgery   Frequent headaches    GERD (gastroesophageal reflux disease)    Hay fever    Heart murmur    High blood pressure    History of bronchitis    Hypothyroidism    IBS (irritable bowel syndrome)    Liver cyst    Mitral valve regurgitation    Tricuspid regurgitation     Past Surgical History:  Procedure Laterality Date   APPENDECTOMY  1974   BREAST BIOPSY Left 01/16/2021   Affirm bx-"Ribbon" clip- PREDOMINANTLY BENIGN ADIPOSE TISSUE. - AREAS OF STROMAL FIBROSIS AND MAMMARY GLANDS WITH ATROPHIC AND APOCRINE CHANGES. - NEGATIVE FOR ATYPIA AND  MALIGNANCY.   CERVICAL CERCLAGE     DIAGNOSTIC LAPAROSCOPY     SHOULDER ARTHROSCOPY WITH SUBACROMIAL DECOMPRESSION, ROTATOR CUFF REPAIR AND BICEP TENDON REPAIR Right 12/24/2021   Procedure: RIGHT SHOULDER ARTHROSCOPY WITH DEBRIDEMENT, DECOMPRESSION, ROTATOR CUFF RELEASE, AND BICEPS TENODESIS;  Surgeon: Christena Flake, MD;  Location: [facility] ORS;  Service: Orthopedics;  Laterality: Right;   TONSILLECTOMY AND ADENOIDECTOMY  1962    Family History  Problem Relation Age of Onset   Arthritis Mother    Heart disease Mother    Diabetes Mother    Stroke Father    Arthritis Sister    Breast cancer Sister 82   Alcoholism Brother    Arthritis Maternal Grandmother    Heart disease Maternal Grandmother    Diabetes Maternal Grandmother     SOCIAL HX: reviewed.    Current Outpatient Medications:    alendronate (FOSAMAX) 70 MG tablet, Take 1 tablet (70 mg total) by mouth every 7 (seven) days. Take with a full glass of water on an empty stomach., Disp: 4 tablet, Rfl: 1   aspirin 81 MG chewable tablet, Chew 81 mg by mouth daily., Disp: , Rfl:    blood glucose meter kit and supplies KIT, Dispense based on patient and insurance preference. Use up to four times daily as directed., Disp: 1 each, Rfl: 3   Coenzyme Q10 (CO Q 10 PO), Take 1 tablet by mouth daily at 6 (six) AM., Disp: , Rfl:    Cyanocobalamin (VITAMIN B12 PO), Take 2 tablets by mouth  daily at 6 (six) AM. gummies, Disp: , Rfl:    eszopiclone (LUNESTA) 1 MG TABS tablet, Take 1 tablet (1 mg total) by mouth at bedtime as needed for sleep. Take immediately before bedtime, Disp: 30 tablet, Rfl: 1   glucose blood (FREESTYLE LITE) test strip, Use to check blood sugars three times daily. E11.65, Disp: 100 strip, Rfl: 3   Insulin Pen Needle (PEN NEEDLES) 33G X 4 MM MISC, Use to inject Ozempic, Disp: 100 each, Rfl: 3   levothyroxine (SYNTHROID) 50 MCG tablet, Take 1 tablet (50 mcg total) by mouth daily., Disp: 90 tablet, Rfl: 3   losartan (COZAAR) 25 MG  tablet, TAKE 1 TABLET DAILY, Disp: 90 tablet, Rfl: 3   MAGNESIUM PO, Take 1 tablet by mouth daily at 6 (six) AM., Disp: , Rfl:    metFORMIN (GLUCOPHAGE-XR) 500 MG 24 hr tablet, Take 2 tablets (1,000 mg total) by mouth daily. TAKE 2 TABLETS TWICE A DAY WITH A MEAL, Disp: , Rfl:    metoprolol succinate (TOPROL-XL) 25 MG 24 hr tablet, Take 1 tablet (25 mg total) by mouth 2 (two) times daily., Disp: 180 tablet, Rfl: 3   Multiple Vitamin (MULTIVITAMIN) tablet, Take 1 tablet by mouth daily., Disp: , Rfl:    OZEMPIC, 0.25 OR 0.5 MG/DOSE, 2 MG/3ML SOPN, Inject 0.5 mg into the skin once a week., Disp: 3 mL, Rfl: 5   polycarbophil (FIBERCON) 625 MG tablet, Take 625 mg by mouth daily., Disp: , Rfl:    Probiotic Product (PROBIOTIC DAILY PO), Take 1 tablet by mouth daily at 6 (six) AM., Disp: , Rfl:    rosuvastatin (CRESTOR) 20 MG tablet, Take 1 tablet (20 mg total) by mouth daily., Disp: 90 tablet, Rfl: 3  EXAM:  GENERAL: alert, oriented, appears well and in no acute distress  HEENT: atraumatic, conjunttiva clear, no obvious abnormalities on inspection of external nose and ears  NECK: normal movements of the head and neck  LUNGS: on inspection no signs of respiratory distress, breathing rate appears normal, no obvious gross SOB, gasping or wheezing  CV: no obvious cyanosis  PSYCH/NEURO: pleasant and cooperative, no obvious depression or anxiety, speech and thought processing grossly intact  ASSESSMENT AND PLAN:  Discussed the following assessment and plan:  Problem List Items Addressed This Visit     Hypertension, essential    On losartan.  Follow pressures.  Follow metabolic panel.       Osteoporosis - Primary    Discussed osteoporosis and recent bone density results.  Discussed treatment options as outlined. Discussed oral bisphosphonates, reclast and prolia.  Discussed possible side effects of the medication.  Discussed her future dental procedures.  She elected to start fosamax.  Continue  calcium and vitamin D.       Relevant Medications   alendronate (FOSAMAX) 70 MG tablet    Return if symptoms worsen or fail to improve.   I discussed the assessment and treatment plan with the patient. The patient was provided an opportunity to ask questions and all were answered. The patient agreed with the plan and demonstrated an understanding of the instructions.   The patient was advised to call back or seek an in-person evaluation if the symptoms worsen or if the condition fails to improve as anticipated.   Dale [place], MD

## 2023-06-01 NOTE — Telephone Encounter (Signed)
 Virtual scheduled to discuss.

## 2023-06-06 ENCOUNTER — Encounter: Payer: Self-pay | Admitting: Internal Medicine

## 2023-06-06 DIAGNOSIS — M810 Age-related osteoporosis without current pathological fracture: Secondary | ICD-10-CM | POA: Insufficient documentation

## 2023-06-06 NOTE — Assessment & Plan Note (Signed)
On losartan.  Follow pressures.  Follow metabolic panel.

## 2023-06-06 NOTE — Assessment & Plan Note (Signed)
Discussed osteoporosis and recent bone density results.  Discussed treatment options as outlined. Discussed oral bisphosphonates, reclast and prolia.  Discussed possible side effects of the medication.  Discussed her future dental procedures.  She elected to start fosamax.  Continue calcium and vitamin D.

## 2023-06-15 ENCOUNTER — Ambulatory Visit: Payer: Medicare Other | Admitting: Medical

## 2023-06-29 ENCOUNTER — Encounter: Payer: Self-pay | Admitting: Medical

## 2023-06-29 ENCOUNTER — Ambulatory Visit: Payer: Medicare Other | Attending: Medical | Admitting: Medical

## 2023-06-29 VITALS — BP 100/60 | HR 65 | Ht 64.0 in | Wt 116.5 lb

## 2023-06-29 DIAGNOSIS — I493 Ventricular premature depolarization: Secondary | ICD-10-CM | POA: Diagnosis not present

## 2023-06-29 DIAGNOSIS — I38 Endocarditis, valve unspecified: Secondary | ICD-10-CM | POA: Diagnosis not present

## 2023-06-29 DIAGNOSIS — I7301 Raynaud's syndrome with gangrene: Secondary | ICD-10-CM | POA: Diagnosis not present

## 2023-06-29 DIAGNOSIS — I10 Essential (primary) hypertension: Secondary | ICD-10-CM | POA: Diagnosis not present

## 2023-06-29 DIAGNOSIS — E782 Mixed hyperlipidemia: Secondary | ICD-10-CM | POA: Diagnosis not present

## 2023-06-29 MED ORDER — AMLODIPINE BESYLATE 5 MG PO TABS: 5.0000 mg | ORAL_TABLET | Freq: Every day | ORAL | 3 refills | Status: DC

## 2023-06-29 NOTE — Progress Notes (Signed)
Cardiology Office Note:    Date:  06/29/2023   ID:  Donna Howell, DOB Nov 17, 1950, MRN [id number]  PCP:  [facility], MD  [facility] Cardiologist:  None  [facility] Electrophysiologist:  None   Referring MD: Dale [place], MD   Chief Complaint: 1 year follow-up  History of Present Illness:    Donna Howell is a 72 y.o. female with a hx of DM2, moderate TR, HLD, HTN, mild LVH, diastolic dysfunction who is being seen for 1 year follow-up.   Echo in May 2022 showed normal LVSF, normal RVSF, mild MR/TR  The patient was seen as a new patient 08/2021 by Dr. Mariah Milling. She was a former Dr. Lady Howell patient. She was overall stable from a cardiac perspective.   Today, the patient is overall doing well. She reports minimal shortness of breath on exertion. She reports severe raynaud's. She said amlodipine caused headache in the past, but is willing to try it again. She denies chest pain, lower leg edema.    Past Medical History:  Diagnosis Date   Anemia    Arthritis    Chicken pox    Diabetes (HCC)    Family history of adverse reaction to anesthesia    daughter coded during surgery and son had trouble breathing during surgery   Frequent headaches    GERD (gastroesophageal reflux disease)    Hay fever    Heart murmur    High blood pressure    History of bronchitis    Hypothyroidism    IBS (irritable bowel syndrome)    Liver cyst    Mitral valve regurgitation    Tricuspid regurgitation     Past Surgical History:  Procedure Laterality Date   APPENDECTOMY  1974   BREAST BIOPSY Left 01/16/2021   Affirm bx-"Ribbon" clip- PREDOMINANTLY BENIGN ADIPOSE TISSUE. - AREAS OF STROMAL FIBROSIS AND MAMMARY GLANDS WITH ATROPHIC AND APOCRINE CHANGES. - NEGATIVE FOR ATYPIA AND MALIGNANCY.   CERVICAL CERCLAGE     DIAGNOSTIC LAPAROSCOPY     SHOULDER ARTHROSCOPY WITH SUBACROMIAL DECOMPRESSION, ROTATOR CUFF REPAIR AND BICEP TENDON REPAIR Right 12/24/2021   Procedure: RIGHT SHOULDER  ARTHROSCOPY WITH DEBRIDEMENT, DECOMPRESSION, ROTATOR CUFF RELEASE, AND BICEPS TENODESIS;  Surgeon: Christena Flake, MD;  Location: [facility] ORS;  Service: Orthopedics;  Laterality: Right;   TONSILLECTOMY AND ADENOIDECTOMY  1962    Current Medications: Current Meds  Medication Sig   alendronate (FOSAMAX) 70 MG tablet Take 1 tablet (70 mg total) by mouth every 7 (seven) days. Take with a full glass of water on an empty stomach.   amLODipine (NORVASC) 5 MG tablet Take 1 tablet (5 mg total) by mouth daily.   aspirin 81 MG chewable tablet Chew 81 mg by mouth daily.   blood glucose meter kit and supplies KIT Dispense based on patient and insurance preference. Use up to four times daily as directed.   Coenzyme Q10 (CO Q 10 PO) Take 1 tablet by mouth daily at 6 (six) AM.   Cyanocobalamin (VITAMIN B12 PO) Take 2 tablets by mouth daily at 6 (six) AM. gummies   eszopiclone (LUNESTA) 1 MG TABS tablet Take 1 tablet (1 mg total) by mouth at bedtime as needed for sleep. Take immediately before bedtime   glucose blood (FREESTYLE LITE) test strip Use to check blood sugars three times daily. E11.65   Insulin Pen Needle (PEN NEEDLES) 33G X 4 MM MISC Use to inject Ozempic   levothyroxine (SYNTHROID) 50 MCG tablet Take 1 tablet (50 mcg total) by mouth  daily.   MAGNESIUM PO Take 1 tablet by mouth daily at 6 (six) AM.   metFORMIN (GLUCOPHAGE-XR) 500 MG 24 hr tablet Take 2 tablets (1,000 mg total) by mouth daily. TAKE 2 TABLETS TWICE A DAY WITH A MEAL (Patient taking differently: Take 1,000 mg by mouth daily.)   metoprolol succinate (TOPROL-XL) 25 MG 24 hr tablet Take 1 tablet (25 mg total) by mouth 2 (two) times daily.   Multiple Vitamin (MULTIVITAMIN) tablet Take 1 tablet by mouth daily.   OZEMPIC, 0.25 OR 0.5 MG/DOSE, 2 MG/3ML SOPN Inject 0.5 mg into the skin once a week.   polycarbophil (FIBERCON) 625 MG tablet Take 625 mg by mouth daily.   Probiotic Product (PROBIOTIC DAILY PO) Take 1 tablet by mouth daily at 6 (six)  AM.   rosuvastatin (CRESTOR) 20 MG tablet Take 1 tablet (20 mg total) by mouth daily.   [DISCONTINUED] losartan (COZAAR) 25 MG tablet TAKE 1 TABLET DAILY     Allergies:   Contrast media [iodinated contrast media], Buspirone hcl, Penicillins, Percocet [oxycodone-acetaminophen], and Percodan [oxycodone-aspirin]   Social History   Socioeconomic History   Marital status: Married    Spouse name: Not on file   Number of children: Not on file   Years of education: Not on file   Highest education level: Not on file  Occupational History   Not on file  Tobacco Use   Smoking status: Never   Smokeless tobacco: Never  Vaping Use   Vaping status: Never Used  Substance and Sexual Activity   Alcohol use: No   Drug use: No   Sexual activity: Not on file  Other Topics Concern   Not on file  Social History Narrative   Not on file   Social Determinants of Health   Financial Resource Strain: Low Risk  (03/10/2023)   Overall Financial Resource Strain (CARDIA)    Difficulty of Paying Living Expenses: Not hard at all  Food Insecurity: No Food Insecurity (03/10/2023)   Hunger Vital Sign    Worried About Running Out of Food in the Last Year: Never true    Ran Out of Food in the Last Year: Never true  Transportation Needs: No Transportation Needs (03/10/2023)   PRAPARE - Administrator, Civil Service (Medical): No    Lack of Transportation (Non-Medical): No  Physical Activity: Sufficiently Active (03/10/2023)   Exercise Vital Sign    Days of Exercise per Week: 5 days    Minutes of Exercise per Session: 30 min  Stress: Stress Concern Present (03/10/2023)   [employer] of Occupational Health - Occupational Stress Questionnaire    Feeling of Stress : To some extent  Social Connections: Moderately Integrated (03/10/2023)   Social Connection and Isolation Panel [NHANES]    Frequency of Communication with Friends and Family: More than three times a week    Frequency of Social  Gatherings with Friends and Family: Twice a week    Attends Religious Services: Never    Database administrator or Organizations: Yes    Attends Engineer, structural: More than 4 times per year    Marital Status: Married     Family History: The patient's family history includes Alcoholism in her brother; Arthritis in her maternal grandmother, mother, and sister; Breast cancer (age of onset: 28) in her sister; Diabetes in her maternal grandmother and mother; Heart disease in her maternal grandmother and mother; Stroke in her father.  ROS:   Please see the history of  present illness.     All other systems reviewed and are negative.  EKGs/Labs/Other Studies Reviewed:    The following studies were reviewed today:  Echo 2019 Left ventricle: The cavity size was normal. Systolic function was    normal. The estimated ejection fraction was in the range of 60%    to 65%. Wall motion was normal; there were no regional wall    motion abnormalities. Features are consistent with a pseudonormal    left ventricular filling pattern, with concomitant abnormal    relaxation and increased filling pressure (grade 2 diastolic    dysfunction).  - Mitral valve: There was mild regurgitation.  - Left atrium: The atrium was mildly dilated.  - Right ventricle: Systolic function was normal.  - Tricuspid valve: There was moderate-severe regurgitation.  - Pulmonary arteries: Systolic pressure was minimally elevated PA    peak pressure: 32 mm Hg (S).    EKG:  EKG is  ordered today.  The ekg ordered today demonstrates NSR 65bpm, nonspecific t wave changes  Recent Labs: 11/18/2022: TSH 0.90 03/27/2023: ALT 16; BUN 7; Creatinine, Ser 0.85; Hemoglobin 12.6; Platelets 266.0; Potassium 4.4; Sodium 134  Recent Lipid Panel    Component Value Date/Time   CHOL 132 03/27/2023 0745   TRIG 64.0 03/27/2023 0745   HDL 76.70 03/27/2023 0745   CHOLHDL 2 03/27/2023 0745   VLDL 12.8 03/27/2023 0745   LDLCALC 43  03/27/2023 0745      Physical Exam:    VS:  BP 100/60 (BP Location: Left Arm, Patient Position: Sitting, Cuff Size: Normal)   Pulse 65   Ht 5\' 4"  (1.626 m)   Wt 116 lb 8 oz (52.8 kg)   SpO2 97%   BMI 20.00 kg/m     Wt Readings from Last 3 Encounters:  06/29/23 116 lb 8 oz (52.8 kg)  04/01/23 114 lb 9.6 oz (52 kg)  03/10/23 113 lb 3 oz (51.3 kg)     GEN:  Well nourished, well developed in no acute distress HEENT: Normal NECK: No JVD; No carotid bruits LYMPHATICS: No lymphadenopathy CARDIAC: RRR, no murmurs, rubs, gallops RESPIRATORY:  Clear to auscultation without rales, wheezing or rhonchi  ABDOMEN: Soft, non-tender, non-distended MUSCULOSKELETAL:  No edema; No deformity  SKIN: Warm and dry NEUROLOGIC:  Alert and oriented x 3 PSYCHIATRIC:  Normal affect   ASSESSMENT:    1. Valvular heart disease   2. Mixed hyperlipidemia   3. PVC (premature ventricular contraction)   4. Raynaud disease with gangrene (HCC)   5. Hypertension, essential    PLAN:    In order of problems listed above:  Valvular disease Outside echo showed moderate TR.  She has no significant symptoms.  No murmur on exam.  I will repeat an echocardiogram.  Hyperlipidemia LDL 43. Continue Crestor 20 mg daily.  PVCs Seen on prior EKG.  She is asymptomatic.  Continue Toprol 25 mg twice daily.  Raynaud's disease Patient reports severe Raynaud's.  She said she tried amlodipine in the past, but this caused a headache but is willing to try again.  Blood pressure is soft today.  I will stop losartan and start amlodipine 5 mg daily.  HTN Blood pressure borderline today.  Stop losartan and start amlodipine as above.  Continue Toprol.  Disposition: Follow up in 3 month(s) with MD/APP    Signed, Deyanira Fesler David Stall, PA-C  06/29/2023 10:52 AM     [facility]

## 2023-06-29 NOTE — Patient Instructions (Signed)
Medication Instructions:  Your physician recommends the following medication changes.  STOP TAKING: Losartan   START TAKING: Amlodipine 5 mg by mouth   *If you need a refill on your cardiac medications before your next appointment, please call your pharmacy*   Lab Work: No labs ordered today    Testing/Procedures: Your physician has requested that you have an echocardiogram. Echocardiography is a painless test that uses sound waves to create images of your heart. It provides your doctor with information about the size and shape of your heart and how well your heart's chambers and valves are working.   You may receive an ultrasound enhancing agent through an IV if needed to better visualize your heart during the echo. This procedure takes approximately one hour.  There are no restrictions for this procedure.  This will take place at [address] [facility] [address], [place] [address]  Please note: We ask at that you not bring children with you during ultrasound (echo/ vascular) testing. Due to room size and safety concerns, children are not allowed in the ultrasound rooms during exams. Our front office staff cannot provide observation of children in our lobby area while testing is being conducted. An adult accompanying a patient to their appointment will only be allowed in the ultrasound room at the discretion of the ultrasound technician under special circumstances. We apologize for any inconvenience.    Follow-Up: At [facility], you and your health needs are our priority.  As part of our continuing mission to provide you with exceptional heart care, we have created designated Provider Care Teams.  These Care Teams include your primary Cardiologist (physician) and Advanced Practice Providers (APPs -  Physician Assistants and Nurse Practitioners) who all work together to provide you with the care you need, when you need it.  We recommend signing up for the  patient portal called "MyChart".  Sign up information is provided on this After Visit Summary.  MyChart is used to connect with patients for Virtual Visits (Telemedicine).  Patients are able to view lab/test results, encounter notes, upcoming appointments, etc.  Non-urgent messages can be sent to your provider as well.   To learn more about what you can do with MyChart, go to [email or web address].    Your next appointment:   3 month(s)  Provider:   Terrilee Croak, PA-C

## 2023-07-01 ENCOUNTER — Other Ambulatory Visit: Payer: Self-pay | Admitting: Internal Medicine

## 2023-07-01 ENCOUNTER — Encounter: Payer: Self-pay | Admitting: Internal Medicine

## 2023-07-01 NOTE — Telephone Encounter (Signed)
I am ok with her changing the medication as cardiology suggested.  She can spot check her pressure.  Let us (or cardiology) know if any problems.

## 2023-07-01 NOTE — Telephone Encounter (Signed)
Patient saw cardiology and they discussed d/c losartan, start amlodipine 5 mg q day and continue her toprol. She just wanted to make you aware and make sure this is ok with you. She knows you are out of office.

## 2023-07-10 ENCOUNTER — Other Ambulatory Visit: Payer: Self-pay | Admitting: *Deleted

## 2023-07-10 MED ORDER — AMLODIPINE BESYLATE 5 MG PO TABS: 5.0000 mg | ORAL_TABLET | Freq: Every day | ORAL | 0 refills | Status: DC

## 2023-07-15 ENCOUNTER — Other Ambulatory Visit: Payer: Medicare Other

## 2023-07-15 DIAGNOSIS — E7800 Pure hypercholesterolemia, unspecified: Secondary | ICD-10-CM

## 2023-07-15 DIAGNOSIS — E1165 Type 2 diabetes mellitus with hyperglycemia: Secondary | ICD-10-CM

## 2023-07-15 LAB — BASIC METABOLIC PANEL
BUN: 10 mg/dL (ref 6–23)
CO2: 31 meq/L (ref 19–32)
Calcium: 9.2 mg/dL (ref 8.4–10.5)
Chloride: 100 meq/L (ref 96–112)
Creatinine, Ser: 0.91 mg/dL (ref 0.40–1.20)
GFR: 63.25 mL/min (ref >60.00)
Glucose, Bld: 110 mg/dL — ABNORMAL HIGH (ref 70–99)
Potassium: 4.4 meq/L (ref 3.5–5.1)
Sodium: 136 meq/L (ref 135–145)

## 2023-07-15 LAB — HEMOGLOBIN A1C: Hgb A1c MFr Bld: 6.7 % — ABNORMAL HIGH (ref 4.6–6.5)

## 2023-07-15 LAB — LIPID PANEL
Cholesterol: 126 mg/dL (ref 0–200)
HDL: 78.5 mg/dL (ref >39.00)
LDL Cholesterol: 35 mg/dL (ref 0–99)
NonHDL: 47.34
Total CHOL/HDL Ratio: 2
Triglycerides: 63 mg/dL (ref 0.0–149.0)
VLDL: 12.6 mg/dL (ref 0.0–40.0)

## 2023-07-15 LAB — HEPATIC FUNCTION PANEL
ALT: 15 U/L (ref 0–35)
AST: 23 U/L (ref 0–37)
Albumin: 4.3 g/dL (ref 3.5–5.2)
Alkaline Phosphatase: 55 U/L (ref 39–117)
Bilirubin, Direct: 0.2 mg/dL (ref 0.0–0.3)
Total Bilirubin: 0.6 mg/dL (ref 0.2–1.2)
Total Protein: 6.8 g/dL (ref 6.0–8.3)

## 2023-07-17 ENCOUNTER — Ambulatory Visit (INDEPENDENT_AMBULATORY_CARE_PROVIDER_SITE_OTHER): Payer: Medicare Other | Admitting: Internal Medicine

## 2023-07-17 ENCOUNTER — Encounter: Payer: Self-pay | Admitting: Internal Medicine

## 2023-07-17 VITALS — BP 110/68 | HR 60 | Temp 97.9°F | Resp 16 | Ht 64.0 in | Wt 117.0 lb

## 2023-07-17 DIAGNOSIS — E7800 Pure hypercholesterolemia, unspecified: Secondary | ICD-10-CM | POA: Diagnosis not present

## 2023-07-17 DIAGNOSIS — F439 Reaction to severe stress, unspecified: Secondary | ICD-10-CM | POA: Diagnosis not present

## 2023-07-17 DIAGNOSIS — E1165 Type 2 diabetes mellitus with hyperglycemia: Secondary | ICD-10-CM | POA: Diagnosis not present

## 2023-07-17 DIAGNOSIS — Z7984 Long term (current) use of oral hypoglycemic drugs: Secondary | ICD-10-CM | POA: Diagnosis not present

## 2023-07-17 DIAGNOSIS — Z0000 Encounter for general adult medical examination without abnormal findings: Secondary | ICD-10-CM

## 2023-07-17 DIAGNOSIS — E039 Hypothyroidism, unspecified: Secondary | ICD-10-CM | POA: Diagnosis not present

## 2023-07-17 DIAGNOSIS — M810 Age-related osteoporosis without current pathological fracture: Secondary | ICD-10-CM | POA: Diagnosis not present

## 2023-07-17 DIAGNOSIS — I7300 Raynaud's syndrome without gangrene: Secondary | ICD-10-CM | POA: Diagnosis not present

## 2023-07-17 DIAGNOSIS — I10 Essential (primary) hypertension: Secondary | ICD-10-CM

## 2023-07-17 DIAGNOSIS — I071 Rheumatic tricuspid insufficiency: Secondary | ICD-10-CM | POA: Diagnosis not present

## 2023-07-17 DIAGNOSIS — E119 Type 2 diabetes mellitus without complications: Secondary | ICD-10-CM

## 2023-07-17 MED ORDER — METFORMIN HCL ER 500 MG PO TB24: ORAL_TABLET | ORAL | 3 refills | Status: DC

## 2023-07-17 NOTE — Progress Notes (Unsigned)
Subjective:    Patient ID: Donna Howell, female    DOB: February 16, 1951, 72 y.o.   MRN: [id number]  Patient here for  Chief Complaint  Patient presents with   Medical Management of Chronic Issues    HPI Here for a physical exam. Changed to f/u. Had f/u with cardiology 06/29/23 - ECHO moderate TR.  Recommended f/u echo - scheduled for 08/04/23. Discussed her raynaud's.  They recommended her stop losartan and start amlodipine.  Also recently started fosamax. Has continued on metformin and ozempic. Was having side effects to metformin.  Metformin decreased to q day dosing and ozempic increased to .5mg . She feels now that she is not tolerating the ozempic. Feels she is having to eat more to try to keep her weight up. Some nausea. She wants to stop ozempic and increase metformin.    Past Medical History:  Diagnosis Date   Anemia    Arthritis    Chicken pox    Diabetes (HCC)    Family history of adverse reaction to anesthesia    daughter coded during surgery and son had trouble breathing during surgery   Frequent headaches    GERD (gastroesophageal reflux disease)    Hay fever    Heart murmur    High blood pressure    History of bronchitis    Hypothyroidism    IBS (irritable bowel syndrome)    Liver cyst    Mitral valve regurgitation    Tricuspid regurgitation    Past Surgical History:  Procedure Laterality Date   APPENDECTOMY  1974   BREAST BIOPSY Left 01/16/2021   Affirm bx-"Ribbon" clip- PREDOMINANTLY BENIGN ADIPOSE TISSUE. - AREAS OF STROMAL FIBROSIS AND MAMMARY GLANDS WITH ATROPHIC AND APOCRINE CHANGES. - NEGATIVE FOR ATYPIA AND MALIGNANCY.   CERVICAL CERCLAGE     DIAGNOSTIC LAPAROSCOPY     SHOULDER ARTHROSCOPY WITH SUBACROMIAL DECOMPRESSION, ROTATOR CUFF REPAIR AND BICEP TENDON REPAIR Right 12/24/2021   Procedure: RIGHT SHOULDER ARTHROSCOPY WITH DEBRIDEMENT, DECOMPRESSION, ROTATOR CUFF RELEASE, AND BICEPS TENODESIS;  Surgeon: Christena Flake, MD;  Location: [facility] OR[facility];  Service:  Orthopedics;  Laterality: Right;   TONSILLECTOMY AND ADENOIDECTOMY  1962   Family History  Problem Relation Age of Onset   Arthritis Mother    Heart disease Mother    Diabetes Mother    Stroke Father    Arthritis Sister    Breast cancer Sister 32   Alcoholism Brother    Arthritis Maternal Grandmother    Heart disease Maternal Grandmother    Diabetes Maternal Grandmother    Social History   Socioeconomic History   Marital status: Married    Spouse name: Not on file   Number of children: Not on file   Years of education: Not on file   Highest education level: Not on file  Occupational History   Not on file  Tobacco Use   Smoking status: Never   Smokeless tobacco: Never  Vaping Use   Vaping status: Never Used  Substance and Sexual Activity   Alcohol use: No   Drug use: No   Sexual activity: Not on file  Other Topics Concern   Not on file  Social History Narrative   Not on file   Social Drivers of Health   Financial Resource Strain: Low Risk  (03/10/2023)   Overall Financial Resource Strain (CARDIA)    Difficulty of Paying Living Expenses: Not hard at all  Food Insecurity: No Food Insecurity (03/10/2023)   Hunger Vital Sign    Worried  About Running Out of Food in the Last Year: Never true    Ran Out of Food in the Last Year: Never true  Transportation Needs: No Transportation Needs (03/10/2023)   PRAPARE - Administrator, Civil Service (Medical): No    Lack of Transportation (Non-Medical): No  Physical Activity: Sufficiently Active (03/10/2023)   Exercise Vital Sign    Days of Exercise per Week: 5 days    Minutes of Exercise per Session: 30 min  Stress: Stress Concern Present (03/10/2023)   [employer] of Occupational Health - Occupational Stress Questionnaire    Feeling of Stress : To some extent  Social Connections: Moderately Integrated (03/10/2023)   Social Connection and Isolation Panel [NHANES]    Frequency of Communication with Friends and  Family: More than three times a week    Frequency of Social Gatherings with Friends and Family: Twice a week    Attends Religious Services: Never    Database administrator or Organizations: Yes    Attends Engineer, structural: More than 4 times per year    Marital Status: Married     Review of Systems  Constitutional:  Negative for appetite change and unexpected weight change.  HENT:  Negative for congestion and sinus pressure.   Respiratory:  Negative for cough, chest tightness and shortness of breath.   Cardiovascular:  Negative for chest pain and palpitations.  Gastrointestinal:  Positive for nausea. Negative for abdominal pain and vomiting.  Genitourinary:  Negative for difficulty urinating and dysuria.  Musculoskeletal:  Negative for joint swelling and myalgias.  Skin:  Negative for color change and rash.  Neurological:  Negative for dizziness and headaches.  Psychiatric/Behavioral:  Negative for agitation and dysphoric mood.        Objective:     BP 110/68   Pulse 60   Temp 97.9 F (36.6 C)   Resp 16   Ht 5\' 4"  (1.626 m)   Wt 117 lb (53.1 kg)   SpO2 99%   BMI 20.08 kg/m  Wt Readings from Last 3 Encounters:  07/17/23 117 lb (53.1 kg)  06/29/23 116 lb 8 oz (52.8 kg)  04/01/23 114 lb 9.6 oz (52 kg)    Physical Exam Vitals reviewed.  Constitutional:      General: She is not in acute distress.    Appearance: Normal appearance.  HENT:     Head: Normocephalic and atraumatic.     Right Ear: External ear normal.     Left Ear: External ear normal.     Mouth/Throat:     Pharynx: No oropharyngeal exudate or posterior oropharyngeal erythema.  Eyes:     General: No scleral icterus.       Right eye: No discharge.        Left eye: No discharge.     Conjunctiva/sclera: Conjunctivae normal.  Neck:     Thyroid: No thyromegaly.  Cardiovascular:     Rate and Rhythm: Normal rate and regular rhythm.  Pulmonary:     Effort: No respiratory distress.     Breath  sounds: Normal breath sounds. No wheezing.  Abdominal:     General: Bowel sounds are normal.     Palpations: Abdomen is soft.     Tenderness: There is no abdominal tenderness.  Musculoskeletal:        General: No swelling or tenderness.     Cervical back: Neck supple. No tenderness.  Lymphadenopathy:     Cervical: No cervical adenopathy.  Skin:  Findings: No erythema or rash.  Neurological:     Mental Status: She is alert.  Psychiatric:        Mood and Affect: Mood normal.        Behavior: Behavior normal.      Outpatient Encounter Medications as of 07/17/2023  Medication Sig   alendronate (FOSAMAX) 70 MG tablet TAKE ONE TABLET BY MOUTH EVERY WEEK WITH A FULL GLASS OF WATER ON AN EMPTY STOMACH   amLODipine (NORVASC) 5 MG tablet Take 1 tablet (5 mg total) by mouth daily.   aspirin 81 MG chewable tablet Chew 81 mg by mouth daily.   blood glucose meter kit and supplies KIT Dispense based on patient and insurance preference. Use up to four times daily as directed.   Coenzyme Q10 (CO Q 10 PO) Take 1 tablet by mouth daily at 6 (six) AM.   Cyanocobalamin (VITAMIN B12 PO) Take 2 tablets by mouth daily at 6 (six) AM. gummies   glucose blood (FREESTYLE LITE) test strip Use to check blood sugars three times daily. E11.65   Insulin Pen Needle (PEN NEEDLES) 33G X 4 MM MISC Use to inject Ozempic   levothyroxine (SYNTHROID) 50 MCG tablet Take 1 tablet (50 mcg total) by mouth daily.   MAGNESIUM PO Take 1 tablet by mouth daily at 6 (six) AM.   metFORMIN (GLUCOPHAGE-XR) 500 MG 24 hr tablet Take 2 tablets bid   metoprolol succinate (TOPROL-XL) 25 MG 24 hr tablet Take 1 tablet (25 mg total) by mouth 2 (two) times daily.   Multiple Vitamin (MULTIVITAMIN) tablet Take 1 tablet by mouth daily.   polycarbophil (FIBERCON) 625 MG tablet Take 625 mg by mouth daily.   Probiotic Product (PROBIOTIC DAILY PO) Take 1 tablet by mouth daily at 6 (six) AM.   rosuvastatin (CRESTOR) 20 MG tablet Take 1 tablet (20  mg total) by mouth daily.   [DISCONTINUED] eszopiclone (LUNESTA) 1 MG TABS tablet Take 1 tablet (1 mg total) by mouth at bedtime as needed for sleep. Take immediately before bedtime   [DISCONTINUED] metFORMIN (GLUCOPHAGE-XR) 500 MG 24 hr tablet Take 2 tablets (1,000 mg total) by mouth daily. TAKE 2 TABLETS TWICE A DAY WITH A MEAL (Patient taking differently: Take 1,000 mg by mouth daily.)   [DISCONTINUED] OZEMPIC, 0.25 OR 0.5 MG/DOSE, 2 MG/3ML SOPN Inject 0.5 mg into the skin once a week.   No facility-administered encounter medications on file as of 07/17/2023.     Lab Results  Component Value Date   WBC 5.3 03/27/2023   HGB 12.6 03/27/2023   HCT 37.8 03/27/2023   PLT 266.0 03/27/2023   GLUCOSE 110 (H) 07/15/2023   CHOL 126 07/15/2023   TRIG 63.0 07/15/2023   HDL 78.50 07/15/2023   LDLCALC 35 07/15/2023   ALT 15 07/15/2023   AST 23 07/15/2023   NA 136 07/15/2023   K 4.4 07/15/2023   CL 100 07/15/2023   CREATININE 0.91 07/15/2023   BUN 10 07/15/2023   CO2 31 07/15/2023   TSH 0.90 11/18/2022   HGBA1C 6.7 (H) 07/15/2023   MICROALBUR <0.7 11/18/2022    DG Bone Density Result Date: 05/19/2023 EXAM: DUAL X-RAY ABSORPTIOMETRY (DXA) FOR BONE MINERAL DENSITY IMPRESSION: Your patient Gopi Goodnow completed a BMD test on 05/19/2023 using the [employer] iDXA DXA System (software version: 14.10) manufactured by [employer]. The following summarizes the results of our evaluation. Technologist: MTB PATIENT BIOGRAPHICAL: Name: Aelin, Majmudar Patient ID: [id number] Birth Date: [birth year] Height: 64.0 in. Gender: Female  Exam Date: 05/19/2023 Weight: 114.6 lbs. Indications: Hypothyroid, Caucasian, Postmenopausal, Diabetic Fractures: Treatments: Multi-Vitamin, Levothyroxine, Vitamin D DENSITOMETRY RESULTS: Site          Region      Measured Date Measured Age WHO Classification Young Adult T-score BMD         %Change vs. Previous Significant Change (*) AP Spine L1-L2 05/19/2023 71.8 Normal  -0.3 1.132 g/cm2 -1.7% - AP Spine L1-L2 09/08/2017 66.1 Normal -0.2 1.152 g/cm2 - - DualFemur Total Right 05/19/2023 71.8 Osteopenia -1.3 0.838 g/cm2 -5.1% Yes DualFemur Total Right 09/08/2017 66.1 Normal -1.0 0.883 g/cm2 - - DualFemur Total Mean 05/19/2023 71.8 Osteopenia -1.1 0.872 g/cm2 -4.0% Yes DualFemur Total Mean 09/08/2017 66.1 Normal -0.8 0.908 g/cm2 - - Right Forearm Radius 33% 05/19/2023 71.8 Osteoporosis -3.4 0.575 g/cm2 -16.5% Yes Right Forearm Radius 33% 09/08/2017 66.1 Osteopenia -2.1 0.689 g/cm2 - - ASSESSMENT: The BMD measured at Forearm Radius 33% is 0.575 g/cm2 with a T-score of -3.4. This patient is considered osteoporotic according to World Health Organization [facility]) criteria. The scan quality is good. L-3 and L-4 were excluded due to degenerative changes. Compared with prior study, there has been no significant change in the spine. Compared with prior study, there has been significant decrease in the total hip. World Science writer [facility]) criteria for post-menopausal, Caucasian Women: Normal:                   T-score at or above -1 SD Osteopenia/low bone mass: T-score between -1 and -2.5 SD Osteoporosis:             T-score at or below -2.5 SD RECOMMENDATIONS: 1. All patients should optimize calcium and vitamin D intake. 2. Consider FDA-approved medical therapies in postmenopausal women and men aged 53 years and older, based on the following: a. A hip or vertebral(clinical or morphometric) fracture b. T-score < -2.5 at the femoral neck or spine after appropriate evaluation to exclude secondary causes c. Low bone mass (T-score between -1.0 and -2.5 at the femoral neck or spine) and a 10-year probability of a hip fracture > 3% or a 10-year probability of a major osteoporosis-related fracture > 20% based on the [place]-adapted WHO algorithm 3. Clinician judgment and/or patient preferences may indicate treatment for people with 10-year fracture probabilities above or below these levels FOLLOW-UP:  People with diagnosed cases of osteoporosis or at high risk for fracture should have regular bone mineral density tests. For patients eligible for Medicare, routine testing is allowed once every 2 years. The testing frequency can be increased to one year for patients who have rapidly progressing disease, those who are receiving or discontinuing medical therapy to restore bone mass, or have additional risk factors. I have reviewed this report, and agree with the above findings. [facility] Electronically Signed   By: Frederico Hamman M.D.   On: 05/19/2023 08:38       Assessment & Plan:  Healthcare maintenance Assessment & Plan: Physical today 07/17/23.  Mammogram 03/17/23 - Birads I.  IFOB 05/2023 - negative.     Diabetes mellitus without complication (HCC)  Type 2 diabetes mellitus with hyperglycemia, without long-term current use of insulin (HCC) Assessment & Plan: She wants to stop ozempic. Will stop. Increase metformin to 500mg  2 tablets bid.  Low carb diet and exercise.  Follow met b and a1c. A1c - 6.7. discussed the need to monitor for side effects of medication.  Follow.  Call with update.    Tricuspid valve insufficiency, unspecified etiology  Assessment & Plan:  Had f/u with cardiology 06/29/23 - ECHO moderate TR.  Recommended f/u echo - scheduled for 08/04/23.   Stress Assessment & Plan: Increased stress. Discussed.  Does not feel needs any further intervention at this time. Follow.    Raynaud's disease without gangrene Assessment & Plan: Saw cardiology. Discussed her raynaud's.  They recommended her stop losartan and start amlodipine. Is off losartan. Taking amlodipine.  Follow pressures.     Osteoporosis without current pathological fracture, unspecified osteoporosis type Assessment & Plan: Continue fosamax, calcium and vitamin D.    Hypothyroidism, unspecified type Assessment & Plan: On thyroid replacement.  Follow tsh.    Hypertension,  essential Assessment & Plan: On amlodipine. Off losartan as outlined.  Follow pressures.  Follow metabolic panel.    Hypercholesterolemia Assessment & Plan: Continue crestor.  Follow lipid panel and liver function tests.     Other orders -     metFORMIN HCl ER; Take 2 tablets bid  Dispense: 120 tablet; Refill: 3     Dale Regal, MD

## 2023-07-17 NOTE — Telephone Encounter (Signed)
I am ok to send in rx for lunesta, but need to clarify if she wants the 1mg  or 2mg  tablets. She has been on 2mg  tablets previously, but we have listed 1mg  tablet on medication list now.

## 2023-07-17 NOTE — Assessment & Plan Note (Signed)
Physical today 07/17/23.  Mammogram 03/17/23 - Birads I.  IFOB 05/2023 - negative.

## 2023-07-18 MED ORDER — ESZOPICLONE 2 MG PO TABS: 2.0000 mg | ORAL_TABLET | Freq: Every evening | ORAL | 1 refills | Status: DC | PRN

## 2023-07-18 NOTE — Telephone Encounter (Signed)
Rx ok'd for lunesta 2mg . See my chart message.

## 2023-07-20 ENCOUNTER — Encounter: Payer: Self-pay | Admitting: Internal Medicine

## 2023-07-20 NOTE — Assessment & Plan Note (Signed)
On amlodipine. Off losartan as outlined.  Follow pressures.  Follow metabolic panel.

## 2023-07-20 NOTE — Assessment & Plan Note (Signed)
Continue fosamax, calcium and vitamin D.

## 2023-07-20 NOTE — Assessment & Plan Note (Signed)
Continue crestor.  Follow lipid panel and liver function tests.  

## 2023-07-20 NOTE — Assessment & Plan Note (Signed)
On thyroid replacement.  Follow tsh.  

## 2023-07-20 NOTE — Assessment & Plan Note (Signed)
She wants to stop ozempic. Will stop. Increase metformin to 500mg  2 tablets bid.  Low carb diet and exercise.  Follow met b and a1c. A1c - 6.7. discussed the need to monitor for side effects of medication.  Follow.  Call with update.

## 2023-07-20 NOTE — Assessment & Plan Note (Signed)
Increased stress.  Discussed.  Does not feel needs any further intervention at this time.  Follow.

## 2023-07-20 NOTE — Assessment & Plan Note (Signed)
Had f/u with cardiology 06/29/23 - ECHO moderate TR.  Recommended f/u echo - scheduled for 08/04/23.

## 2023-07-20 NOTE — Assessment & Plan Note (Signed)
Saw cardiology. Discussed her raynaud's.  They recommended her stop losartan and start amlodipine. Is off losartan. Taking amlodipine.  Follow pressures.

## 2023-07-27 ENCOUNTER — Other Ambulatory Visit: Payer: Self-pay | Admitting: Medical

## 2023-07-27 ENCOUNTER — Encounter: Payer: Self-pay | Admitting: Internal Medicine

## 2023-07-27 DIAGNOSIS — I38 Endocarditis, valve unspecified: Secondary | ICD-10-CM

## 2023-07-27 DIAGNOSIS — I10 Essential (primary) hypertension: Secondary | ICD-10-CM

## 2023-07-27 DIAGNOSIS — I7301 Raynaud's syndrome with gangrene: Secondary | ICD-10-CM

## 2023-07-27 DIAGNOSIS — E782 Mixed hyperlipidemia: Secondary | ICD-10-CM

## 2023-07-27 DIAGNOSIS — I493 Ventricular premature depolarization: Secondary | ICD-10-CM

## 2023-07-28 NOTE — Telephone Encounter (Signed)
 Since she is being referred for reclast, do you want her to continue fosamax for now?

## 2023-07-28 NOTE — Telephone Encounter (Signed)
I can refer her to endocrinology (at [facility] They will evaluate her for reclast (IV infusion for osteoporosis).  Given that it is IV, this bypasses the GI tract.  This is a once a year infustion.

## 2023-07-29 NOTE — Telephone Encounter (Signed)
 She can hold fosamax until evaluated by endocrinology, especially if she is concerned regarding GI symptoms.

## 2023-08-04 ENCOUNTER — Ambulatory Visit: Payer: Medicare Other | Attending: Medical

## 2023-08-04 DIAGNOSIS — I38 Endocarditis, valve unspecified: Secondary | ICD-10-CM | POA: Diagnosis not present

## 2023-08-04 LAB — ECHOCARDIOGRAM COMPLETE
AV Mean grad: 3 mm[Hg]
AV Peak grad: 5.9 mm[Hg]
Ao pk vel: 1.21 m/s
Area-P 1/2: 2.91 cm2
S' Lateral: 2.7 cm

## 2023-08-13 DIAGNOSIS — H04123 Dry eye syndrome of bilateral lacrimal glands: Secondary | ICD-10-CM | POA: Diagnosis not present

## 2023-08-13 DIAGNOSIS — H2513 Age-related nuclear cataract, bilateral: Secondary | ICD-10-CM | POA: Diagnosis not present

## 2023-08-13 DIAGNOSIS — H40033 Anatomical narrow angle, bilateral: Secondary | ICD-10-CM | POA: Diagnosis not present

## 2023-08-13 LAB — HM DIABETES EYE EXAM

## 2023-08-17 ENCOUNTER — Encounter: Payer: Self-pay | Admitting: Internal Medicine

## 2023-08-17 DIAGNOSIS — M810 Age-related osteoporosis without current pathological fracture: Secondary | ICD-10-CM

## 2023-08-17 NOTE — Telephone Encounter (Signed)
Can you please follow up on endo referral

## 2023-08-19 NOTE — Telephone Encounter (Signed)
Endo referral placed. 

## 2023-08-24 ENCOUNTER — Other Ambulatory Visit: Payer: Self-pay | Admitting: Internal Medicine

## 2023-08-25 ENCOUNTER — Encounter: Payer: Self-pay | Admitting: Internal Medicine

## 2023-08-25 ENCOUNTER — Encounter: Payer: Self-pay | Admitting: Ophthalmology

## 2023-08-25 DIAGNOSIS — H2512 Age-related nuclear cataract, left eye: Secondary | ICD-10-CM | POA: Diagnosis not present

## 2023-08-31 NOTE — Discharge Instructions (Signed)

## 2023-09-02 ENCOUNTER — Other Ambulatory Visit: Payer: Self-pay

## 2023-09-02 ENCOUNTER — Ambulatory Visit: Payer: Medicare Other | Admitting: Anesthesiology

## 2023-09-02 ENCOUNTER — Ambulatory Visit
Admission: RE | Admit: 2023-09-02 | Discharge: 2023-09-02 | Disposition: A | Discharge status: Home / Self Care | Payer: Medicare Other | Attending: Ophthalmology | Admitting: Ophthalmology

## 2023-09-02 ENCOUNTER — Encounter
Admission: RE | Disposition: A | Discharge status: Home / Self Care | Payer: Self-pay | Source: Home / Self Care | Attending: Ophthalmology

## 2023-09-02 ENCOUNTER — Encounter: Payer: Self-pay | Admitting: Ophthalmology

## 2023-09-02 DIAGNOSIS — H5703 Miosis: Secondary | ICD-10-CM | POA: Insufficient documentation

## 2023-09-02 DIAGNOSIS — I10 Essential (primary) hypertension: Secondary | ICD-10-CM | POA: Insufficient documentation

## 2023-09-02 DIAGNOSIS — K219 Gastro-esophageal reflux disease without esophagitis: Secondary | ICD-10-CM | POA: Insufficient documentation

## 2023-09-02 DIAGNOSIS — M1990 Unspecified osteoarthritis, unspecified site: Secondary | ICD-10-CM | POA: Insufficient documentation

## 2023-09-02 DIAGNOSIS — Z833 Family history of diabetes mellitus: Secondary | ICD-10-CM | POA: Insufficient documentation

## 2023-09-02 DIAGNOSIS — H2512 Age-related nuclear cataract, left eye: Secondary | ICD-10-CM | POA: Insufficient documentation

## 2023-09-02 DIAGNOSIS — I081 Rheumatic disorders of both mitral and tricuspid valves: Secondary | ICD-10-CM | POA: Insufficient documentation

## 2023-09-02 DIAGNOSIS — E1136 Type 2 diabetes mellitus with diabetic cataract: Secondary | ICD-10-CM | POA: Diagnosis not present

## 2023-09-02 DIAGNOSIS — E039 Hypothyroidism, unspecified: Secondary | ICD-10-CM | POA: Insufficient documentation

## 2023-09-02 DIAGNOSIS — R519 Headache, unspecified: Secondary | ICD-10-CM | POA: Insufficient documentation

## 2023-09-02 DIAGNOSIS — I7300 Raynaud's syndrome without gangrene: Secondary | ICD-10-CM | POA: Insufficient documentation

## 2023-09-02 HISTORY — DX: Presence of dental prosthetic device (complete) (partial): Z97.2

## 2023-09-02 HISTORY — PX: CATARACT EXTRACTION W/PHACO: SHX586

## 2023-09-02 HISTORY — DX: Raynaud's syndrome without gangrene: I73.00

## 2023-09-02 HISTORY — DX: Dorsopathy, unspecified: M53.9

## 2023-09-02 LAB — GLUCOSE, CAPILLARY: Glucose-Capillary: 99 mg/dL (ref 70–99)

## 2023-09-02 SURGERY — PHACOEMULSIFICATION, CATARACT, WITH IOL INSERTION
Anesthesia: General | Laterality: Left

## 2023-09-02 MED ORDER — MOXIFLOXACIN HCL 0.5 % OP SOLN
OPHTHALMIC | Status: DC | PRN
  Administered 2023-09-02: 1 [drp] via OPHTHALMIC

## 2023-09-02 MED ORDER — FENTANYL CITRATE (PF) 100 MCG/2ML IJ SOLN
INTRAMUSCULAR | Status: AC
  Filled 2023-09-02: qty 2

## 2023-09-02 MED ORDER — SIGHTPATH DOSE#1 BSS IO SOLN
INTRAOCULAR | Status: DC | PRN
  Administered 2023-09-02: 53 mL via OPHTHALMIC

## 2023-09-02 MED ORDER — SIGHTPATH DOSE#1 BSS IO SOLN
INTRAOCULAR | Status: DC | PRN
  Administered 2023-09-02: 2 mL

## 2023-09-02 MED ORDER — TETRACAINE HCL 0.5 % OP SOLN
1.0000 [drp] | OPHTHALMIC | Status: DC | PRN
  Administered 2023-09-02 (×3): 1 [drp] via OPHTHALMIC

## 2023-09-02 MED ORDER — MIDAZOLAM HCL 2 MG/2ML IJ SOLN
INTRAMUSCULAR | Status: DC | PRN
  Administered 2023-09-02 (×2): 1 mg via INTRAVENOUS

## 2023-09-02 MED ORDER — SIGHTPATH DOSE#1 NA HYALUR & NA CHOND-NA HYALUR IO KIT
PACK | INTRAOCULAR | Status: DC | PRN
  Administered 2023-09-02: 1 via OPHTHALMIC

## 2023-09-02 MED ORDER — MIDAZOLAM HCL 2 MG/2ML IJ SOLN
INTRAMUSCULAR | Status: AC
  Filled 2023-09-02: qty 2

## 2023-09-02 MED ORDER — ARMC OPHTHALMIC DILATING DROPS
1.0000 | OPHTHALMIC | Status: DC | PRN
  Administered 2023-09-02 (×3): 1 via OPHTHALMIC

## 2023-09-02 MED ORDER — FENTANYL CITRATE (PF) 100 MCG/2ML IJ SOLN
INTRAMUSCULAR | Status: DC | PRN
  Administered 2023-09-02: 50 ug via INTRAVENOUS

## 2023-09-02 MED ORDER — SIGHTPATH DOSE#1 BSS IO SOLN
INTRAOCULAR | Status: DC | PRN
  Administered 2023-09-02: 15 mL via INTRAOCULAR

## 2023-09-02 MED ORDER — ARMC OPHTHALMIC DILATING DROPS
OPHTHALMIC | Status: AC
  Filled 2023-09-02: qty 0.5

## 2023-09-02 MED ORDER — BRIMONIDINE TARTRATE-TIMOLOL 0.2-0.5 % OP SOLN
OPHTHALMIC | Status: DC | PRN
  Administered 2023-09-02: 1 [drp] via OPHTHALMIC

## 2023-09-02 MED ORDER — TETRACAINE HCL 0.5 % OP SOLN
OPHTHALMIC | Status: AC
  Filled 2023-09-02: qty 4

## 2023-09-02 SURGICAL SUPPLY — 10 items
CATARACT SUITE SIGHTPATH (MISCELLANEOUS) ×1
FEE CATARACT SUITE SIGHTPATH (MISCELLANEOUS) ×1 IMPLANT
GLOVE SRG 8 PF TXTR STRL LF DI (GLOVE) ×1 IMPLANT
GLOVE SURG ENC TEXT LTX SZ7.5 (GLOVE) ×1 IMPLANT
LENS CLRN VIVITY TORIC  27.0 ×1 IMPLANT
LENS IOL CLRN VT TRC 3 27.0 IMPLANT
NDL FILTER BLUNT 18X1 1/2 (NEEDLE) ×1 IMPLANT
NEEDLE FILTER BLUNT 18X1 1/2 (NEEDLE) ×1
RING MALYGIN 7.0 (MISCELLANEOUS) IMPLANT
SYR 3ML LL SCALE MARK (SYRINGE) ×1 IMPLANT

## 2023-09-02 NOTE — Anesthesia Postprocedure Evaluation (Signed)
 Anesthesia Post Note  Patient: Donna Howell  Procedure(s) Performed: CATARACT EXTRACTION PHACO AND INTRAOCULAR LENS PLACEMENT (IOC) LEFT DIABETIC MALYUGIN  CLAREON VIVITY TORIC 8.20 00:44.0 (Left)  Patient location during evaluation: PACU Anesthesia Type: General Level of consciousness: awake and alert Pain management: pain level controlled Vital Signs Assessment: post-procedure vital signs reviewed and stable Respiratory status: spontaneous breathing, nonlabored ventilation, respiratory function stable and patient connected to nasal cannula oxygen Cardiovascular status: stable and blood pressure returned to baseline Postop Assessment: no apparent nausea or vomiting Anesthetic complications: no   No notable events documented.   Last Vitals:  Vitals:   09/02/23 0800 09/02/23 0804  BP: 103/64 99/67  Pulse: (!) 56 (!) 55  Resp: 13 16  Temp: 36.8 C   SpO2: 98% 98%    Last Pain:  Vitals:   09/02/23 0804  TempSrc:   PainSc: 0-No pain                 Emmi Wertheim C Whitleigh Garramone

## 2023-09-02 NOTE — Op Note (Signed)
 LOCATION:  [facility]   PREOPERATIVE DIAGNOSIS:  Nuclear sclerotic cataract of the left eye with miotic pupil H25.12  POSTOPERATIVE DIAGNOSIS:  Nuclear sclerotic cataract of the left eye with miotic pupil   PROCEDURE:  Phacoemulsification with Toric posterior chamber intraocular lens placement of the left eye with Malyugin ring   Ultrasound time: Procedure(s): CATARACT EXTRACTION PHACO AND INTRAOCULAR LENS PLACEMENT (IOC) LEFT DIABETIC MALYUGIN  CLAREON VIVITY TORIC 8.20 00:44.0 (Left)  LENS:   Implant Name Type Inv. Item Serial No. Manufacturer Lot No. LRB No. Used Action  CLAREON VIVITY TORIC IOL   [id number] ALCON  Left 1 Implanted     [id number] 27.0 vivity Toric intraocular lens with 1.5 diopters of cylindrical power with axis orientation at 58 degrees.     SURGEON:  Dene FABIENE Etienne, MD   ANESTHESIA:  Topical with tetracaine  drops and 2% Xylocaine  jelly, augmented with 1% preservative-free intracameral lidocaine .  COMPLICATIONS:  None.   DESCRIPTION OF PROCEDURE:  The patient was identified in the holding room and transported to the operating suite and placed in the supine position under the operating microscope.  The left eye was identified as the operative eye, and it was prepped and draped in the usual sterile ophthalmic fashion.    A clear-corneal paracentesis incision was made at the 1:30 position.  0.5 ml of preservative-free 1% lidocaine  was injected into the anterior chamber. The anterior chamber was filled with Viscoat.  A 2.4 millimeter near clear corneal incision was then made at the 10:30 position.  Bimanual pupil stretching with lysis of posterior synechia was performed, followed by placement of a 7mm Malyugin ring.  A cystotome and capsulorrhexis forceps were then used to make a curvilinear capsulorrhexis.  Hydrodissection and hydrodelineation were then performed using balanced salt  solution.   Phacoemulsification was then used in stop and chop fashion to  remove the lens, nucleus and epinucleus.  The remaining cortex was aspirated using the irrigation and aspiration handpiece.  Provisc viscoelastic was then placed into the capsular bag to distend it for lens placement.  The Verion digital marker was used to align the implant at the intended axis.   A Toric lens was then injected into the capsular bag.  It was rotated clockwise until the axis marks on the lens were approximately 15 degrees in the counterclockwise direction to the intended alignment. The Malyugin ring was removed. The viscoelastic was aspirated from the eye using the irrigation aspiration handpiece.  Then, a Koch spatula through the sideport incision was used to rotate the lens in a clockwise direction until the axis markings of the intraocular lens were lined up with the Verion alignment.  Balanced salt  solution was then used to hydrate the wounds. Vigamox  0.2 ml of a 1mg  per ml solution was injected into the anterior chamber for a dose of 0.2 mg of intracameral antibiotic at the completion of the case.  The eye was noted to have a physiologic pressure and there was no wound leak noted.   Timolol  and Brimonidine  drops were applied to the eye.  The patient was taken to the recovery room in stable condition having had no complications of anesthesia or surgery.  Donna Howell 09/02/2023, 7:59 AM

## 2023-09-02 NOTE — Anesthesia Preprocedure Evaluation (Addendum)
 Anesthesia Evaluation  Patient identified by MRN, date of birth, ID band Patient awake    Reviewed: Allergy & Precautions, H&P , NPO status , Patient's Chart, lab work & pertinent test results  History of Anesthesia Complications (+) Family history of anesthesia reaction  Airway Mallampati: II  TM Distance: >3 FB Neck ROM: Full    Dental  (+) Lower Dentures, Missing Some missing upper:   Pulmonary neg pulmonary ROS   Pulmonary exam normal breath sounds clear to auscultation       Cardiovascular hypertension, Normal cardiovascular exam+ Valvular Problems/Murmurs  Rhythm:Regular Rate:Normal     Neuro/Psych  Headaches negative neurological ROS  negative psych ROS   GI/Hepatic negative GI ROS, Neg liver ROS,GERD  ,,  Endo/Other  diabetesHypothyroidism    Renal/GU negative Renal ROS  negative genitourinary   Musculoskeletal negative musculoskeletal ROS (+) Arthritis ,    Abdominal   Peds negative pediatric ROS (+)  Hematology negative hematology ROS (+) Blood dyscrasia, anemia   Anesthesia Other Findings Daughter ended up in ICU, could not breathe, had to be intubated after check for endometriosis???   Arthritis Chicken pox Diabetes (HCC) Frequent headaches GERD (gastroesophageal reflux disease) Hay fever Heart murmur High blood pressure History of bronchitis Hypothyroidism Mitral valve regurgitation Anemia Liver cyst IBS (irritable bowel syndrome) Family history of adverse reaction to anesthesia Tricuspid regurgitation Multilevel degenerative disc disease Wears dentures Raynaud's disease      Reproductive/Obstetrics negative OB ROS                             Anesthesia Physical Anesthesia Plan  ASA: 1  Anesthesia Plan: General ETT   Post-op Pain Management:    Induction: Intravenous  PONV Risk Score and Plan:   Airway Management Planned: Oral ETT  Additional  Equipment:   Intra-op Plan:   Post-operative Plan: Extubation in OR  Informed Consent: I have reviewed the patients History and Physical, chart, labs and discussed the procedure including the risks, benefits and alternatives for the proposed anesthesia with the patient or authorized representative who has indicated his/her understanding and acceptance.     Dental Advisory Given  Plan Discussed with: Anesthesiologist, CRNA and Surgeon  Anesthesia Plan Comments: (Patient consented for risks of anesthesia including but not limited to:  - adverse reactions to medications - damage to eyes, teeth, lips or other oral mucosa - nerve damage due to positioning  - sore throat or hoarseness - Damage to heart, brain, nerves, lungs, other parts of body or loss of life  Patient voiced understanding and assent.)       Anesthesia Quick Evaluation

## 2023-09-02 NOTE — Transfer of Care (Signed)
 Immediate Anesthesia Transfer of Care Note  Patient: Donna Howell  Procedure(s) Performed: CATARACT EXTRACTION PHACO AND INTRAOCULAR LENS PLACEMENT (IOC) LEFT DIABETIC MALYUGIN  CLAREON VIVITY TORIC 8.20 00:44.0 (Left)  Patient Location: PACU  Anesthesia Type: General ETT  Level of Consciousness: awake, alert  and patient cooperative  Airway and Oxygen Therapy: Patient Spontanous Breathing and Patient connected to supplemental oxygen  Post-op Assessment: Post-op Vital signs reviewed, Patient's Cardiovascular Status Stable, Respiratory Function Stable, Patent Airway and No signs of Nausea or vomiting  Post-op Vital Signs: Reviewed and stable  Complications: No notable events documented.

## 2023-09-02 NOTE — H&P (Signed)
 [facility]   Primary Care Physician:  Glendia Shad, MD Ophthalmologist: Dr. Dene Etienne  Pre-Procedure History & Physical: HPI:  Donna Howell is a 72 y.o. female here for ophthalmic surgery.   Past Medical History:  Diagnosis Date   Anemia    Arthritis    Chicken pox    Diabetes (HCC)    Family history of adverse reaction to anesthesia    daughter coded during surgery and son had trouble breathing during surgery   Frequent headaches    GERD (gastroesophageal reflux disease)    Hay fever    Heart murmur    High blood pressure    History of bronchitis    Hypothyroidism    IBS (irritable bowel syndrome)    Liver cyst    Mitral valve regurgitation    Multilevel degenerative disc disease    Raynaud's disease    Tricuspid regurgitation    Wears dentures    full lower    Past Surgical History:  Procedure Laterality Date   APPENDECTOMY  1974   BREAST BIOPSY Left 01/16/2021   Affirm bx-Ribbon clip- PREDOMINANTLY BENIGN ADIPOSE TISSUE. - AREAS OF STROMAL FIBROSIS AND MAMMARY GLANDS WITH ATROPHIC AND APOCRINE CHANGES. - NEGATIVE FOR ATYPIA AND MALIGNANCY.   CERVICAL CERCLAGE     DIAGNOSTIC LAPAROSCOPY     SHOULDER ARTHROSCOPY WITH SUBACROMIAL DECOMPRESSION, ROTATOR CUFF REPAIR AND BICEP TENDON REPAIR Right 12/24/2021   Procedure: RIGHT SHOULDER ARTHROSCOPY WITH DEBRIDEMENT, DECOMPRESSION, ROTATOR CUFF RELEASE, AND BICEPS TENODESIS;  Surgeon: Edie Norleen PARAS, MD;  Location: [facility]S;  Service: Orthopedics;  Laterality: Right;   TONSILLECTOMY AND ADENOIDECTOMY  1962    Prior to Admission medications   Medication Sig Start Date End Date Taking? Authorizing Provider  amLODipine  (NORVASC ) 5 MG tablet Take 1 tablet (5 mg total) by mouth daily. 07/10/23 10/08/23 Yes Furth, Cadence H, PA-C  aspirin 81 MG chewable tablet Chew 81 mg by mouth daily.   Yes [provider]  blood glucose meter kit and supplies KIT Dispense based on patient and insurance  preference. Use up to four times daily as directed. 02/25/21  Yes Glendia Shad, MD  Calcium -Vitamin D-Vitamin K (CALCIUM  + D + K PO) Take by mouth daily.   Yes [provider]  Cholecalciferol (VITAMIN D3) 25 MCG (1000 UT) CHEW Chew 2,000 Units by mouth in the morning and at bedtime.   Yes [provider]  Coenzyme Q10 (CO Q 10 PO) Take 1 tablet by mouth daily at 6 (six) AM.   Yes [provider]  Cranberry-Vitamin C-Probiotic (AZO CRANBERRY PO) Take by mouth in the morning and at bedtime.   Yes [provider]  Cyanocobalamin (VITAMIN B12 PO) Take 2 tablets by mouth daily at 6 (six) AM. gummies   Yes [provider]  eszopiclone  (LUNESTA ) 2 MG TABS tablet TAKE ONE TABLET BY MOUTH ONE TIME DAILY AT BEDTIME AS NEEDED FOR SLEEP 08/26/23  Yes Glendia Shad, MD  glucose blood (FREESTYLE LITE) test strip Use to check blood sugars three times daily. E11.65 04/13/23  Yes Glendia Shad, MD  Insulin Pen Needle (PEN NEEDLES) 33G X 4 MM MISC Use to inject Ozempic  09/09/22  Yes Glendia Shad, MD  levothyroxine  (SYNTHROID ) 50 MCG tablet Take 1 tablet (50 mcg total) by mouth daily. 11/27/22  Yes Glendia Shad, MD  MAGNESIUM PO Take 1 tablet by mouth daily at 6 (six) AM.   Yes [provider]  metFORMIN  (GLUCOPHAGE -XR) 500 MG 24 hr tablet Take 2  tablets bid 07/17/23  Yes Glendia Shad, MD  metoprolol  succinate (TOPROL -XL) 25 MG 24 hr tablet Take 1 tablet (25 mg total) by mouth 2 (two) times daily. 11/27/22  Yes Glendia Shad, MD  Multiple Vitamin (MULTIVITAMIN) tablet Take 1 tablet by mouth daily.   Yes [provider]  polycarbophil (FIBERCON) 625 MG tablet Take 625 mg by mouth daily.   Yes [provider]  Probiotic Product (PROBIOTIC DAILY PO) Take 1 tablet by mouth daily at 6 (six) AM.   Yes [provider]  rosuvastatin  (CRESTOR ) 20 MG tablet Take 1 tablet (20 mg total) by mouth daily. 11/27/22  Yes Glendia Shad, MD   alendronate  (FOSAMAX ) 70 MG tablet TAKE ONE TABLET BY MOUTH EVERY WEEK WITH A FULL GLASS OF WATER ON AN EMPTY STOMACH Patient not taking: Reported on 08/25/2023 07/01/23   Glendia Shad, MD    Allergies as of 08/17/2023 - Review Complete 07/20/2023  Allergen Reaction Noted   Contrast media [iodinated contrast media] Other (See Comments) 11/06/2016   Buspirone hcl Palpitations 11/20/2020   Penicillins Rash 11/06/2016   Percocet [oxycodone-acetaminophen ] Rash 11/06/2016   Percodan [oxycodone-aspirin] Rash 11/06/2016    Family History  Problem Relation Age of Onset   Arthritis Mother    Heart disease Mother    Diabetes Mother    Stroke Father    Arthritis Sister    Breast cancer Sister 38   Alcoholism Brother    Arthritis Maternal Grandmother    Heart disease Maternal Grandmother    Diabetes Maternal Grandmother     Social History   Socioeconomic History   Marital status: Married    Spouse name: Not on file   Number of children: Not on file   Years of education: Not on file   Highest education level: Not on file  Occupational History   Not on file  Tobacco Use   Smoking status: Never   Smokeless tobacco: Never  Vaping Use   Vaping status: Never Used  Substance and Sexual Activity   Alcohol use: No   Drug use: No   Sexual activity: Not on file  Other Topics Concern   Not on file  Social History Narrative   Not on file   Social Drivers of Health   Financial Resource Strain: Low Risk  (03/10/2023)   Overall Financial Resource Strain (CARDIA)    Difficulty of Paying Living Expenses: Not hard at all  Food Insecurity: No Food Insecurity (03/10/2023)   Hunger Vital Sign    Worried About Running Out of Food in the Last Year: Never true    Ran Out of Food in the Last Year: Never true  Transportation Needs: No Transportation Needs (03/10/2023)   PRAPARE - Administrator, Civil Service (Medical): No    Lack of Transportation (Non-Medical): No  Physical  Activity: Sufficiently Active (03/10/2023)   Exercise Vital Sign    Days of Exercise per Week: 5 days    Minutes of Exercise per Session: 30 min  Stress: Stress Concern Present (03/10/2023)   [employer] of Occupational Health - Occupational Stress Questionnaire    Feeling of Stress : To some extent  Social Connections: Moderately Integrated (03/10/2023)   Social Connection and Isolation Panel [NHANES]    Frequency of Communication with Friends and Family: More than three times a week    Frequency of Social Gatherings with Friends and Family: Twice a week    Attends Religious Services: Never    Production Manager 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 Lungs:  Clear to auscultation.    Heart:  Regular rate and rhythm.   Impression/Plan: Donna Howell is here for ophthalmic surgery.  Risks, benefits, limitations, and alternatives regarding ophthalmic surgery have been reviewed with the patient.  Questions have been answered.  All parties agreeable.   MITTIE GASKIN, MD  09/02/2023, 7:30 AM

## 2023-09-03 ENCOUNTER — Encounter: Payer: Self-pay | Admitting: Ophthalmology

## 2023-09-07 ENCOUNTER — Encounter: Payer: Self-pay | Admitting: Ophthalmology

## 2023-09-18 ENCOUNTER — Encounter: Payer: Self-pay | Admitting: Ophthalmology

## 2023-09-18 NOTE — Anesthesia Preprocedure Evaluation (Addendum)
 Anesthesia Evaluation  Patient identified by MRN, date of birth, ID band Patient awake    Reviewed: Allergy & Precautions, H&P , NPO status , Patient's Chart, lab work & pertinent test results  History of Anesthesia Complications (+) Family history of anesthesia reaction  Airway Mallampati: II  TM Distance: >3 FB Neck ROM: Full    Dental no notable dental hx. (+) Lower Dentures, Missing   Pulmonary neg pulmonary ROS   Pulmonary exam normal breath sounds clear to auscultation       Cardiovascular hypertension, Normal cardiovascular exam+ Valvular Problems/Murmurs  Rhythm:Regular Rate:Normal  08-04-23  1. Left ventricular ejection fraction, by estimation, is 60 to 65%. The  left ventricle has normal function. The left ventricle has no regional  wall motion abnormalities. Left ventricular diastolic parameters are  consistent with Grade I diastolic  dysfunction (impaired relaxation). The average left ventricular global  longitudinal strain is -17.2 %.   2. Right ventricular systolic function is normal. The right ventricular  size is normal. There is normal pulmonary artery systolic pressure. The  estimated right ventricular systolic pressure is 19.3 mmHg.   3. The mitral valve is normal in structure. Mild to moderate mitral valve  regurgitation. No evidence of mitral stenosis.   4. Tricuspid valve regurgitation is moderate.   5. The aortic valve is tricuspid. Aortic valve regurgitation is not  visualized. No aortic stenosis is present.   6. The inferior vena cava is normal in size with greater than 50%  respiratory variability, suggesting right atrial pressure of 3 mmHg.      Neuro/Psych  Headaches negative neurological ROS  negative psych ROS   GI/Hepatic negative GI ROS, Neg liver ROS,GERD  ,,  Endo/Other  diabetesHypothyroidism    Renal/GU negative Renal ROS  negative genitourinary   Musculoskeletal negative  musculoskeletal ROS (+) Arthritis ,    Abdominal   Peds negative pediatric ROS (+)  Hematology negative hematology ROS (+) Blood dyscrasia, anemia   Anesthesia Other Findings Arthritis  Diabetes (HCC)  Frequent headaches GERD (gastroesophageal reflux disease) Hay fever Heart murmur  High blood pressure History of bronchitis  Hypothyroidism Grade I diastolic dysfunction Moderate mitral regurg Moderate tricuspid regurg Mitral valve regurgitation  Anemia Liver cyst  IBS (irritable bowel syndrome) Family history of adverse reaction to anesthesia  Tricuspid regurgitation Multilevel degenerative disc disease Wears dentures Raynaud's disease      Reproductive/Obstetrics negative OB ROS                             Anesthesia Physical Anesthesia Plan  ASA: 3  Anesthesia Plan: MAC   Post-op Pain Management:    Induction: Intravenous  PONV Risk Score and Plan:   Airway Management Planned: Natural Airway and Nasal Cannula  Additional Equipment:   Intra-op Plan:   Post-operative Plan:   Informed Consent: I have reviewed the patients History and Physical, chart, labs and discussed the procedure including the risks, benefits and alternatives for the proposed anesthesia with the patient or authorized representative who has indicated his/her understanding and acceptance.     Dental Advisory Given  Plan Discussed with: Anesthesiologist, CRNA and Surgeon  Anesthesia Plan Comments: (Patient consented for risks of anesthesia including but not limited to:  - adverse reactions to medications - damage to eyes, teeth, lips or other oral mucosa - nerve damage due to positioning  - sore throat or hoarseness - Damage to heart, brain, nerves, lungs, other parts of body  or loss of life  Patient voiced understanding and assent.)        Anesthesia Quick Evaluation

## 2023-09-23 DIAGNOSIS — M810 Age-related osteoporosis without current pathological fracture: Secondary | ICD-10-CM | POA: Diagnosis not present

## 2023-09-23 DIAGNOSIS — E063 Autoimmune thyroiditis: Secondary | ICD-10-CM | POA: Diagnosis not present

## 2023-09-24 NOTE — Discharge Instructions (Signed)

## 2023-09-28 ENCOUNTER — Other Ambulatory Visit: Payer: Self-pay

## 2023-09-28 ENCOUNTER — Encounter
Admission: RE | Disposition: A | Discharge status: Home / Self Care | Payer: Self-pay | Source: Home / Self Care | Attending: Ophthalmology

## 2023-09-28 ENCOUNTER — Ambulatory Visit: Payer: Self-pay | Admitting: Anesthesiology

## 2023-09-28 ENCOUNTER — Encounter: Payer: Self-pay | Admitting: Ophthalmology

## 2023-09-28 ENCOUNTER — Ambulatory Visit
Admission: RE | Admit: 2023-09-28 | Discharge: 2023-09-28 | Disposition: A | Discharge status: Home / Self Care | Payer: Medicare Other | Attending: Ophthalmology | Admitting: Ophthalmology

## 2023-09-28 DIAGNOSIS — I10 Essential (primary) hypertension: Secondary | ICD-10-CM | POA: Insufficient documentation

## 2023-09-28 DIAGNOSIS — I081 Rheumatic disorders of both mitral and tricuspid valves: Secondary | ICD-10-CM | POA: Diagnosis not present

## 2023-09-28 DIAGNOSIS — H2511 Age-related nuclear cataract, right eye: Secondary | ICD-10-CM | POA: Insufficient documentation

## 2023-09-28 DIAGNOSIS — Z7984 Long term (current) use of oral hypoglycemic drugs: Secondary | ICD-10-CM | POA: Diagnosis not present

## 2023-09-28 DIAGNOSIS — K219 Gastro-esophageal reflux disease without esophagitis: Secondary | ICD-10-CM | POA: Diagnosis not present

## 2023-09-28 DIAGNOSIS — E1136 Type 2 diabetes mellitus with diabetic cataract: Secondary | ICD-10-CM | POA: Diagnosis not present

## 2023-09-28 DIAGNOSIS — Z794 Long term (current) use of insulin: Secondary | ICD-10-CM | POA: Diagnosis not present

## 2023-09-28 HISTORY — DX: Nonrheumatic mitral (valve) insufficiency: I34.0

## 2023-09-28 HISTORY — DX: Other ill-defined heart diseases: I51.89

## 2023-09-28 HISTORY — DX: Rheumatic tricuspid insufficiency: I07.1

## 2023-09-28 HISTORY — PX: CATARACT EXTRACTION W/PHACO: SHX586

## 2023-09-28 LAB — GLUCOSE, CAPILLARY: Glucose-Capillary: 94 mg/dL (ref 70–99)

## 2023-09-28 SURGERY — PHACOEMULSIFICATION, CATARACT, WITH IOL INSERTION
Anesthesia: Monitor Anesthesia Care | Site: Eye | Laterality: Right

## 2023-09-28 MED ORDER — BRIMONIDINE TARTRATE-TIMOLOL 0.2-0.5 % OP SOLN
OPHTHALMIC | Status: DC | PRN
  Administered 2023-09-28: 1 [drp] via OPHTHALMIC

## 2023-09-28 MED ORDER — TETRACAINE HCL 0.5 % OP SOLN
1.0000 [drp] | OPHTHALMIC | Status: DC | PRN
  Administered 2023-09-28 (×3): 1 [drp] via OPHTHALMIC

## 2023-09-28 MED ORDER — SIGHTPATH DOSE#1 BSS IO SOLN
INTRAOCULAR | Status: DC | PRN
  Administered 2023-09-28: 2 mL

## 2023-09-28 MED ORDER — SIGHTPATH DOSE#1 BSS IO SOLN
INTRAOCULAR | Status: DC | PRN
  Administered 2023-09-28: 58 mL via OPHTHALMIC

## 2023-09-28 MED ORDER — SIGHTPATH DOSE#1 NA HYALUR & NA CHOND-NA HYALUR IO KIT
PACK | INTRAOCULAR | Status: DC | PRN
  Administered 2023-09-28: 1 via OPHTHALMIC

## 2023-09-28 MED ORDER — TETRACAINE HCL 0.5 % OP SOLN
OPHTHALMIC | Status: AC
  Filled 2023-09-28: qty 4

## 2023-09-28 MED ORDER — MIDAZOLAM HCL 2 MG/2ML IJ SOLN
INTRAMUSCULAR | Status: DC | PRN
  Administered 2023-09-28 (×2): 1 mg via INTRAVENOUS

## 2023-09-28 MED ORDER — ARMC OPHTHALMIC DILATING DROPS
1.0000 | OPHTHALMIC | Status: DC | PRN
  Administered 2023-09-28 (×3): 1 via OPHTHALMIC

## 2023-09-28 MED ORDER — SIGHTPATH DOSE#1 BSS IO SOLN
INTRAOCULAR | Status: DC | PRN
  Administered 2023-09-28: 15 mL via INTRAOCULAR

## 2023-09-28 MED ORDER — MIDAZOLAM HCL 2 MG/2ML IJ SOLN
INTRAMUSCULAR | Status: AC
  Filled 2023-09-28: qty 2

## 2023-09-28 MED ORDER — ARMC OPHTHALMIC DILATING DROPS
OPHTHALMIC | Status: AC
  Filled 2023-09-28: qty 0.5

## 2023-09-28 MED ORDER — MOXIFLOXACIN HCL 0.5 % OP SOLN
OPHTHALMIC | Status: DC | PRN
  Administered 2023-09-28: .2 mL via OPHTHALMIC

## 2023-09-28 SURGICAL SUPPLY — 12 items
CATARACT SUITE SIGHTPATH (MISCELLANEOUS) ×1 IMPLANT
FEE CATARACT SUITE SIGHTPATH (MISCELLANEOUS) ×1 IMPLANT
GLOVE BIOGEL PI IND STRL 8 (GLOVE) ×1 IMPLANT
GLOVE SURG LX STRL 7.5 STRW (GLOVE) ×1 IMPLANT
GLOVE SURG PROTEXIS BL SZ6.5 (GLOVE) ×1 IMPLANT
GLOVE SURG SYN 6.5 PF PI BL (GLOVE) ×1 IMPLANT
LENS CLAREON VIVITY 27.0 ×1 IMPLANT
LENS IOL CLRN VT YLW 27.0 IMPLANT
NDL FILTER BLUNT 18X1 1/2 (NEEDLE) ×1 IMPLANT
NEEDLE FILTER BLUNT 18X1 1/2 (NEEDLE) ×1 IMPLANT
RING MALYGIN 7.0 (MISCELLANEOUS) IMPLANT
SYR 3ML LL SCALE MARK (SYRINGE) ×1 IMPLANT

## 2023-09-28 NOTE — Op Note (Signed)
 OPERATIVE NOTE  Donna Howell [id number] 09/28/2023   PREOPERATIVE DIAGNOSIS:    Nuclear Sclerotic Cataract Right eye with miotic pupil.        H25.11  POSTOPERATIVE DIAGNOSIS: Nuclear Sclerotic Cataract Right eye with miotic pupil.          PROCEDURE:  Phacoemusification with posterior chamber intraocular lens placement of the right eye which required pupil stretching with the Malyugin pupil expansion device. Ultrasound time: Procedure(s): CATARACT EXTRACTION PHACO AND INTRAOCULAR LENS PLACEMENT (IOC) RIGHT DIABETIC MALYUGIN  CLAREON VIVITY 6.17 00:41.9 (Right)  LENS:   Implant Name Type Inv. Item Serial No. Manufacturer Lot No. LRB No. Used Action  CLAREON VIVTY IOL Intraocular Lens  [id number]   Right 1 Implanted   [id number] 27.0 D   SURGEON:  Deirdre Evener, MD   ANESTHESIA:  Topical with tetracaine drops and 2% Xylocaine jelly, augmented with 1% preservative-free intracameral lidocaine.   COMPLICATIONS:  None.   DESCRIPTION OF PROCEDURE:  The patient was identified in the holding room and transported to the operating room and placed in the supine position under the operating microscope. The right eye was identified as the operative eye and it was prepped and draped in the usual sterile ophthalmic fashion.   A 1 millimeter clear-corneal paracentesis was made at the 12:00 position.  0.5 ml of preservative-free 1% lidocaine was injected into the anterior chamber. The anterior chamber was filled with Viscoat viscoelastic.  A 2.4 millimeter keratome was used to make a near-clear corneal incision at the 9:00 position. A Malyugin pupil expander was then placed through the main incision and into the anterior chamber of the eye.  The edge of the iris was secured on the lip of the pupil expander and it was released, thereby expanding the pupil to approximately 7 millimeters for completion of the cataract surgery.  Additional Viscoat was placed in the anterior chamber.  A cystotome and  capsulorrhexis forceps were used to make a curvilinear capsulorrhexis.   Balanced salt solution was used to hydrodissect and hydrodelineate the lens nucleus.   Phacoemulsification was used in stop and chop fashion to remove the lens, nucleus and epinucleus.  The remaining cortex was aspirated using the irrigation aspiration handpiece.  Additional Provisc was placed into the eye to distend the capsular bag for lens placement.  A lens was then injected into the capsular bag.  The pupil expanding ring was removed using a Kuglen hook and insertion device. The remaining viscoelastic was aspirated from the capsular bag and the anterior chamber.  The anterior chamber was filled with balanced salt solution to inflate to a physiologic pressure.  Wounds were hydrated with balanced salt solution.  The anterior chamber was inflated to a physiologic pressure with balanced salt solution.  No wound leaks were noted.Vigamox 0.2 ml of a 1mg  per ml solution was injected into the anterior chamber for a dose of 0.2 mg of intracameral antibiotic at the completion of the case. Timolol and Brimonidine drops were applied to the eye.  The patient was taken to the recovery room in stable condition without complications of anesthesia or surgery.  Kristie Bracewell 09/28/2023, 7:50 AM

## 2023-09-28 NOTE — H&P (Signed)
 [facility]   Primary Care Physician:  Donna [place], MD Ophthalmologist: Dr. Lockie Mola  Pre-Procedure History & Physical: HPI:  Donna Howell is a 73 y.o. female here for ophthalmic surgery.   Past Medical History:  Diagnosis Date   Anemia    Arthritis    Chicken pox    Diabetes (HCC)    Family history of adverse reaction to anesthesia    daughter coded during surgery and son had trouble breathing during surgery   Frequent headaches    GERD (gastroesophageal reflux disease)    Grade I diastolic dysfunction    Hay fever    Heart murmur    High blood pressure    History of bronchitis    Hypothyroidism    IBS (irritable bowel syndrome)    Liver cyst    Mitral valve regurgitation    Moderate mitral regurgitation by prior echocardiogram    Moderate tricuspid regurgitation by prior echocardiogram    Multilevel degenerative disc disease    Raynaud's disease    Tricuspid regurgitation    Wears dentures    full lower    Past Surgical History:  Procedure Laterality Date   APPENDECTOMY  1974   BREAST BIOPSY Left 01/16/2021   Affirm bx-"Ribbon" clip- PREDOMINANTLY BENIGN ADIPOSE TISSUE. - AREAS OF STROMAL FIBROSIS AND MAMMARY GLANDS WITH ATROPHIC AND APOCRINE CHANGES. - NEGATIVE FOR ATYPIA AND MALIGNANCY.   CATARACT EXTRACTION W/PHACO Left 09/02/2023   Procedure: CATARACT EXTRACTION PHACO AND INTRAOCULAR LENS PLACEMENT (IOC) LEFT DIABETIC MALYUGIN  CLAREON VIVITY TORIC 8.20 00:44.0;  Surgeon: Lockie Mola, MD;  Location: [facility];  Service: Ophthalmology;  Laterality: Left;   CERVICAL CERCLAGE     DIAGNOSTIC LAPAROSCOPY     SHOULDER ARTHROSCOPY WITH SUBACROMIAL DECOMPRESSION, ROTATOR CUFF REPAIR AND BICEP TENDON REPAIR Right 12/24/2021   Procedure: RIGHT SHOULDER ARTHROSCOPY WITH DEBRIDEMENT, DECOMPRESSION, ROTATOR CUFF RELEASE, AND BICEPS TENODESIS;  Surgeon: Christena Flake, MD;  Location: [facility];  Service: Orthopedics;  Laterality: Right;    TONSILLECTOMY AND ADENOIDECTOMY  1962    Prior to Admission medications   Medication Sig Start Date End Date Taking? Authorizing Provider  amLODipine (NORVASC) 5 MG tablet Take 1 tablet (5 mg total) by mouth daily. 07/10/23 10/08/23 Yes Furth, Cadence H, PA-C  aspirin 81 MG chewable tablet Chew 81 mg by mouth daily.   Yes [provider]  Calcium-Vitamin D-Vitamin K (CALCIUM + D + K PO) Take by mouth daily.   Yes [provider]  Cholecalciferol (VITAMIN D3) 25 MCG (1000 UT) CHEW Chew 2,000 Units by mouth in the morning and at bedtime.   Yes [provider]  Coenzyme Q10 (CO Q 10 PO) Take 1 tablet by mouth daily at 6 (six) AM.   Yes [provider]  Cranberry-Vitamin C-Probiotic (AZO CRANBERRY PO) Take by mouth in the morning and at bedtime.   Yes [provider]  Cyanocobalamin (VITAMIN B12 PO) Take 2 tablets by mouth daily at 6 (six) AM. gummies   Yes [provider]  levothyroxine (SYNTHROID) 50 MCG tablet Take 1 tablet (50 mcg total) by mouth daily. 11/27/22  Yes Donna [place], MD  MAGNESIUM PO Take 1 tablet by mouth daily at 6 (six) AM.   Yes [provider]  metFORMIN (GLUCOPHAGE-XR) 500 MG 24 hr tablet Take 2 tablets bid 07/17/23  Yes Donna [place], MD  metoprolol succinate (TOPROL-XL) 25 MG 24 hr tablet Take 1 tablet (25 mg total) by mouth 2 (two) times daily. 11/27/22  Yes Donna [place], MD  Multiple Vitamin (MULTIVITAMIN) tablet Take 1 tablet by mouth daily.   Yes [provider]  polycarbophil (FIBERCON) 625 MG tablet Take 625 mg by mouth daily.   Yes [provider]  Probiotic Product (PROBIOTIC DAILY PO) Take 1 tablet by mouth daily at 6 (six) AM.   Yes [provider]  rosuvastatin (CRESTOR) 20 MG tablet Take 1 tablet (20 mg total) by mouth daily. 11/27/22  Yes Donna [place], MD  alendronate (FOSAMAX) 70 MG tablet TAKE ONE TABLET BY MOUTH EVERY WEEK WITH A FULL GLASS OF WATER ON AN EMPTY  STOMACH Patient not taking: Reported on 08/25/2023 07/01/23   Donna [place], MD  blood glucose meter kit and supplies KIT Dispense based on patient and insurance preference. Use up to four times daily as directed. 02/25/21   Donna [place], MD  eszopiclone (LUNESTA) 2 MG TABS tablet TAKE ONE TABLET BY MOUTH ONE TIME DAILY AT BEDTIME AS NEEDED FOR SLEEP 08/26/23   Donna [place], MD  glucose blood (FREESTYLE LITE) test strip Use to check blood sugars three times daily. E11.65 04/13/23   Donna [place], MD  Insulin Pen Needle (PEN NEEDLES) 33G X 4 MM MISC Use to inject Ozempic 09/09/22   Donna , MD    Allergies as of 08/17/2023 - Review Complete 07/20/2023  Allergen Reaction Noted   Contrast media [iodinated contrast media] Other (See Comments) 11/06/2016   Buspirone hcl Palpitations 11/20/2020   Penicillins Rash 11/06/2016   Percocet [oxycodone-acetaminophen] Rash 11/06/2016   Percodan [oxycodone-aspirin] Rash 11/06/2016    Family History  Problem Relation Age of Onset   Arthritis Mother    Heart disease Mother    Diabetes Mother    Stroke Father    Arthritis Sister    Breast cancer Sister 31   Alcoholism Brother    Arthritis Maternal Grandmother    Heart disease Maternal Grandmother    Diabetes Maternal Grandmother     Social History   Socioeconomic History   Marital status: Married    Spouse name: Not on file   Number of children: Not on file   Years of education: Not on file   Highest education level: Not on file  Occupational History   Not on file  Tobacco Use   Smoking status: Never   Smokeless tobacco: Never  Vaping Use   Vaping status: Never Used  Substance and Sexual Activity   Alcohol use: No   Drug use: No   Sexual activity: Not on file  Other Topics Concern   Not on file  Social History Narrative   Not on file   Social Drivers of Health   Financial Resource Strain: Low Risk  (09/20/2023)   Received from [facility]   Overall  Financial Resource Strain (CARDIA)    Difficulty of Paying Living Expenses: Not very hard  Food Insecurity: No Food Insecurity (09/20/2023)   Received from [facility]   Hunger Vital Sign    Worried About Running Out of Food in the Last Year: Never true    Ran Out of Food in the Last Year: Never true  Transportation Needs: No Transportation Needs (09/20/2023)   Received from [facility] - Transportation    In the past 12 months, has lack of transportation kept you from medical appointments or from getting medications?: No    Lack of Transportation (Non-Medical): No  Physical Activity: Sufficiently Active (03/10/2023)   Exercise Vital Sign  Days of Exercise per Week: 5 days    Minutes of Exercise per Session: 30 min  Stress: Stress Concern Present (03/10/2023)   [employer] of Occupational Health - Occupational Stress Questionnaire    Feeling of Stress : To some extent  Social Connections: Moderately Integrated (03/10/2023)   Social Connection and Isolation Panel [NHANES]    Frequency of Communication with Friends and Family: More than three times a week    Frequency of Social Gatherings with Friends and Family: Twice a week    Attends Religious Services: Never    Database administrator or Organizations: Yes    Attends Engineer, structural: More than 4 times per year    Marital Status: Married  Catering manager Violence: Not At Risk (03/10/2023)   Humiliation, Afraid, Rape, and Kick questionnaire    Fear of Current or Ex-Partner: No    Emotionally Abused: No    Physically Abused: No    Sexually Abused: No    Review of Systems: See HPI, otherwise negative ROS  Physical Exam: BP 126/72   Pulse (!) 56   Temp (!) 97.3 F (36.3 C) (Temporal)   Resp 11   Ht 5' 4.02" (1.626 m)   Wt 52.6 kg   SpO2 100%   BMI 19.89 kg/m  General:   Alert,  pleasant and cooperative in NAD Head:  Normocephalic and atraumatic. Lungs:   Clear to auscultation.    Heart:  Regular rate and rhythm.   Impression/Plan: Donna Howell is here for ophthalmic surgery.  Risks, benefits, limitations, and alternatives regarding ophthalmic surgery have been reviewed with the patient.  Questions have been answered.  All parties agreeable.   Lockie Mola, MD  09/28/2023, 7:26 AM

## 2023-09-28 NOTE — Anesthesia Postprocedure Evaluation (Signed)
 Anesthesia Post Note  Patient: Donna Howell  Procedure(s) Performed: CATARACT EXTRACTION PHACO AND INTRAOCULAR LENS PLACEMENT (IOC) RIGHT DIABETIC MALYUGIN  CLAREON VIVITY 6.17 00:41.9 (Right: Eye)  Patient location during evaluation: PACU Anesthesia Type: MAC Level of consciousness: awake and alert Pain management: pain level controlled Vital Signs Assessment: post-procedure vital signs reviewed and stable Respiratory status: spontaneous breathing, nonlabored ventilation, respiratory function stable and patient connected to nasal cannula oxygen Cardiovascular status: stable and blood pressure returned to baseline Postop Assessment: no apparent nausea or vomiting Anesthetic complications: no   No notable events documented.   Last Vitals:  Vitals:   09/28/23 0752 09/28/23 0755  BP: 114/74   Pulse: (!) 54   Resp: 14   Temp: 36.6 C 36.6 C  SpO2: 100%     Last Pain:  Vitals:   09/28/23 0752  TempSrc:   PainSc: 0-No pain                 Alyx Gee C Cathye Kreiter

## 2023-09-28 NOTE — Transfer of Care (Signed)
 Immediate Anesthesia Transfer of Care Note  Patient: Donna Howell  Procedure(s) Performed: CATARACT EXTRACTION PHACO AND INTRAOCULAR LENS PLACEMENT (IOC) RIGHT DIABETIC MALYUGIN  CLAREON VIVITY 6.17 00:41.9 (Right: Eye)  Patient Location: PACU  Anesthesia Type: MAC  Level of Consciousness: awake, alert  and patient cooperative  Airway and Oxygen Therapy: Patient Spontanous Breathing and Patient connected to supplemental oxygen  Post-op Assessment: Post-op Vital signs reviewed, Patient's Cardiovascular Status Stable, Respiratory Function Stable, Patent Airway and No signs of Nausea or vomiting  Post-op Vital Signs: Reviewed and stable  Complications: No notable events documented.

## 2023-09-29 ENCOUNTER — Ambulatory Visit: Payer: Medicare Other | Admitting: Medical

## 2023-09-30 ENCOUNTER — Encounter: Payer: Self-pay | Admitting: Internal Medicine

## 2023-09-30 ENCOUNTER — Encounter: Payer: Self-pay | Admitting: Ophthalmology

## 2023-09-30 NOTE — Telephone Encounter (Signed)
 I would recommend for her to notify her dentist of the medication she is taking. They know what specific procedure they would need to do. If they recommend stopping for the procedure, we will need to stop.

## 2023-10-09 NOTE — Telephone Encounter (Signed)
 We can hold for now (since just had procedure).  I can d/w her more at her next appt.  Let me know if any problems.

## 2023-10-21 ENCOUNTER — Other Ambulatory Visit: Payer: Self-pay

## 2023-10-21 DIAGNOSIS — E1165 Type 2 diabetes mellitus with hyperglycemia: Secondary | ICD-10-CM

## 2023-10-21 DIAGNOSIS — E7800 Pure hypercholesterolemia, unspecified: Secondary | ICD-10-CM

## 2023-10-21 DIAGNOSIS — E039 Hypothyroidism, unspecified: Secondary | ICD-10-CM

## 2023-10-23 ENCOUNTER — Ambulatory Visit: Payer: Medicare Other | Attending: Medical | Admitting: Medical

## 2023-10-23 ENCOUNTER — Encounter: Payer: Self-pay | Admitting: Medical

## 2023-10-23 VITALS — BP 104/64 | HR 56 | Ht 64.0 in | Wt 117.2 lb

## 2023-10-23 DIAGNOSIS — Z79899 Other long term (current) drug therapy: Secondary | ICD-10-CM | POA: Diagnosis not present

## 2023-10-23 DIAGNOSIS — I7301 Raynaud's syndrome with gangrene: Secondary | ICD-10-CM | POA: Diagnosis not present

## 2023-10-23 DIAGNOSIS — I38 Endocarditis, valve unspecified: Secondary | ICD-10-CM | POA: Insufficient documentation

## 2023-10-23 DIAGNOSIS — I10 Essential (primary) hypertension: Secondary | ICD-10-CM | POA: Diagnosis not present

## 2023-10-23 DIAGNOSIS — I493 Ventricular premature depolarization: Secondary | ICD-10-CM | POA: Insufficient documentation

## 2023-10-23 DIAGNOSIS — E782 Mixed hyperlipidemia: Secondary | ICD-10-CM | POA: Diagnosis not present

## 2023-10-23 DIAGNOSIS — I5189 Other ill-defined heart diseases: Secondary | ICD-10-CM | POA: Insufficient documentation

## 2023-10-23 MED ORDER — LOSARTAN POTASSIUM 25 MG PO TABS: 25.0000 mg | ORAL_TABLET | Freq: Every day | ORAL | 3 refills | Status: DC

## 2023-10-23 NOTE — Patient Instructions (Signed)
 Medication Instructions:  Your physician recommends the following medication changes.  STOP TAKING: Amlodipine   START TAKING: Losartan 25 mg by mouth daily  *If you need a refill on your cardiac medications before your next appointment, please call your pharmacy*  Lab Work: Your provider would like for you to return in 2 weeks to have the following labs drawn: (BMP).   Please go to [facility] [address] ([facility] [address], [place] [address] You do not need an appointment.  They are open from 8 am- 4:30 pm.  Lunch from 1:00 pm- 2:00 pm You will not need to be fasting.   Testing/Procedures: No test ordered today   Follow-Up: At [facility], you and your health needs are our priority.  As part of our continuing mission to provide you with exceptional heart care, our providers are all part of one team.  This team includes your primary Cardiologist (physician) and Advanced Practice Providers or APPs (Physician Assistants and Nurse Practitioners) who all work together to provide you with the care you need, when you need it.  Your next appointment:   6 month(s)  Provider:   Cadence Fransico Michael, PA-C  We recommend signing up for the patient portal called "MyChart".  Sign up information is provided on this After Visit Summary.  MyChart is used to connect with patients for Virtual Visits (Telemedicine).  Patients are able to view lab/test results, encounter notes, upcoming appointments, etc.  Non-urgent messages can be sent to your provider as well.   To learn more about what you can do with MyChart, go to [email or web address].

## 2023-10-23 NOTE — Progress Notes (Signed)
 Cardiology Office Note:  .   Date:  10/23/2023  ID:  [name], DOB 01/14/1951, MRN [id number] PCP: Dale Horn Hill, MD  [facility] Providers Cardiologist:  Julien Nordmann, MD     History of Present Illness: .   Donna Howell is a 72 y.o. female with a hx of DM2, moderate TR, HLD, HTN, mild LVH, diastolic dysfunction who is being seen for 3 month follow-up for valvular heart disease and Raynaud's.    Echo in May 2022 showed normal LVSF, normal RVSF, mild MR/TR   The patient was seen as a new patient 08/2021 by Dr. Mariah Milling. She was a former Dr. Lady Gary patient. She was overall stable from a cardiac perspective.   The patient was last seen in December 2024 and was overall doing well with minimal shortness of breath.  Echocardiogram was ordered to evaluate murmur.  She was started on amlodipine for Raynaud's disease, and losartan was stopped.  Echo showed EF 60 to 65%, grade 1 diastolic dysfunction, mild to moderate MR, moderate TR.  Today, the patient is overall doing Ok. She feels metformin is causing a lot of GI issues and wants to change this. She will discuss this with her PCP. She reports rare chest discomfort she feels is from heart burn, this occurs after she eats. No overt chest pain or shortness of breath. No exertional symptoms. She stays active as able. She has lower leg edema on amlodipine. PCP ordered labs for this year.  Studies Reviewed: .       Echo 07/2023 1. Left ventricular ejection fraction, by estimation, is 60 to 65%. The  left ventricle has normal function. The left ventricle has no regional  wall motion abnormalities. Left ventricular diastolic parameters are  consistent with Grade I diastolic  dysfunction (impaired relaxation). The average left ventricular global  longitudinal strain is -17.2 %.   2. Right ventricular systolic function is normal. The right ventricular  size is normal. There is normal pulmonary artery systolic pressure. The  estimated  right ventricular systolic pressure is 19.3 mmHg.   3. The mitral valve is normal in structure. Mild to moderate mitral valve  regurgitation. No evidence of mitral stenosis.   4. Tricuspid valve regurgitation is moderate.   5. The aortic valve is tricuspid. Aortic valve regurgitation is not  visualized. No aortic stenosis is present.   6. The inferior vena cava is normal in size with greater than 50%  respiratory variability, suggesting right atrial pressure of 3 mmHg.   Echo 03/2018 Study Conclusions   - Left ventricle: The cavity size was normal. Systolic function was    normal. The estimated ejection fraction was in the range of 60%    to 65%. Wall motion was normal; there were no regional wall    motion abnormalities. Features are consistent with a pseudonormal    left ventricular filling pattern, with concomitant abnormal    relaxation and increased filling pressure (grade 2 diastolic    dysfunction).  - Mitral valve: There was mild regurgitation.  - Left atrium: The atrium was mildly dilated.  - Right ventricle: Systolic function was normal.  - Tricuspid valve: There was moderate-severe regurgitation.  - Pulmonary arteries: Systolic pressure was minimally elevated PA    peak pressure: 32 mm Hg (S).   Physical Exam:   VS:  BP 104/64 (BP Location: Left Arm, Patient Position: Sitting, Cuff Size: Normal)   Pulse (!) 56   Ht 5\' 4"  (1.626 m)   Wt 117 lb  4 oz (53.2 kg)   SpO2 98%   BMI 20.13 kg/m    Wt Readings from Last 3 Encounters:  10/23/23 117 lb 4 oz (53.2 kg)  09/28/23 115 lb 15.4 oz (52.6 kg)  09/02/23 116 lb 8 oz (52.8 kg)    GEN: Well nourished, well developed in no acute distress NECK: No JVD; No carotid bruits CARDIAC: RRR, + murmur, no rubs, gallops RESPIRATORY:  Clear to auscultation without rales, wheezing or rhonchi  ABDOMEN: Soft, non-tender, non-distended EXTREMITIES:  No edema; No deformity   ASSESSMENT AND PLAN: .    Diastolic dysfunction Valvular  disease Recent echo showed normal EF, grade 1 diastolic dysfunction, normal RV SF, mild to moderate MR, moderate TR.  Patient reports dependent edema on amlodipine.  No chest pain or shortness of breath reported.  We can update echo yearly to evaluate valve disease.  Hyperlipidemia LDL 43.  Continue Crestor 20 mg daily.  PCP has ordered repeat LDL.  PVCs Seen on prior EKGs.  She is asymptomatic.  Continue Toprol 25 mg twice a day.  Raynaud's disease Patient was switched from losartan to amlodipine over the winter, however she feels there was not a great benefit with this.  She also reports mild lower leg edema at night.  She is requesting to switch back to losartan.  We will stop amlodipine and restart losartan 25 mg daily.  She may want low-dose amlodipine 2.5 mg over the next winter.  Hypertension Blood pressure on the soft side.  Stop amlodipine and start losartan 25 mg daily as above.  Continue Toprol 25 mg twice daily.    Dispo: Follow-up in 6 months  Signed, Jaquavian Firkus David Stall, PA-C

## 2023-10-26 ENCOUNTER — Other Ambulatory Visit (INDEPENDENT_AMBULATORY_CARE_PROVIDER_SITE_OTHER): Payer: Medicare Other

## 2023-10-26 DIAGNOSIS — E7800 Pure hypercholesterolemia, unspecified: Secondary | ICD-10-CM | POA: Diagnosis not present

## 2023-10-26 DIAGNOSIS — E039 Hypothyroidism, unspecified: Secondary | ICD-10-CM | POA: Diagnosis not present

## 2023-10-26 DIAGNOSIS — E1165 Type 2 diabetes mellitus with hyperglycemia: Secondary | ICD-10-CM

## 2023-10-26 LAB — BASIC METABOLIC PANEL WITH GFR
BUN: 11 mg/dL (ref 6–23)
CO2: 29 meq/L (ref 19–32)
Calcium: 9.4 mg/dL (ref 8.4–10.5)
Chloride: 99 meq/L (ref 96–112)
Creatinine, Ser: 0.82 mg/dL (ref 0.40–1.20)
GFR: 71.52 mL/min (ref >60.00)
Glucose, Bld: 104 mg/dL — ABNORMAL HIGH (ref 70–99)
Potassium: 3.9 meq/L (ref 3.5–5.1)
Sodium: 135 meq/L (ref 135–145)

## 2023-10-26 LAB — MICROALBUMIN / CREATININE URINE RATIO
Creatinine,U: 34.5 mg/dL
Microalb Creat Ratio: UNDETERMINED mg/g (ref 0.0–30.0)
Microalb, Ur: <0.7 mg/dL

## 2023-10-26 LAB — HEPATIC FUNCTION PANEL
ALT: 16 U/L (ref 0–35)
AST: 23 U/L (ref 0–37)
Albumin: 4.5 g/dL (ref 3.5–5.2)
Alkaline Phosphatase: 47 U/L (ref 39–117)
Bilirubin, Direct: 0.1 mg/dL (ref 0.0–0.3)
Total Bilirubin: 0.5 mg/dL (ref 0.2–1.2)
Total Protein: 6.8 g/dL (ref 6.0–8.3)

## 2023-10-26 LAB — LIPID PANEL
Cholesterol: 139 mg/dL (ref 0–200)
HDL: 89.5 mg/dL (ref >39.00)
LDL Cholesterol: 37 mg/dL (ref 0–99)
NonHDL: 49.46
Total CHOL/HDL Ratio: 2
Triglycerides: 60 mg/dL (ref 0.0–149.0)
VLDL: 12 mg/dL (ref 0.0–40.0)

## 2023-10-26 LAB — HEMOGLOBIN A1C: Hgb A1c MFr Bld: 6.6 % — ABNORMAL HIGH (ref 4.6–6.5)

## 2023-10-26 LAB — TSH: TSH: 0.47 u[IU]/mL (ref 0.35–5.50)

## 2023-10-28 ENCOUNTER — Ambulatory Visit (INDEPENDENT_AMBULATORY_CARE_PROVIDER_SITE_OTHER): Payer: Medicare Other | Admitting: Internal Medicine

## 2023-10-28 VITALS — BP 116/68 | HR 60 | Temp 98.0°F | Resp 16 | Ht 64.0 in | Wt 117.8 lb

## 2023-10-28 DIAGNOSIS — F439 Reaction to severe stress, unspecified: Secondary | ICD-10-CM | POA: Diagnosis not present

## 2023-10-28 DIAGNOSIS — E039 Hypothyroidism, unspecified: Secondary | ICD-10-CM | POA: Diagnosis not present

## 2023-10-28 DIAGNOSIS — M810 Age-related osteoporosis without current pathological fracture: Secondary | ICD-10-CM

## 2023-10-28 DIAGNOSIS — I7300 Raynaud's syndrome without gangrene: Secondary | ICD-10-CM | POA: Diagnosis not present

## 2023-10-28 DIAGNOSIS — I10 Essential (primary) hypertension: Secondary | ICD-10-CM | POA: Diagnosis not present

## 2023-10-28 DIAGNOSIS — E1165 Type 2 diabetes mellitus with hyperglycemia: Secondary | ICD-10-CM | POA: Diagnosis not present

## 2023-10-28 DIAGNOSIS — Z7984 Long term (current) use of oral hypoglycemic drugs: Secondary | ICD-10-CM

## 2023-10-28 DIAGNOSIS — Z0000 Encounter for general adult medical examination without abnormal findings: Secondary | ICD-10-CM | POA: Diagnosis not present

## 2023-10-28 DIAGNOSIS — Z7985 Long-term (current) use of injectable non-insulin antidiabetic drugs: Secondary | ICD-10-CM | POA: Diagnosis not present

## 2023-10-28 DIAGNOSIS — I071 Rheumatic tricuspid insufficiency: Secondary | ICD-10-CM | POA: Diagnosis not present

## 2023-10-28 DIAGNOSIS — E7800 Pure hypercholesterolemia, unspecified: Secondary | ICD-10-CM | POA: Diagnosis not present

## 2023-10-28 MED ORDER — METOPROLOL SUCCINATE ER 25 MG PO TB24: 25.0000 mg | ORAL_TABLET | Freq: Two times a day (BID) | ORAL | 3 refills | Status: DC

## 2023-10-28 MED ORDER — LEVOTHYROXINE SODIUM 50 MCG PO TABS: 50.0000 ug | ORAL_TABLET | Freq: Every day | ORAL | 3 refills | Status: DC

## 2023-10-28 MED ORDER — ROSUVASTATIN CALCIUM 20 MG PO TABS: 20.0000 mg | ORAL_TABLET | Freq: Every day | ORAL | 3 refills | Status: AC

## 2023-10-28 MED ORDER — METOPROLOL SUCCINATE ER 25 MG PO TB24: 25.0000 mg | ORAL_TABLET | Freq: Two times a day (BID) | ORAL | 3 refills | Status: AC

## 2023-10-28 MED ORDER — SEMAGLUTIDE(0.25 OR 0.5MG/DOS) 2 MG/3ML ~~LOC~~ SOPN
0.2500 mg | PEN_INJECTOR | SUBCUTANEOUS | 1 refills | Status: AC
Start: 2023-10-28 — End: ?

## 2023-10-28 MED ORDER — SEMAGLUTIDE(0.25 OR 0.5MG/DOS) 2 MG/3ML ~~LOC~~ SOPN: 0.2500 mg | PEN_INJECTOR | SUBCUTANEOUS | 1 refills | Status: DC

## 2023-10-28 MED ORDER — ROSUVASTATIN CALCIUM 20 MG PO TABS: 20.0000 mg | ORAL_TABLET | Freq: Every day | ORAL | 3 refills | Status: DC

## 2023-10-28 NOTE — Progress Notes (Signed)
 Subjective:    Patient ID: Donna Howell, female    DOB: 15-Apr-1951, 73 y.o.   MRN: [id number]  Patient here for  Chief Complaint  Patient presents with   Annual Exam    HPI With past history of diabetes, hypercholesterolemia and hypertension, she comes in today to follow up on these issues as well as for a complete physical exam. Last visit, wanted to stop ozempic and increase  metformin to two tablets bid. Saw endocrinology 09/23/23 - recommended reclast. Was put on hold due to dental procedures. Had f/u with cardiology 10/23/23. Recent echo - EF 60-65% with G1DD, mild to moderate MR and moderate TR. Losartan was changed to amlodipine over the winter - given raynaud's. She could not tell a great benefit with this and had some mild lower leg swelling at night. Amlodipine was stopped and she was placed back on losartan. Continue toprol. Comes in today stating that the increased metformin is affecting her stomach. Wants to decrease the dose. Wants to restart ozempic. Discussed other treatment options, including jardiance and farxiga. She wants to hold on starting these medications, due to possible increase in UTIs and yeast infections. Discussed side effects of ozempic. Desires to restart. No chest pain reported. Breathing stable. She is having a lot of dental issues. Cracked tooth and infection. Planning to see an oral surgeon 4/28. Eating. No vomiting. Bowels moving.    Past Medical History:  Diagnosis Date   Anemia    Arthritis    Chicken pox    Diabetes (HCC)    Family history of adverse reaction to anesthesia    daughter coded during surgery and son had trouble breathing during surgery   Frequent headaches    GERD (gastroesophageal reflux disease)    Grade I diastolic dysfunction    Hay fever    Heart murmur    High blood pressure    History of bronchitis    Hypothyroidism    IBS (irritable bowel syndrome)    Liver cyst    Mitral valve regurgitation    Moderate mitral  regurgitation by prior echocardiogram    Moderate tricuspid regurgitation by prior echocardiogram    Multilevel degenerative disc disease    Raynaud's disease    Tricuspid regurgitation    Wears dentures    full lower   Past Surgical History:  Procedure Laterality Date   APPENDECTOMY  1974   BREAST BIOPSY Left 01/16/2021   Affirm bx-"Ribbon" clip- PREDOMINANTLY BENIGN ADIPOSE TISSUE. - AREAS OF STROMAL FIBROSIS AND MAMMARY GLANDS WITH ATROPHIC AND APOCRINE CHANGES. - NEGATIVE FOR ATYPIA AND MALIGNANCY.   CATARACT EXTRACTION W/PHACO Left 09/02/2023   Procedure: CATARACT EXTRACTION PHACO AND INTRAOCULAR LENS PLACEMENT (IOC) LEFT DIABETIC MALYUGIN  CLAREON VIVITY TORIC 8.20 00:44.0;  Surgeon: Lockie Mola, MD;  Location: [facility];  Service: Ophthalmology;  Laterality: Left;   CATARACT EXTRACTION W/PHACO Right 09/28/2023   Procedure: CATARACT EXTRACTION PHACO AND INTRAOCULAR LENS PLACEMENT (IOC) RIGHT DIABETIC MALYUGIN  CLAREON VIVITY 6.17 00:41.9;  Surgeon: Lockie Mola, MD;  Location: [facility];  Service: Ophthalmology;  Laterality: Right;   CERVICAL CERCLAGE     DIAGNOSTIC LAPAROSCOPY     SHOULDER ARTHROSCOPY WITH SUBACROMIAL DECOMPRESSION, ROTATOR CUFF REPAIR AND BICEP TENDON REPAIR Right 12/24/2021   Procedure: RIGHT SHOULDER ARTHROSCOPY WITH DEBRIDEMENT, DECOMPRESSION, ROTATOR CUFF RELEASE, AND BICEPS TENODESIS;  Surgeon: Christena Flake, MD;  Location: [facility];  Service: Orthopedics;  Laterality: Right;   TONSILLECTOMY AND ADENOIDECTOMY  1962   Family History  Problem Relation Age of Onset   Arthritis Mother    Heart disease Mother    Diabetes Mother    Stroke Father    Arthritis Sister    Breast cancer Sister 44   Alcoholism Brother    Arthritis Maternal Grandmother    Heart disease Maternal Grandmother    Diabetes Maternal Grandmother    Social History   Socioeconomic History   Marital status: Married    Spouse name: Not on file   Number  of children: Not on file   Years of education: Not on file   Highest education level: Not on file  Occupational History   Not on file  Tobacco Use   Smoking status: Never   Smokeless tobacco: Never  Vaping Use   Vaping status: Never Used  Substance and Sexual Activity   Alcohol use: No   Drug use: No   Sexual activity: Not on file  Other Topics Concern   Not on file  Social History Narrative   Not on file   Social Drivers of Health   Financial Resource Strain: Low Risk  (09/20/2023)   Received from [facility]   Overall Financial Resource Strain (CARDIA)    Difficulty of Paying Living Expenses: Not very hard  Food Insecurity: No Food Insecurity (09/20/2023)   Received from [facility]   Hunger Vital Sign    Worried About Running Out of Food in the Last Year: Never true    Ran Out of Food in the Last Year: Never true  Transportation Needs: No Transportation Needs (09/20/2023)   Received from [facility] - Transportation    In the past 12 months, has lack of transportation kept you from medical appointments or from getting medications?: No    Lack of Transportation (Non-Medical): No  Physical Activity: Sufficiently Active (03/10/2023)   Exercise Vital Sign    Days of Exercise per Week: 5 days    Minutes of Exercise per Session: 30 min  Stress: Stress Concern Present (03/10/2023)   [employer] of Occupational Health - Occupational Stress Questionnaire    Feeling of Stress : To some extent  Social Connections: Moderately Integrated (03/10/2023)   Social Connection and Isolation Panel [NHANES]    Frequency of Communication with Friends and Family: More than three times a week    Frequency of Social Gatherings with Friends and Family: Twice a week    Attends Religious Services: Never    Database administrator or Organizations: Yes    Attends Engineer, structural: More than 4 times per year    Marital  Status: Married     Review of Systems  Constitutional:  Negative for appetite change and unexpected weight change.  HENT:  Negative for congestion, sinus pressure and sore throat.   Eyes:  Negative for pain and visual disturbance.  Respiratory:  Negative for cough, chest tightness and shortness of breath.   Cardiovascular:  Negative for chest pain, palpitations and leg swelling.  Gastrointestinal:  Negative for abdominal pain, diarrhea, nausea and vomiting.  Genitourinary:  Negative for difficulty urinating and dysuria.  Musculoskeletal:  Negative for joint swelling and myalgias.  Skin:  Negative for color change and rash.  Neurological:  Negative for dizziness and headaches.  Hematological:  Negative for adenopathy. Does not bruise/bleed easily.  Psychiatric/Behavioral:  Negative for agitation and dysphoric mood.        Objective:     BP 116/68  Pulse 60   Temp 98 F (36.7 C)   Resp 16   Ht 5\' 4"  (1.626 m)   Wt 117 lb 12.8 oz (53.4 kg)   SpO2 97%   BMI 20.22 kg/m  Wt Readings from Last 3 Encounters:  10/28/23 117 lb 12.8 oz (53.4 kg)  10/23/23 117 lb 4 oz (53.2 kg)  09/28/23 115 lb 15.4 oz (52.6 kg)    Physical Exam Vitals reviewed.  Constitutional:      General: She is not in acute distress.    Appearance: Normal appearance. She is well-developed.  HENT:     Head: Normocephalic and atraumatic.     Right Ear: External ear normal.     Left Ear: External ear normal.     Mouth/Throat:     Pharynx: No oropharyngeal exudate or posterior oropharyngeal erythema.  Eyes:     General: No scleral icterus.       Right eye: No discharge.        Left eye: No discharge.     Conjunctiva/sclera: Conjunctivae normal.  Neck:     Thyroid: No thyromegaly.  Cardiovascular:     Rate and Rhythm: Normal rate and regular rhythm.  Pulmonary:     Effort: No tachypnea, accessory muscle usage or respiratory distress.     Breath sounds: Normal breath sounds. No decreased breath sounds  or wheezing.  Chest:  Breasts:    Right: No inverted nipple, mass, nipple discharge or tenderness (no axillary adenopathy).     Left: No inverted nipple, mass, nipple discharge or tenderness (no axilarry adenopathy).  Abdominal:     General: Bowel sounds are normal.     Palpations: Abdomen is soft.     Tenderness: There is no abdominal tenderness.  Musculoskeletal:        General: No swelling or tenderness.     Cervical back: Neck supple.  Lymphadenopathy:     Cervical: No cervical adenopathy.  Skin:    Findings: No erythema or rash.  Neurological:     Mental Status: She is alert and oriented to person, place, and time.  Psychiatric:        Mood and Affect: Mood normal.        Behavior: Behavior normal.         Outpatient Encounter Medications as of 10/28/2023  Medication Sig   [DISCONTINUED] Semaglutide,0.25 or 0.5MG /DOS, 2 MG/3ML SOPN Inject 0.25 mg into the skin once a week.   aspirin 81 MG chewable tablet Chew 81 mg by mouth daily.   blood glucose meter kit and supplies KIT Dispense based on patient and insurance preference. Use up to four times daily as directed. (Patient not taking: Reported on 10/23/2023)   Calcium-Vitamin D-Vitamin K (CALCIUM + D + K PO) Take by mouth daily.   Cholecalciferol (VITAMIN D3) 25 MCG (1000 UT) CHEW Chew 2,000 Units by mouth in the morning and at bedtime.   Coenzyme Q10 (CO Q 10 PO) Take 1 tablet by mouth daily at 6 (six) AM.   Cranberry-Vitamin C-Probiotic (AZO CRANBERRY PO) Take by mouth in the morning and at bedtime.   Cyanocobalamin (VITAMIN B12 PO) Take 2 tablets by mouth daily at 6 (six) AM. gummies   eszopiclone (LUNESTA) 2 MG TABS tablet TAKE ONE TABLET BY MOUTH ONE TIME DAILY AT BEDTIME AS NEEDED FOR SLEEP   glucose blood (FREESTYLE LITE) test strip Use to check blood sugars three times daily. E11.65 (Patient not taking: Reported on 10/23/2023)   levothyroxine (SYNTHROID) 50 MCG  tablet Take 1 tablet (50 mcg total) by mouth daily.    losartan (COZAAR) 25 MG tablet Take 1 tablet (25 mg total) by mouth daily.   MAGNESIUM PO Take 1 tablet by mouth daily at 6 (six) AM.   metFORMIN (GLUCOPHAGE-XR) 500 MG 24 hr tablet Take 2 tablets bid   metoprolol succinate (TOPROL-XL) 25 MG 24 hr tablet Take 1 tablet (25 mg total) by mouth 2 (two) times daily.   Multiple Vitamin (MULTIVITAMIN) tablet Take 1 tablet by mouth daily.   polycarbophil (FIBERCON) 625 MG tablet Take 625 mg by mouth daily.   Probiotic Product (PROBIOTIC DAILY PO) Take 1 tablet by mouth daily at 6 (six) AM.   rosuvastatin (CRESTOR) 20 MG tablet Take 1 tablet (20 mg total) by mouth daily.   Semaglutide,0.25 or 0.5MG /DOS, 2 MG/3ML SOPN Inject 0.25 mg into the skin once a week.   [DISCONTINUED] alendronate (FOSAMAX) 70 MG tablet TAKE ONE TABLET BY MOUTH EVERY WEEK WITH A FULL GLASS OF WATER ON AN EMPTY STOMACH (Patient not taking: No sig reported)   [DISCONTINUED] Insulin Pen Needle (PEN NEEDLES) 33G X 4 MM MISC Use to inject Ozempic (Patient not taking: Reported on 10/23/2023)   [DISCONTINUED] levothyroxine (SYNTHROID) 50 MCG tablet Take 1 tablet (50 mcg total) by mouth daily.   [DISCONTINUED] levothyroxine (SYNTHROID) 50 MCG tablet Take 1 tablet (50 mcg total) by mouth daily.   [DISCONTINUED] metoprolol succinate (TOPROL-XL) 25 MG 24 hr tablet Take 1 tablet (25 mg total) by mouth 2 (two) times daily.   [DISCONTINUED] metoprolol succinate (TOPROL-XL) 25 MG 24 hr tablet Take 1 tablet (25 mg total) by mouth 2 (two) times daily.   [DISCONTINUED] rosuvastatin (CRESTOR) 20 MG tablet Take 1 tablet (20 mg total) by mouth daily.   [DISCONTINUED] rosuvastatin (CRESTOR) 20 MG tablet Take 1 tablet (20 mg total) by mouth daily.   No facility-administered encounter medications on file as of 10/28/2023.     Lab Results  Component Value Date   WBC 5.3 03/27/2023   HGB 12.6 03/27/2023   HCT 37.8 03/27/2023   PLT 266.0 03/27/2023   GLUCOSE 104 (H) 10/26/2023   CHOL 139 10/26/2023    TRIG 60.0 10/26/2023   HDL 89.50 10/26/2023   LDLCALC 37 10/26/2023   ALT 16 10/26/2023   AST 23 10/26/2023   NA 135 10/26/2023   K 3.9 10/26/2023   CL 99 10/26/2023   CREATININE 0.82 10/26/2023   BUN 11 10/26/2023   CO2 29 10/26/2023   TSH 0.47 10/26/2023   HGBA1C 6.6 (H) 10/26/2023   MICROALBUR <0.7 10/26/2023       Assessment & Plan:  Healthcare maintenance Assessment & Plan: Physical today 10/28/23.  Mammogram 03/17/23 - Birads I.  IFOB 05/2023 - negative.     Hypercholesterolemia Assessment & Plan: Continue crestor.  Follow lipid panel and liver function tests.  No change today.   Orders: -     Lipid panel; Future -     Hepatic function panel; Future -     Basic metabolic panel with GFR; Future  Type 2 diabetes mellitus with hyperglycemia, without long-term current use of insulin (HCC) Assessment & Plan: She wants to restart ozempic. Currently on metformin 500mg  2 tablets bid.  Not tolerating. Increased GI symptoms. She does watch her diet - Low carb diet and exercise.  Recent a1c. A1c - 6.6. discussed treatment options. Declines SGLT 2 inhibitors as outlined. Wants to decrease metformin and restart ozempic. Discussed the need to monitor for side  effects of medication.  Follow.  Call with update. Follow met b and A1c.   Orders: -     Hemoglobin A1c; Future  Tricuspid valve insufficiency, unspecified etiology Assessment & Plan: Had f/u with cardiology 10/23/23. Recent echo - EF 60-65% with G1DD, mild to moderate MR and moderate TR. Losartan was changed to amlodipine over the winter - given raynaud's. She could not tell a great benefit with this and had some mild lower leg swelling at night. Amlodipine was stopped and she was placed back on losartan. Continue toprol.    Stress Assessment & Plan: Increased stress. Discussed.  Does not feel needs any further intervention at this time.  Follow.    Raynaud's disease without gangrene Assessment & Plan: Off amlodipine now.  Back on losartan for her pressure. Hands better in the warmer weather. Follow.    Osteoporosis without current pathological fracture, unspecified osteoporosis type Assessment & Plan:  Saw endocrinology 09/23/23 - recommended reclast. Was put on hold due to dental procedures.    Hypothyroidism, unspecified type Assessment & Plan: On thyroid replacement.  Follow tsh.    Hypertension, essential Assessment & Plan: Off amlodipine. Back on losartan. Follow pressures. Follow metabolic panel.    Other orders -     Semaglutide(0.25 or 0.5MG /DOS); Inject 0.25 mg into the skin once a week.  Dispense: 6 mL; Refill: 1 -     Rosuvastatin Calcium; Take 1 tablet (20 mg total) by mouth daily.  Dispense: 90 tablet; Refill: 3 -     Metoprolol Succinate ER; Take 1 tablet (25 mg total) by mouth 2 (two) times daily.  Dispense: 180 tablet; Refill: 3 -     Levothyroxine Sodium; Take 1 tablet (50 mcg total) by mouth daily.  Dispense: 90 tablet; Refill: 3     Dale Aucilla, MD

## 2023-10-28 NOTE — Assessment & Plan Note (Signed)
 Physical today 10/28/23.  Mammogram 03/17/23 - Birads I.  IFOB 05/2023 - negative.

## 2023-10-28 NOTE — Patient Instructions (Signed)
 Decrease metformin to 1500mg  total per day. Can take 2 in am and one in pm.

## 2023-11-01 ENCOUNTER — Encounter: Payer: Self-pay | Admitting: Internal Medicine

## 2023-11-01 NOTE — Assessment & Plan Note (Signed)
 Off amlodipine now. Back on losartan for her pressure. Hands better in the warmer weather. Follow.

## 2023-11-01 NOTE — Assessment & Plan Note (Signed)
 Continue crestor.  Follow lipid panel and liver function tests.  No change today.

## 2023-11-01 NOTE — Assessment & Plan Note (Signed)
 Off amlodipine. Back on losartan. Follow pressures. Follow metabolic panel.

## 2023-11-01 NOTE — Assessment & Plan Note (Signed)
 Had f/u with cardiology 10/23/23. Recent echo - EF 60-65% with G1DD, mild to moderate MR and moderate TR. Losartan was changed to amlodipine over the winter - given raynaud's. She could not tell a great benefit with this and had some mild lower leg swelling at night. Amlodipine was stopped and she was placed back on losartan. Continue toprol.

## 2023-11-01 NOTE — Assessment & Plan Note (Signed)
 She wants to restart ozempic. Currently on metformin 500mg  2 tablets bid.  Not tolerating. Increased GI symptoms. She does watch her diet - Low carb diet and exercise.  Recent a1c. A1c - 6.6. discussed treatment options. Declines SGLT 2 inhibitors as outlined. Wants to decrease metformin and restart ozempic. Discussed the need to monitor for side effects of medication.  Follow.  Call with update. Follow met b and A1c.

## 2023-11-01 NOTE — Assessment & Plan Note (Signed)
 On thyroid replacement.  Follow tsh.

## 2023-11-01 NOTE — Assessment & Plan Note (Signed)
Increased stress.  Discussed.  Does not feel needs any further intervention at this time.  Follow.

## 2023-11-01 NOTE — Assessment & Plan Note (Signed)
 Saw endocrinology 09/23/23 - recommended reclast. Was put on hold due to dental procedures.

## 2023-11-02 ENCOUNTER — Encounter: Payer: Self-pay | Admitting: Internal Medicine

## 2023-11-02 DIAGNOSIS — Z889 Allergy status to unspecified drugs, medicaments and biological substances status: Secondary | ICD-10-CM

## 2023-11-02 NOTE — Telephone Encounter (Signed)
 It would be a referral to an allergist, I am ok with placing an order for the referral, but I am not sure of their schedule and if could be done before 4/23. Also, regarding gas, is she taking a probiotic daily. If not, would start. Also, gas x can be use prn for gas.

## 2023-11-02 NOTE — Telephone Encounter (Signed)
 I know we will have to refer out for allergy testing but wanted you to see this message before I called her.

## 2023-11-02 NOTE — Telephone Encounter (Signed)
 Pt would like to be referred for allergy testing.

## 2023-11-02 NOTE — Telephone Encounter (Signed)
Order placed for referral to allergist.  °

## 2023-11-03 ENCOUNTER — Other Ambulatory Visit: Payer: Self-pay | Admitting: Emergency Medicine

## 2023-11-03 MED ORDER — LOSARTAN POTASSIUM 25 MG PO TABS: 25.0000 mg | ORAL_TABLET | Freq: Every day | ORAL | 3 refills | Status: AC

## 2023-11-16 ENCOUNTER — Encounter: Payer: Self-pay | Admitting: Internal Medicine

## 2023-11-16 NOTE — Telephone Encounter (Signed)
 Please directly call her. If I am understanding correctly, the oral surgeon is performing a procedure and they are recommending an abx for the procedure. I recommend her letting them know how the abx affected her GI tract and see if they would prescribe a different abx - if needed for the procedure.

## 2023-11-17 NOTE — Telephone Encounter (Signed)
 Left message for Patient to call our office back regarding Dr. Thayne Fine message for her.

## 2023-11-17 NOTE — Telephone Encounter (Signed)
 Spoke with Donna Howell and gave her Dr. Thayne Fine message. Donna Howell is agreeable to speak with the oral surgeon and see if there is a different antibiotic that she can take that may be more gentle on her GI tract.

## 2023-11-18 DIAGNOSIS — T8529XA Other mechanical complication of intraocular lens, initial encounter: Secondary | ICD-10-CM | POA: Diagnosis not present

## 2023-11-19 ENCOUNTER — Other Ambulatory Visit: Payer: Self-pay | Admitting: Internal Medicine

## 2023-11-19 DIAGNOSIS — E119 Type 2 diabetes mellitus without complications: Secondary | ICD-10-CM

## 2023-11-23 ENCOUNTER — Other Ambulatory Visit: Payer: Self-pay | Admitting: Internal Medicine

## 2023-11-23 HISTORY — PX: MULTIPLE TOOTH EXTRACTIONS: SHX2053

## 2023-11-25 ENCOUNTER — Encounter: Payer: Self-pay | Admitting: Ophthalmology

## 2023-11-25 NOTE — Anesthesia Preprocedure Evaluation (Addendum)
 Anesthesia Evaluation  Patient identified by MRN, date of birth, ID band Patient awake    Reviewed: Allergy & Precautions, H&P , NPO status , Patient's Chart, lab work & pertinent test results  History of Anesthesia Complications (+) Family history of anesthesia reactionNegative for: history of anesthetic complications  Airway Mallampati: II  TM Distance: >3 FB Neck ROM: Full    Dental no notable dental hx. (+) Lower Dentures, Missing   Pulmonary neg pulmonary ROS   Pulmonary exam normal breath sounds clear to auscultation       Cardiovascular hypertension, (-) angina (-) Past MI and (-) Cardiac Stents Normal cardiovascular exam(-) dysrhythmias + Valvular Problems/Murmurs  Rhythm:Regular Rate:Normal  08-04-23  1. Left ventricular ejection fraction, by estimation, is 60 to 65%. The  left ventricle has normal function. The left ventricle has no regional  wall motion abnormalities. Left ventricular diastolic parameters are  consistent with Grade I diastolic  dysfunction (impaired relaxation). The average left ventricular global  longitudinal strain is -17.2 %.   2. Right ventricular systolic function is normal. The right ventricular  size is normal. There is normal pulmonary artery systolic pressure. The  estimated right ventricular systolic pressure is 19.3 mmHg.   3. The mitral valve is normal in structure. Mild to moderate mitral valve  regurgitation. No evidence of mitral stenosis.   4. Tricuspid valve regurgitation is moderate.   5. The aortic valve is tricuspid. Aortic valve regurgitation is not  visualized. No aortic stenosis is present.   6. The inferior vena cava is normal in size with greater than 50%  respiratory variability, suggesting right atrial pressure of 3 mmHg.     Neuro/Psych  Headaches negative neurological ROS  negative psych ROS   GI/Hepatic negative GI ROS, Neg liver ROS,GERD  ,,  Endo/Other   diabetesHypothyroidism    Renal/GU negative Renal ROS  negative genitourinary   Musculoskeletal negative musculoskeletal ROS (+) Arthritis ,    Abdominal   Peds negative pediatric ROS (+)  Hematology negative hematology ROS (+) Blood dyscrasia, anemia   Anesthesia Other Findings Right eye cataract surgery 09-28-23 Left eye cataract surgery 09-02-23  Arthritis  Diabetes (HCC)  Frequent headaches GERD (gastroesophageal reflux disease) Hay fever Heart murmur  High blood pressure History of bronchitis  Hypothyroidism Mitral valve regurgitation  Anemia Liver cyst  IBS (irritable bowel syndrome) Family history of adverse reaction to anesthesia  Tricuspid regurgitation Multilevel degenerative disc disease Wears dentures Raynaud's disease  Moderate mitral regurgitation by prior echocardiogram Moderate tricuspid regurgitation by prior echocardiogram  Grade I diastolic dysfunction    Reproductive/Obstetrics negative OB ROS                             Anesthesia Physical Anesthesia Plan  ASA: 3  Anesthesia Plan: MAC   Post-op Pain Management:    Induction: Intravenous  PONV Risk Score and Plan: 2 and Midazolam   Airway Management Planned: Natural Airway and Nasal Cannula  Additional Equipment:   Intra-op Plan:   Post-operative Plan:   Informed Consent: I have reviewed the patients History and Physical, chart, labs and discussed the procedure including the risks, benefits and alternatives for the proposed anesthesia with the patient or authorized representative who has indicated his/her understanding and acceptance.     Dental Advisory Given  Plan Discussed with: Anesthesiologist, CRNA and Surgeon  Anesthesia Plan Comments: (Patient consented for risks of anesthesia including but not limited to:  - adverse reactions to  medications - damage to eyes, teeth, lips or other oral mucosa - nerve damage due to positioning  - sore  throat or hoarseness - Damage to heart, brain, nerves, lungs, other parts of body or loss of life  Patient voiced understanding and assent.)        Anesthesia Quick Evaluation

## 2023-11-27 ENCOUNTER — Encounter: Payer: Self-pay | Admitting: Internal Medicine

## 2023-11-27 ENCOUNTER — Ambulatory Visit (INDEPENDENT_AMBULATORY_CARE_PROVIDER_SITE_OTHER): Admitting: Internal Medicine

## 2023-11-27 VITALS — BP 116/68 | HR 82 | Temp 98.0°F | Resp 16 | Ht 64.0 in | Wt 114.8 lb

## 2023-11-27 DIAGNOSIS — Z7985 Long-term (current) use of injectable non-insulin antidiabetic drugs: Secondary | ICD-10-CM | POA: Diagnosis not present

## 2023-11-27 DIAGNOSIS — E7800 Pure hypercholesterolemia, unspecified: Secondary | ICD-10-CM | POA: Diagnosis not present

## 2023-11-27 DIAGNOSIS — E1165 Type 2 diabetes mellitus with hyperglycemia: Secondary | ICD-10-CM | POA: Diagnosis not present

## 2023-11-27 DIAGNOSIS — I10 Essential (primary) hypertension: Secondary | ICD-10-CM

## 2023-11-27 DIAGNOSIS — F439 Reaction to severe stress, unspecified: Secondary | ICD-10-CM | POA: Diagnosis not present

## 2023-11-27 DIAGNOSIS — Z7984 Long term (current) use of oral hypoglycemic drugs: Secondary | ICD-10-CM | POA: Diagnosis not present

## 2023-11-27 DIAGNOSIS — G479 Sleep disorder, unspecified: Secondary | ICD-10-CM | POA: Diagnosis not present

## 2023-11-27 DIAGNOSIS — I7300 Raynaud's syndrome without gangrene: Secondary | ICD-10-CM

## 2023-11-27 DIAGNOSIS — E039 Hypothyroidism, unspecified: Secondary | ICD-10-CM | POA: Diagnosis not present

## 2023-11-27 LAB — BASIC METABOLIC PANEL WITH GFR
BUN: 8 mg/dL (ref 6–23)
CO2: 29 meq/L (ref 19–32)
Calcium: 9.4 mg/dL (ref 8.4–10.5)
Chloride: 97 meq/L (ref 96–112)
Creatinine, Ser: 0.82 mg/dL (ref 0.40–1.20)
GFR: 71.48 mL/min (ref >60.00)
Glucose, Bld: 104 mg/dL — ABNORMAL HIGH (ref 70–99)
Potassium: 4.5 meq/L (ref 3.5–5.1)
Sodium: 134 meq/L — ABNORMAL LOW (ref 135–145)

## 2023-11-27 LAB — TSH: TSH: 1.5 u[IU]/mL (ref 0.35–5.50)

## 2023-11-27 LAB — HM DIABETES FOOT EXAM

## 2023-11-27 NOTE — Assessment & Plan Note (Signed)
 Increased stress. Discussed.  Does not feel needs any further intervention at this time.  Follow. Notify me if feels needs anything more.

## 2023-11-27 NOTE — Assessment & Plan Note (Signed)
 Continues on lunesta . Follow. No changes at this time.

## 2023-11-27 NOTE — Progress Notes (Signed)
 Subjective:    Patient ID: Donna Howell, female    DOB: Dec 18, 1950, 73 y.o.   MRN: [id number]  Patient here for  Chief Complaint  Patient presents with   Medical Management of Chronic Issues    HPI Here for a scheduled follow up - follow up regarding diabetes, hypercholesterolemia and hypertension. Had f/u with cardiology 10/23/23. Recent echo - EF 60-65% with G1DD, mild to moderate MR and moderate TR. Losartan  was changed to amlodipine  over the winter - given raynaud's. Had some mild lower leg swelling at night. Amlodipine  was stopped and she was placed back on losartan . Continues on toprol . Last visit, started back on ozempic . Metformin  dose decreased. She is tolerating the medication. No significant GI issues. No increased acid if she watches what she eats and eats slowly. No chest pain or sob reported. Stays active. Still walking. Had mouth surgery. Off abx now. Increased stress. Discussed.    Past Medical History:  Diagnosis Date   Anemia    Arthritis    Chicken pox    Diabetes (HCC)    Family history of adverse reaction to anesthesia    daughter coded during surgery and son had trouble breathing during surgery   Frequent headaches    GERD (gastroesophageal reflux disease)    Grade I diastolic dysfunction    Hay fever    Heart murmur    High blood pressure    History of bronchitis    Hypothyroidism    IBS (irritable bowel syndrome)    Liver cyst    Mitral valve regurgitation    Moderate mitral regurgitation by prior echocardiogram    Moderate tricuspid regurgitation by prior echocardiogram    Multilevel degenerative disc disease    Raynaud's disease    Tricuspid regurgitation    Wears dentures    full lower   Past Surgical History:  Procedure Laterality Date   APPENDECTOMY  1974   BREAST BIOPSY Left 01/16/2021   Affirm bx-"Ribbon" clip- PREDOMINANTLY BENIGN ADIPOSE TISSUE. - AREAS OF STROMAL FIBROSIS AND MAMMARY GLANDS WITH ATROPHIC AND APOCRINE CHANGES. -  NEGATIVE FOR ATYPIA AND MALIGNANCY.   CATARACT EXTRACTION W/PHACO Left 09/02/2023   Procedure: CATARACT EXTRACTION PHACO AND INTRAOCULAR LENS PLACEMENT (IOC) LEFT DIABETIC MALYUGIN  CLAREON VIVITY TORIC 8.20 00:44.0;  Surgeon: Annell Kidney, MD;  Location: [facility];  Service: Ophthalmology;  Laterality: Left;   CATARACT EXTRACTION W/PHACO Right 09/28/2023   Procedure: CATARACT EXTRACTION PHACO AND INTRAOCULAR LENS PLACEMENT (IOC) RIGHT DIABETIC MALYUGIN  CLAREON VIVITY 6.17 00:41.9;  Surgeon: Annell Kidney, MD;  Location: [facility];  Service: Ophthalmology;  Laterality: Right;   CERVICAL CERCLAGE     DIAGNOSTIC LAPAROSCOPY     MULTIPLE TOOTH EXTRACTIONS  11/23/2023   SHOULDER ARTHROSCOPY WITH SUBACROMIAL DECOMPRESSION, ROTATOR CUFF REPAIR AND BICEP TENDON REPAIR Right 12/24/2021   Procedure: RIGHT SHOULDER ARTHROSCOPY WITH DEBRIDEMENT, DECOMPRESSION, ROTATOR CUFF RELEASE, AND BICEPS TENODESIS;  Surgeon: Elner Hahn, MD;  Location: [facility];  Service: Orthopedics;  Laterality: Right;   TONSILLECTOMY AND ADENOIDECTOMY  1962   Family History  Problem Relation Age of Onset   Arthritis Mother    Heart disease Mother    Diabetes Mother    Stroke Father    Arthritis Sister    Breast cancer Sister 18   Alcoholism Brother    Arthritis Maternal Grandmother    Heart disease Maternal Grandmother    Diabetes Maternal Grandmother    Social History   Socioeconomic History   Marital status: Married  Spouse name: Not on file   Number of children: Not on file   Years of education: Not on file   Highest education level: Not on file  Occupational History   Not on file  Tobacco Use   Smoking status: Never   Smokeless tobacco: Never  Vaping Use   Vaping status: Never Used  Substance and Sexual Activity   Alcohol use: No   Drug use: No   Sexual activity: Not on file  Other Topics Concern   Not on file  Social History Narrative   Not on file   Social  Drivers of Health   Financial Resource Strain: Low Risk  (09/20/2023)   Received from [facility]   Overall Financial Resource Strain (CARDIA)    Difficulty of Paying Living Expenses: Not very hard  Food Insecurity: No Food Insecurity (09/20/2023)   [facility]   Hunger Vital Sign    Worried About Running Out of Food in the Last Year: Never true    Ran Out of Food in the Last Year: Never true  Transportation Needs: No Transportation Needs (09/20/2023)   Received from [facility] - Transportation    In the past 12 months, has lack of transportation kept you from medical appointments or from getting medications?: No    Lack of Transportation (Non-Medical): No  Physical Activity: Sufficiently Active (03/10/2023)   Exercise Vital Sign    Days of Exercise per Week: 5 days    Minutes of Exercise per Session: 30 min  Stress: Stress Concern Present (03/10/2023)   [employer] of Occupational Health - Occupational Stress Questionnaire    Feeling of Stress : To some extent  Social Connections: Moderately Integrated (03/10/2023)   Social Connection and Isolation Panel [NHANES]    Frequency of Communication with Friends and Family: More than three times a week    Frequency of Social Gatherings with Friends and Family: Twice a week    Attends Religious Services: Never    Database administrator or Organizations: Yes    Attends Engineer, structural: More than 4 times per year    Marital Status: Married     Review of Systems  Constitutional:  Negative for appetite change and unexpected weight change.  HENT:  Negative for congestion and sinus pressure.   Respiratory:  Negative for cough, chest tightness and shortness of breath.   Cardiovascular:  Negative for chest pain, palpitations and leg swelling.  Gastrointestinal:  Negative for abdominal pain, diarrhea, nausea and vomiting.  Genitourinary:  Negative for  difficulty urinating and dysuria.  Musculoskeletal:  Negative for joint swelling and myalgias.  Skin:  Negative for color change and rash.  Neurological:  Negative for dizziness and headaches.  Psychiatric/Behavioral:  Negative for agitation and dysphoric mood.        Increased stress.        Objective:     BP 116/68   Pulse 82   Temp 98 F (36.7 C)   Resp 16   Ht 5\' 4"  (1.626 m)   Wt 114 lb 12.8 oz (52.1 kg)   SpO2 98%   BMI 19.71 kg/m  Wt Readings from Last 3 Encounters:  11/27/23 114 lb 12.8 oz (52.1 kg)  10/28/23 117 lb 12.8 oz (53.4 kg)  10/23/23 117 lb 4 oz (53.2 kg)    Physical Exam Vitals reviewed.  Constitutional:      General: She is not in acute  distress.    Appearance: Normal appearance.  HENT:     Head: Normocephalic and atraumatic.     Right Ear: External ear normal.     Left Ear: External ear normal.     Mouth/Throat:     Pharynx: No oropharyngeal exudate or posterior oropharyngeal erythema.  Eyes:     General: No scleral icterus.       Right eye: No discharge.        Left eye: No discharge.     Conjunctiva/sclera: Conjunctivae normal.  Neck:     Thyroid : No thyromegaly.  Cardiovascular:     Rate and Rhythm: Normal rate and regular rhythm.  Pulmonary:     Effort: No respiratory distress.     Breath sounds: Normal breath sounds. No wheezing.  Abdominal:     General: Bowel sounds are normal.     Palpations: Abdomen is soft.     Tenderness: There is no abdominal tenderness.  Musculoskeletal:        General: No swelling or tenderness.     Cervical back: Neck supple. No tenderness.  Lymphadenopathy:     Cervical: No cervical adenopathy.  Skin:    Findings: No erythema or rash.  Neurological:     Mental Status: She is alert.  Psychiatric:        Mood and Affect: Mood normal.        Behavior: Behavior normal.         Outpatient Encounter Medications as of 11/27/2023  Medication Sig   aspirin 81 MG chewable tablet Chew 81 mg by mouth  daily.   blood glucose meter kit and supplies KIT Dispense based on patient and insurance preference. Use up to four times daily as directed. (Patient not taking: Reported on 10/23/2023)   Calcium -Vitamin D-Vitamin K (CALCIUM  + D + K PO) Take by mouth daily.   Cholecalciferol (VITAMIN D3) 25 MCG (1000 UT) CHEW Chew 2,000 Units by mouth in the morning and at bedtime.   Coenzyme Q10 (CO Q 10 PO) Take 1 tablet by mouth daily at 6 (six) AM.   Cranberry-Vitamin C-Probiotic (AZO CRANBERRY PO) Take by mouth in the morning and at bedtime.   Cyanocobalamin (VITAMIN B12 PO) Take 2 tablets by mouth daily at 6 (six) AM. gummies   eszopiclone  (LUNESTA ) 2 MG TABS tablet TAKE ONE TABLET BY MOUTH ONE TIME DAILY AT BEDTIME AS NEEDED FOR SLEEP   glucose blood (FREESTYLE LITE) test strip USE TO CHECK BLOOD SUGARS THREE TIMES A DAY   levothyroxine  (SYNTHROID ) 50 MCG tablet Take 1 tablet (50 mcg total) by mouth daily.   losartan  (COZAAR ) 25 MG tablet Take 1 tablet (25 mg total) by mouth daily.   MAGNESIUM PO Take 1 tablet by mouth daily at 6 (six) AM.   metFORMIN  (GLUCOPHAGE -XR) 500 MG 24 hr tablet Take 2 tablets bid (Patient taking differently: 500 mg 2 (two) times daily with a meal. Takes 1 tablet AM, 2 tablets PM)   metoprolol  succinate (TOPROL -XL) 25 MG 24 hr tablet Take 1 tablet (25 mg total) by mouth 2 (two) times daily.   Multiple Vitamin (MULTIVITAMIN) tablet Take 1 tablet by mouth daily.   polycarbophil (FIBERCON) 625 MG tablet Take 625 mg by mouth daily.   Probiotic Product (PROBIOTIC DAILY PO) Take 1 tablet by mouth daily at 6 (six) AM.   rosuvastatin  (CRESTOR ) 20 MG tablet Take 1 tablet (20 mg total) by mouth daily.   Semaglutide ,0.25 or 0.5MG /DOS, 2 MG/3ML SOPN Inject 0.25 mg into the  skin once a week.   No facility-administered encounter medications on file as of 11/27/2023.     Lab Results  Component Value Date   WBC 5.3 03/27/2023   HGB 12.6 03/27/2023   HCT 37.8 03/27/2023   PLT 266.0 03/27/2023    GLUCOSE 104 (H) 10/26/2023   CHOL 139 10/26/2023   TRIG 60.0 10/26/2023   HDL 89.50 10/26/2023   LDLCALC 37 10/26/2023   ALT 16 10/26/2023   AST 23 10/26/2023   NA 135 10/26/2023   K 3.9 10/26/2023   CL 99 10/26/2023   CREATININE 0.82 10/26/2023   BUN 11 10/26/2023   CO2 29 10/26/2023   TSH 0.47 10/26/2023   HGBA1C 6.6 (H) 10/26/2023   MICROALBUR <0.7 10/26/2023       Assessment & Plan:  Hypertension, essential Assessment & Plan: On losartan . Blood pressure doing well. Follow pressures. Follow metabolic panel.   Orders: -     Basic metabolic panel with GFR  Hypercholesterolemia Assessment & Plan: Continue crestor .  Follow lipid panel and liver function tests.  No change today. Follow.   Orders: -     TSH  Hypothyroidism, unspecified type Assessment & Plan: On thyroid  replacement. Follow tsh. Check tsh today.    Type 2 diabetes mellitus with hyperglycemia, without long-term current use of insulin (HCC) Assessment & Plan: Back on ozempic  .25mg . Taking metformin  1 in am and 2 in pm. Tolerating. Follow sugars. Follow met b and A1c.    Stress Assessment & Plan: Increased stress. Discussed.  Does not feel needs any further intervention at this time.  Follow. Notify me if feels needs anything more.    Raynaud's disease without gangrene Assessment & Plan: Off amlodipine  now. Back on losartan  for her pressure. Hands better in the warmer weather. No increased problems reported now. Follow    Difficulty sleeping Assessment & Plan: Continues on lunesta . Follow. No changes at this time.       Dellar Fenton, MD

## 2023-11-27 NOTE — Assessment & Plan Note (Signed)
 Off amlodipine  now. Back on losartan  for her pressure. Hands better in the warmer weather. No increased problems reported now. Follow

## 2023-11-27 NOTE — Assessment & Plan Note (Signed)
 Continue crestor .  Follow lipid panel and liver function tests.  No change today. Follow.

## 2023-11-27 NOTE — Assessment & Plan Note (Signed)
On thyroid replacement.  Follow tsh. Check tsh today.

## 2023-11-27 NOTE — Assessment & Plan Note (Signed)
On losartan.  Blood pressure doing well.  Follow pressures.  Follow metabolic panel

## 2023-11-27 NOTE — Assessment & Plan Note (Signed)
 Back on ozempic  .25mg . Taking metformin  1 in am and 2 in pm. Tolerating. Follow sugars. Follow met b and A1c.

## 2023-11-30 ENCOUNTER — Encounter: Payer: Self-pay | Admitting: Internal Medicine

## 2023-11-30 NOTE — Discharge Instructions (Signed)

## 2023-12-02 ENCOUNTER — Ambulatory Visit: Payer: Self-pay | Admitting: Anesthesiology

## 2023-12-02 ENCOUNTER — Encounter
Admission: RE | Disposition: A | Discharge status: Home / Self Care | Payer: Self-pay | Source: Home / Self Care | Attending: Ophthalmology

## 2023-12-02 ENCOUNTER — Ambulatory Visit
Admission: RE | Admit: 2023-12-02 | Discharge: 2023-12-02 | Disposition: A | Discharge status: Home / Self Care | Attending: Ophthalmology | Admitting: Ophthalmology

## 2023-12-02 ENCOUNTER — Other Ambulatory Visit: Payer: Self-pay

## 2023-12-02 ENCOUNTER — Encounter: Payer: Self-pay | Admitting: Ophthalmology

## 2023-12-02 DIAGNOSIS — T859XXA Unspecified complication of internal prosthetic device, implant and graft, initial encounter: Secondary | ICD-10-CM | POA: Insufficient documentation

## 2023-12-02 DIAGNOSIS — Z7984 Long term (current) use of oral hypoglycemic drugs: Secondary | ICD-10-CM | POA: Insufficient documentation

## 2023-12-02 DIAGNOSIS — I081 Rheumatic disorders of both mitral and tricuspid valves: Secondary | ICD-10-CM | POA: Insufficient documentation

## 2023-12-02 DIAGNOSIS — Z8261 Family history of arthritis: Secondary | ICD-10-CM | POA: Insufficient documentation

## 2023-12-02 DIAGNOSIS — I10 Essential (primary) hypertension: Secondary | ICD-10-CM | POA: Insufficient documentation

## 2023-12-02 DIAGNOSIS — Z8249 Family history of ischemic heart disease and other diseases of the circulatory system: Secondary | ICD-10-CM | POA: Diagnosis not present

## 2023-12-02 DIAGNOSIS — E039 Hypothyroidism, unspecified: Secondary | ICD-10-CM | POA: Insufficient documentation

## 2023-12-02 DIAGNOSIS — Y772 Prosthetic and other implants, materials and accessory ophthalmic devices associated with adverse incidents: Secondary | ICD-10-CM | POA: Diagnosis not present

## 2023-12-02 DIAGNOSIS — E119 Type 2 diabetes mellitus without complications: Secondary | ICD-10-CM | POA: Diagnosis not present

## 2023-12-02 DIAGNOSIS — K219 Gastro-esophageal reflux disease without esophagitis: Secondary | ICD-10-CM | POA: Insufficient documentation

## 2023-12-02 DIAGNOSIS — Z7985 Long-term (current) use of injectable non-insulin antidiabetic drugs: Secondary | ICD-10-CM | POA: Diagnosis not present

## 2023-12-02 DIAGNOSIS — T8529XA Other mechanical complication of intraocular lens, initial encounter: Secondary | ICD-10-CM | POA: Diagnosis not present

## 2023-12-02 DIAGNOSIS — M1990 Unspecified osteoarthritis, unspecified site: Secondary | ICD-10-CM | POA: Diagnosis not present

## 2023-12-02 DIAGNOSIS — Z833 Family history of diabetes mellitus: Secondary | ICD-10-CM | POA: Insufficient documentation

## 2023-12-02 HISTORY — PX: REMOVE AND REPLACE LENS: SHX6308

## 2023-12-02 LAB — GLUCOSE, CAPILLARY: Glucose-Capillary: 105 mg/dL — ABNORMAL HIGH (ref 70–99)

## 2023-12-02 SURGERY — REMOVAL OR REPLACEMENT, IOL
Anesthesia: Monitor Anesthesia Care | Site: Eye | Laterality: Right

## 2023-12-02 MED ORDER — TETRACAINE HCL 0.5 % OP SOLN
OPHTHALMIC | Status: AC
  Filled 2023-12-02: qty 4

## 2023-12-02 MED ORDER — ARMC OPHTHALMIC DILATING DROPS
1.0000 | OPHTHALMIC | Status: AC
  Administered 2023-12-02 (×3): 1 via OPHTHALMIC

## 2023-12-02 MED ORDER — SIGHTPATH DOSE#1 NA HYALUR & NA CHOND-NA HYALUR IO KIT
PACK | INTRAOCULAR | Status: DC | PRN
  Administered 2023-12-02 (×2): 1 via OPHTHALMIC

## 2023-12-02 MED ORDER — FENTANYL CITRATE (PF) 100 MCG/2ML IJ SOLN
INTRAMUSCULAR | Status: AC
  Filled 2023-12-02: qty 2

## 2023-12-02 MED ORDER — BRIMONIDINE TARTRATE-TIMOLOL 0.2-0.5 % OP SOLN
OPHTHALMIC | Status: DC | PRN
  Administered 2023-12-02 (×2): 1 [drp] via OPHTHALMIC

## 2023-12-02 MED ORDER — FENTANYL CITRATE (PF) 100 MCG/2ML IJ SOLN
INTRAMUSCULAR | Status: DC | PRN
  Administered 2023-12-02: 50 ug via INTRAVENOUS

## 2023-12-02 MED ORDER — ARMC OPHTHALMIC DILATING DROPS
OPHTHALMIC | Status: AC
  Filled 2023-12-02: qty 0.5

## 2023-12-02 MED ORDER — MIDAZOLAM HCL 2 MG/2ML IJ SOLN
INTRAMUSCULAR | Status: AC
  Filled 2023-12-02: qty 2

## 2023-12-02 MED ORDER — EPINEPHRINE PF 1 MG/ML IJ SOLN
INTRAMUSCULAR | Status: DC | PRN
  Administered 2023-12-02: 21 mL via OPHTHALMIC

## 2023-12-02 MED ORDER — LIDOCAINE HCL (PF) 2 % IJ SOLN
INTRAOCULAR | Status: DC | PRN
  Administered 2023-12-02: 2 mL

## 2023-12-02 MED ORDER — TETRACAINE HCL 0.5 % OP SOLN
1.0000 [drp] | OPHTHALMIC | Status: AC
  Administered 2023-12-02 (×3): 1 [drp] via OPHTHALMIC

## 2023-12-02 MED ORDER — SIGHTPATH DOSE#1 BSS IO SOLN
INTRAOCULAR | Status: DC | PRN
  Administered 2023-12-02: 15 mL via INTRAOCULAR

## 2023-12-02 MED ORDER — MIDAZOLAM HCL 2 MG/2ML IJ SOLN
INTRAMUSCULAR | Status: DC | PRN
  Administered 2023-12-02 (×2): 1 mg via INTRAVENOUS

## 2023-12-02 SURGICAL SUPPLY — 12 items
CATARACT SUITE SIGHTPATH (MISCELLANEOUS) ×1 IMPLANT
FEE CATARACT SUITE SIGHTPATH (MISCELLANEOUS) ×1 IMPLANT
GLOVE BIOGEL PI IND STRL 8 (GLOVE) ×1 IMPLANT
GLOVE SURG LX STRL 7.5 STRW (GLOVE) ×1 IMPLANT
GLOVE SURG PROTEXIS BL SZ6.5 (GLOVE) ×1 IMPLANT
GLOVE SURG SYN 6.5 PF PI BL (GLOVE) ×1 IMPLANT
LENS CLAREON VIVITY IOL 25.0 ×1 IMPLANT
LENS IOL CLRN VT YLW 25.0 IMPLANT
NDL FILTER BLUNT 18X1 1/2 (NEEDLE) ×1 IMPLANT
NEEDLE FILTER BLUNT 18X1 1/2 (NEEDLE) ×1 IMPLANT
SET MST INSTRUMENTS (MISCELLANEOUS) IMPLANT
SYR 3ML LL SCALE MARK (SYRINGE) ×1 IMPLANT

## 2023-12-02 NOTE — Transfer of Care (Signed)
 Immediate Anesthesia Transfer of Care Note  Patient: Donna Howell  Procedure(s) Performed: REMOVAL OR REPLACEMENT, IOL 0.24 00:01.0 (Right: Eye)  Patient Location: PACU  Anesthesia Type: MAC  Level of Consciousness: awake, alert  and patient cooperative  Airway and Oxygen Therapy: Patient Spontanous Breathing and Patient connected to supplemental oxygen  Post-op Assessment: Post-op Vital signs reviewed, Patient's Cardiovascular Status Stable, Respiratory Function Stable, Patent Airway and No signs of Nausea or vomiting  Post-op Vital Signs: Reviewed and stable  Complications: No notable events documented.

## 2023-12-02 NOTE — Op Note (Signed)
 PREOPERATIVE DIAGNOSIS:  complication of intraocular lens  POSTOPERATIVE DIAGNOSIS: same                                                       PROCEDURE:  Intraocular lens exchange Right eye LENS:   CNWET0 25.0 SN [id number]      SURGEON:  Berline Brenner, MD   ANESTHESIA:  Topical with tetracaine  drops and 2% Xylocaine  jelly, augmented with 1% preservative-free intracameral lidocaine .   COMPLICATIONS:  None.   DESCRIPTION OF PROCEDURE:  The patient was identified in the holding room and transported to the operating room and placed in the supine position under the operating microscope. Theright eye was identified as the operative eye and it was prepped and draped in the usual sterile ophthalmic fashion.   A 1 millimeter clear-corneal paracentesis was made at the 12:00 position.  The anterior chamber was filled with Viscoat viscoelastic.  A 2.7 millimeter keratome was used to make a near-clear corneal incision at the 9:00 position. Viscoat was used to dissect the lens from its adherence within the capsular bag superiorly and sulcus inferiorly.  Microforceps were used to elevate the lens out of the capsule into the anterior chamber.  The free haptic was externalized.  The lens was cut in half and both pieces were explanted.   Additional provisc was placed into the chamber and sulcus. A CNWET0 25.0 lens was injected into the capsule.  The viscoelastic was aspirated.   Wounds were hydrated with balanced salt solution.  The anterior chamber was inflated to a physiologic pressure with balanced salt solution.  No wound leaks were noted.Vigamox  0.2 ml of a 1mg  per ml solution was injected into the anterior chamber for a dose of 0.2 mg of intracameral antibiotic at the completion of the case.  Timolol  and Brimonidine  drops were applied to the eye.  The patient was taken to the recovery room in stable condition without complications of anesthesia or surgery.

## 2023-12-02 NOTE — Anesthesia Postprocedure Evaluation (Signed)
 Anesthesia Post Note  Patient: Donna Howell  Procedure(s) Performed: REMOVAL OR REPLACEMENT, IOL 0.24 00:01.0 (Right: Eye)  Patient location during evaluation: PACU Anesthesia Type: MAC Level of consciousness: awake and alert Pain management: pain level controlled Vital Signs Assessment: post-procedure vital signs reviewed and stable Respiratory status: spontaneous breathing, nonlabored ventilation, respiratory function stable and patient connected to nasal cannula oxygen Cardiovascular status: stable and blood pressure returned to baseline Postop Assessment: no apparent nausea or vomiting Anesthetic complications: no   No notable events documented.   Last Vitals:  Vitals:   12/02/23 0941 12/02/23 0944  BP: 117/73 120/66  Pulse: 64 67  Resp: 14 11  Temp: (!) 36.3 C   SpO2: 100% 100%    Last Pain:  Vitals:   12/02/23 0944  TempSrc:   PainSc: 0-No pain                 Vanice Genre

## 2023-12-02 NOTE — H&P (Signed)
 [facility]   Primary Care Physician:  Dellar Fenton, MD Ophthalmologist: Dr. Annell Kidney  Pre-Procedure History & Physical: HPI:  Donna Howell is a 73 y.o. female here for ophthalmic surgery.   Past Medical History:  Diagnosis Date   Anemia    Arthritis    Chicken pox    Diabetes (HCC)    Family history of adverse reaction to anesthesia    daughter coded during surgery and son had trouble breathing during surgery   Frequent headaches    GERD (gastroesophageal reflux disease)    Grade I diastolic dysfunction    Hay fever    Heart murmur    High blood pressure    History of bronchitis    Hypothyroidism    IBS (irritable bowel syndrome)    Liver cyst    Mitral valve regurgitation    Moderate mitral regurgitation by prior echocardiogram    Moderate tricuspid regurgitation by prior echocardiogram    Multilevel degenerative disc disease    Raynaud's disease    Tricuspid regurgitation    Wears dentures    full lower    Past Surgical History:  Procedure Laterality Date   APPENDECTOMY  1974   BREAST BIOPSY Left 01/16/2021   Affirm bx-"Ribbon" clip- PREDOMINANTLY BENIGN ADIPOSE TISSUE. - AREAS OF STROMAL FIBROSIS AND MAMMARY GLANDS WITH ATROPHIC AND APOCRINE CHANGES. - NEGATIVE FOR ATYPIA AND MALIGNANCY.   CATARACT EXTRACTION W/PHACO Left 09/02/2023   Procedure: CATARACT EXTRACTION PHACO AND INTRAOCULAR LENS PLACEMENT (IOC) LEFT DIABETIC MALYUGIN  CLAREON VIVITY TORIC 8.20 00:44.0;  Surgeon: Annell Kidney, MD;  Location: [facility];  Service: Ophthalmology;  Laterality: Left;   CATARACT EXTRACTION W/PHACO Right 09/28/2023   Procedure: CATARACT EXTRACTION PHACO AND INTRAOCULAR LENS PLACEMENT (IOC) RIGHT DIABETIC MALYUGIN  CLAREON VIVITY 6.17 00:41.9;  Surgeon: Annell Kidney, MD;  Location: [facility];  Service: Ophthalmology;  Laterality: Right;   CERVICAL CERCLAGE     DIAGNOSTIC LAPAROSCOPY     MULTIPLE TOOTH EXTRACTIONS   11/23/2023   SHOULDER ARTHROSCOPY WITH SUBACROMIAL DECOMPRESSION, ROTATOR CUFF REPAIR AND BICEP TENDON REPAIR Right 12/24/2021   Procedure: RIGHT SHOULDER ARTHROSCOPY WITH DEBRIDEMENT, DECOMPRESSION, ROTATOR CUFF RELEASE, AND BICEPS TENODESIS;  Surgeon: Elner Hahn, MD;  Location: [facility];  Service: Orthopedics;  Laterality: Right;   TONSILLECTOMY AND ADENOIDECTOMY  1962    Prior to Admission medications   Medication Sig Start Date End Date Taking? Authorizing Provider  aspirin 81 MG chewable tablet Chew 81 mg by mouth daily.   Yes [provider]  Calcium -Vitamin D-Vitamin K (CALCIUM  + D + K PO) Take by mouth daily.   Yes [provider]  Cholecalciferol (VITAMIN D3) 25 MCG (1000 UT) CHEW Chew 2,000 Units by mouth in the morning and at bedtime.   Yes [provider]  Coenzyme Q10 (CO Q 10 PO) Take 1 tablet by mouth daily at 6 (six) AM.   Yes [provider]  Cranberry-Vitamin C-Probiotic (AZO CRANBERRY PO) Take by mouth in the morning and at bedtime.   Yes [provider]  Cyanocobalamin (VITAMIN B12 PO) Take 2 tablets by mouth daily at 6 (six) AM. gummies   Yes [provider]  eszopiclone  (LUNESTA ) 2 MG TABS tablet TAKE ONE TABLET BY MOUTH ONE TIME DAILY AT BEDTIME AS NEEDED FOR SLEEP 11/24/23  Yes Dellar Fenton, MD  levothyroxine  (SYNTHROID ) 50 MCG tablet Take 1 tablet (50 mcg total) by mouth daily. 10/28/23  Yes Dellar Fenton, MD  losartan  (COZAAR ) 25 MG tablet  Take 1 tablet (25 mg total) by mouth daily. 11/03/23 02/01/24 Yes Agbor-Etang, Polly Brink, MD  MAGNESIUM PO Take 1 tablet by mouth daily at 6 (six) AM.   Yes [provider]  metFORMIN  (GLUCOPHAGE -XR) 500 MG 24 hr tablet Take 2 tablets bid Patient taking differently: 500 mg 2 (two) times daily with a meal. Takes 1 tablet AM, 2 tablets PM 07/17/23  Yes Dellar Fenton, MD  metoprolol  succinate (TOPROL -XL) 25 MG 24 hr tablet Take 1 tablet (25 mg total) by mouth 2 (two) times  daily. 10/28/23  Yes Dellar Fenton, MD  Multiple Vitamin (MULTIVITAMIN) tablet Take 1 tablet by mouth daily.   Yes [provider]  polycarbophil (FIBERCON) 625 MG tablet Take 625 mg by mouth daily.   Yes [provider]  Probiotic Product (PROBIOTIC DAILY PO) Take 1 tablet by mouth daily at 6 (six) AM.   Yes [provider]  rosuvastatin  (CRESTOR ) 20 MG tablet Take 1 tablet (20 mg total) by mouth daily. 10/28/23  Yes Dellar Fenton, MD  Semaglutide ,0.25 or 0.5MG /DOS, 2 MG/3ML SOPN Inject 0.25 mg into the skin once a week. 10/28/23  Yes Dellar Fenton, MD  blood glucose meter kit and supplies KIT Dispense based on patient and insurance preference. Use up to four times daily as directed. Patient not taking: Reported on 10/23/2023 02/25/21   Dellar Fenton, MD  glucose blood (FREESTYLE LITE) test strip USE TO CHECK BLOOD SUGARS THREE TIMES A DAY 11/19/23   Dellar Fenton, MD    Allergies as of 11/12/2023 - Review Complete 11/01/2023  Allergen Reaction Noted   Contrast media [iodinated contrast media] Itching and Other (See Comments) 11/06/2016   Buspirone hcl Palpitations 11/20/2020   Penicillins Rash 11/06/2016   Percocet [oxycodone-acetaminophen ] Rash 11/06/2016   Percodan [oxycodone-aspirin] Rash 11/06/2016    Family History  Problem Relation Age of Onset   Arthritis Mother    Heart disease Mother    Diabetes Mother    Stroke Father    Arthritis Sister    Breast cancer Sister 64   Alcoholism Brother    Arthritis Maternal Grandmother    Heart disease Maternal Grandmother    Diabetes Maternal Grandmother     Social History   Socioeconomic History   Marital status: Married    Spouse name: Not on file   Number of children: Not on file   Years of education: Not on file   Highest education level: Not on file  Occupational History   Not on file  Tobacco Use   Smoking status: Never   Smokeless tobacco: Never  Vaping Use   Vaping status: Never Used   Substance and Sexual Activity   Alcohol use: No   Drug use: No   Sexual activity: Not on file  Other Topics Concern   Not on file  Social History Narrative   Not on file   Social Drivers of Health   Financial Resource Strain: Low Risk  (09/20/2023)   Received from [facility]   Overall Financial Resource Strain (CARDIA)    Difficulty of Paying Living Expenses: Not very hard  Food Insecurity: No Food Insecurity (09/20/2023)   Received from [facility]   Hunger Vital Sign    Worried About Running Out of Food in the Last Year: Never true    Ran Out of Food in the Last Year: Never true  Transportation Needs: No Transportation Needs (09/20/2023)   Received from [facility]   [facility] - Transportation  In the past 12 months, has lack of transportation kept you from medical appointments or from getting medications?: No    Lack of Transportation (Non-Medical): No  Physical Activity: Sufficiently Active (03/10/2023)   Exercise Vital Sign    Days of Exercise per Week: 5 days    Minutes of Exercise per Session: 30 min  Stress: Stress Concern Present (03/10/2023)   [employer] of Occupational Health - Occupational Stress Questionnaire    Feeling of Stress : To some extent  Social Connections: Moderately Integrated (03/10/2023)   Social Connection and Isolation Panel [NHANES]    Frequency of Communication with Friends and Family: More than three times a week    Frequency of Social Gatherings with Friends and Family: Twice a week    Attends Religious Services: Never    Database administrator or Organizations: Yes    Attends Engineer, structural: More than 4 times per year    Marital Status: Married  Catering manager Violence: Not At Risk (03/10/2023)   Humiliation, Afraid, Rape, and Kick questionnaire    Fear of Current or Ex-Partner: No    Emotionally Abused: No    Physically Abused: No    Sexually Abused: No     Review of Systems: See HPI, otherwise negative ROS  Physical Exam: BP 132/69   Temp 98.1 F (36.7 C) (Temporal)   Resp 17   Ht 5' 4.02" (1.626 m)   Wt 52.1 kg   SpO2 100%   BMI 19.71 kg/m  General:   Alert,  pleasant and cooperative in NAD Head:  Normocephalic and atraumatic. Lungs:  Clear to auscultation.    Heart:  Regular rate and rhythm.   Impression/Plan: Donna Howell is here for ophthalmic surgery.  Risks, benefits, limitations, and alternatives regarding ophthalmic surgery have been reviewed with the patient.  Questions have been answered.  All parties agreeable.   Annell Kidney, MD  12/02/2023, 8:26 AM

## 2023-12-04 ENCOUNTER — Other Ambulatory Visit: Payer: Self-pay | Admitting: Internal Medicine

## 2023-12-04 NOTE — Telephone Encounter (Signed)
 [facility] - how she is taking - one tablet in am and two tablets in pm.

## 2023-12-08 ENCOUNTER — Encounter: Payer: Self-pay | Admitting: Ophthalmology

## 2023-12-08 ENCOUNTER — Other Ambulatory Visit: Payer: Self-pay

## 2023-12-08 NOTE — Anesthesia Preprocedure Evaluation (Signed)
 Anesthesia Evaluation    Airway Mallampati: II  TM Distance: >3 FB     Dental  (+) Missing, Lower Dentures Lower Dentures, Missing:   Pulmonary           Cardiovascular hypertension,   08-04-23  1. Left ventricular ejection fraction, by estimation, is 60 to 65%. The  left ventricle has normal function. The left ventricle has no regional  wall motion abnormalities. Left ventricular diastolic parameters are  consistent with Grade I diastolic  dysfunction (impaired relaxation). The average left ventricular global  longitudinal strain is -17.2 %.   2. Right ventricular systolic function is normal. The right ventricular  size is normal. There is normal pulmonary artery systolic pressure. The  estimated right ventricular systolic pressure is 19.3 mmHg.   3. The mitral valve is normal in structure. Mild to moderate mitral valve  regurgitation. No evidence of mitral stenosis.   4. Tricuspid valve regurgitation is moderate.   5. The aortic valve is tricuspid. Aortic valve regurgitation is not  visualized. No aortic stenosis is present.   6. The inferior vena cava is normal in size with greater than 50%  respiratory variability, suggesting right atrial pressure of 3 mmHg.     Neuro/Psych    GI/Hepatic   Endo/Other  diabetes    Renal/GU      Musculoskeletal   Abdominal   Peds  Hematology   Anesthesia Other Findings Previous cataract surgery 09-28-23 initial right eye cataract surgery, Dr. Aldo Amble anesthesiologist  and 12-02-23 removal/replacement cataract surgery right eye Dr. Paz Bott  This is scheduled as removal/replacement left eye cataract  Arthritis  Chicken pox Diabetes (HCC)  Frequent headaches GERD (gastroesophageal reflux disease) Hay fever Heart murmur  High blood pressure History of bronchitis  Hypothyroidism Mitral valve regurgitation  Anemia Liver cyst IBS (irritable bowel syndrome) Family history of  adverse reaction to anesthesia Tricuspid regurgitation Multilevel degenerative disc disease Wears dentures Raynaud's disease  Moderate mitral regurgitation by prior echocardiogram Moderate tricuspid regurgitation by prior echocardiogram  Grade I diastolic dysfunction     Reproductive/Obstetrics                              Anesthesia Physical Anesthesia Plan  ASA: 3  Anesthesia Plan: MAC   Post-op Pain Management:    Induction: Intravenous  PONV Risk Score and Plan:   Airway Management Planned: Natural Airway and Nasal Cannula  Additional Equipment:   Intra-op Plan:   Post-operative Plan:   Informed Consent: I have reviewed the patients History and Physical, chart, labs and discussed the procedure including the risks, benefits and alternatives for the proposed anesthesia with the patient or authorized representative who has indicated his/her understanding and acceptance.     Dental Advisory Given  Plan Discussed with: Anesthesiologist, CRNA and Surgeon  Anesthesia Plan Comments: (Patient consented for risks of anesthesia including but not limited to:  - adverse reactions to medications - damage to eyes, teeth, lips or other oral mucosa - nerve damage due to positioning  - sore throat or hoarseness - Damage to heart, brain, nerves, lungs, other parts of body or loss of life  Patient voiced understanding and assent.)         Anesthesia Quick Evaluation

## 2023-12-11 ENCOUNTER — Encounter: Payer: Self-pay | Admitting: Internal Medicine

## 2023-12-11 NOTE — Telephone Encounter (Signed)
 FYI Dr Geralyn Knee  Called patient to confirm doing ok. Stress is increased. She is not having acute symptoms. She is having ongoing issues with her recent eye surgery. She is blind in one eye now after having surgery. Wanted to set up an appointment just to talk to Dr Geralyn Knee. She says Dr Geralyn Knee always helps to give her reassurance and talk her down when she is stressed. Advised that Dr Geralyn Knee is out of office Monday and I am going to hold this message for cancellation to try to work her in. Pt was agreeable to this and ok with plan. She will continue to monitor her blood pressures as well.

## 2023-12-12 NOTE — Telephone Encounter (Signed)
 Noted

## 2023-12-15 NOTE — Telephone Encounter (Signed)
Pt scheduled for this week.

## 2023-12-16 ENCOUNTER — Ambulatory Visit: Admit: 2023-12-16 | Admitting: Ophthalmology

## 2023-12-16 ENCOUNTER — Encounter: Payer: Self-pay | Admitting: Anesthesiology

## 2023-12-16 SURGERY — REMOVAL OR REPLACEMENT, IOL
Anesthesia: Topical | Laterality: Left

## 2023-12-18 ENCOUNTER — Encounter: Payer: Self-pay | Admitting: Internal Medicine

## 2023-12-18 ENCOUNTER — Ambulatory Visit (INDEPENDENT_AMBULATORY_CARE_PROVIDER_SITE_OTHER): Admitting: Internal Medicine

## 2023-12-18 VITALS — BP 130/80 | HR 70 | Ht 64.0 in | Wt 116.0 lb

## 2023-12-18 DIAGNOSIS — E7800 Pure hypercholesterolemia, unspecified: Secondary | ICD-10-CM

## 2023-12-18 DIAGNOSIS — Z7985 Long-term (current) use of injectable non-insulin antidiabetic drugs: Secondary | ICD-10-CM

## 2023-12-18 DIAGNOSIS — F439 Reaction to severe stress, unspecified: Secondary | ICD-10-CM

## 2023-12-18 DIAGNOSIS — E1165 Type 2 diabetes mellitus with hyperglycemia: Secondary | ICD-10-CM

## 2023-12-18 DIAGNOSIS — Z7984 Long term (current) use of oral hypoglycemic drugs: Secondary | ICD-10-CM | POA: Diagnosis not present

## 2023-12-18 DIAGNOSIS — I10 Essential (primary) hypertension: Secondary | ICD-10-CM

## 2023-12-18 MED ORDER — AMLODIPINE BESYLATE 2.5 MG PO TABS
2.5000 mg | ORAL_TABLET | Freq: Every day | ORAL | 2 refills | Status: DC
Start: 2023-12-18 — End: 2024-02-15

## 2023-12-18 NOTE — Progress Notes (Unsigned)
 Subjective:    Patient ID: Donna Howell, female    DOB: August 18, 1950, 73 y.o.   MRN: [id number]  Patient here for  Chief Complaint  Patient presents with  . Stress    Increased stress    HPI Here for work in appt - work in regarding increased stress. Had recent oral surgery and eye surgery. Blood pressure has been elevated.    Past Medical History:  Diagnosis Date  . Anemia   . Arthritis   . Chicken pox   . Diabetes (HCC)   . Family history of adverse reaction to anesthesia    daughter coded during surgery and son had trouble breathing during surgery  . Frequent headaches   . GERD (gastroesophageal reflux disease)   . Grade I diastolic dysfunction   . Hay fever   . Heart murmur   . High blood pressure   . History of bronchitis   . Hypothyroidism   . IBS (irritable bowel syndrome)   . Liver cyst   . Mitral valve regurgitation   . Moderate mitral regurgitation by prior echocardiogram   . Moderate tricuspid regurgitation by prior echocardiogram   . Multilevel degenerative disc disease   . Raynaud's disease   . Tricuspid regurgitation   . Wears dentures    full lower   Past Surgical History:  Procedure Laterality Date  . APPENDECTOMY  1974  . BREAST BIOPSY Left 01/16/2021   Affirm bx-"Ribbon" clip- PREDOMINANTLY BENIGN ADIPOSE TISSUE. - AREAS OF STROMAL FIBROSIS AND MAMMARY GLANDS WITH ATROPHIC AND APOCRINE CHANGES. - NEGATIVE FOR ATYPIA AND MALIGNANCY.  Aaron Aas CATARACT EXTRACTION W/PHACO Left 09/02/2023   Procedure: CATARACT EXTRACTION PHACO AND INTRAOCULAR LENS PLACEMENT (IOC) LEFT DIABETIC MALYUGIN  CLAREON VIVITY TORIC 8.20 00:44.0;  Surgeon: Annell Kidney, MD;  Location: [facility];  Service: Ophthalmology;  Laterality: Left;  . CATARACT EXTRACTION W/PHACO Right 09/28/2023   Procedure: CATARACT EXTRACTION PHACO AND INTRAOCULAR LENS PLACEMENT (IOC) RIGHT DIABETIC MALYUGIN  CLAREON VIVITY 6.17 00:41.9;  Surgeon: Annell Kidney, MD;  Location:  [facility];  Service: Ophthalmology;  Laterality: Right;  . CERVICAL CERCLAGE    . DIAGNOSTIC LAPAROSCOPY    . MULTIPLE TOOTH EXTRACTIONS  11/23/2023  . REMOVE AND REPLACE LENS Right 12/02/2023   Procedure: REMOVAL OR REPLACEMENT, IOL 0.24 00:01.0;  Surgeon: Annell Kidney, MD;  Location: [facility];  Service: Ophthalmology;  Laterality: Right;  . SHOULDER ARTHROSCOPY WITH SUBACROMIAL DECOMPRESSION, ROTATOR CUFF REPAIR AND BICEP TENDON REPAIR Right 12/24/2021   Procedure: RIGHT SHOULDER ARTHROSCOPY WITH DEBRIDEMENT, DECOMPRESSION, ROTATOR CUFF RELEASE, AND BICEPS TENODESIS;  Surgeon: Elner Hahn, MD;  Location: [facility];  Service: Orthopedics;  Laterality: Right;  . TONSILLECTOMY AND ADENOIDECTOMY  1962   Family History  Problem Relation Age of Onset  . Arthritis Mother   . Heart disease Mother   . Diabetes Mother   . Stroke Father   . Arthritis Sister   . Breast cancer Sister 80  . Alcoholism Brother   . Arthritis Maternal Grandmother   . Heart disease Maternal Grandmother   . Diabetes Maternal Grandmother    Social History   Socioeconomic History  . Marital status: Married    Spouse name: Not on file  . Number of children: Not on file  . Years of education: Not on file  . Highest education level: Not on file  Occupational History  . Not on file  Tobacco Use  . Smoking status: Never  . Smokeless tobacco: Never  Vaping Use  .  Vaping status: Never Used  Substance and Sexual Activity  . Alcohol use: No  . Drug use: No  . Sexual activity: Not on file  Other Topics Concern  . Not on file  Social History Narrative  . Not on file   Social Drivers of Health   Financial Resource Strain: Low Risk  (09/20/2023)   Received from [facility]   Overall Financial Resource Strain (CARDIA)   . Difficulty of Paying Living Expenses: Not very hard  Food Insecurity: No Food Insecurity (09/20/2023)   Received from [facility]    Hunger Vital Sign   . Worried About Programme researcher, broadcasting/film/video in the Last Year: Never true   . Ran Out of Food in the Last Year: Never true  Transportation Needs: No Transportation Needs (09/20/2023)   Received from [facility]   [facility] - [facility]   . In the past 12 months, has lack of transportation kept you from medical appointments or from getting medications?: No   . Lack of Transportation (Non-Medical): No  Physical Activity: Sufficiently Active (03/10/2023)   Exercise Vital Sign   . Days of Exercise per Week: 5 days   . Minutes of Exercise per Session: 30 min  Stress: Stress Concern Present (03/10/2023)   [employer] of Occupational Health - Occupational Stress Questionnaire   . Feeling of Stress : To some extent  Social Connections: Moderately Integrated (03/10/2023)   Social Connection and Isolation Panel [NHANES]   . Frequency of Communication with Friends and Family: More than three times a week   . Frequency of Social Gatherings with Friends and Family: Twice a week   . Attends Religious Services: Never   . Active Member of Clubs or Organizations: Yes   . Attends Banker Meetings: More than 4 times per year   . Marital Status: Married     Review of Systems     Objective:     BP 130/80   Pulse 70   Ht 5\' 4"  (1.626 m)   Wt 116 lb (52.6 kg)   SpO2 98%   BMI 19.91 kg/m  Wt Readings from Last 3 Encounters:  12/18/23 116 lb (52.6 kg)  12/02/23 114 lb 14.4 oz (52.1 kg)  11/27/23 114 lb 12.8 oz (52.1 kg)    Physical Exam  {Perform Simple Foot Exam  Perform Detailed exam:1} {Insert foot Exam (Optional):30965}   Outpatient Encounter Medications as of 12/18/2023  Medication Sig  . aspirin 81 MG chewable tablet Chew 81 mg by mouth daily.  . blood glucose meter kit and supplies KIT Dispense based on patient and insurance preference. Use up to four times daily as directed.  . Calcium -Vitamin D-Vitamin K (CALCIUM  + D + K PO) Take  by mouth daily.  . carboxymethylcellul-glycerin (REFRESH OPTIVE) 0.5-0.9 % ophthalmic solution Place 1 drop into both eyes.  . Cholecalciferol (VITAMIN D3) 25 MCG (1000 UT) CHEW Chew 2,000 Units by mouth in the morning and at bedtime.  . Coenzyme Q10 (CO Q 10 PO) Take 1 tablet by mouth daily at 6 (six) AM.  . Cranberry-Vitamin C-Probiotic (AZO CRANBERRY PO) Take by mouth in the morning and at bedtime.  . Cyanocobalamin (VITAMIN B12 PO) Take 2 tablets by mouth daily at 6 (six) AM. gummies  . Difluprednate 0.05 % EMUL   . eszopiclone  (LUNESTA ) 2 MG TABS tablet TAKE ONE TABLET BY MOUTH ONE TIME DAILY AT BEDTIME AS NEEDED FOR SLEEP  . glucose blood (FREESTYLE  LITE) test strip USE TO CHECK BLOOD SUGARS THREE TIMES A DAY  . levothyroxine  (SYNTHROID ) 50 MCG tablet Take 1 tablet (50 mcg total) by mouth daily.  . losartan  (COZAAR ) 25 MG tablet Take 1 tablet (25 mg total) by mouth daily.  Aaron Aas MAGNESIUM PO Take 1 tablet by mouth daily at 6 (six) AM.  . metFORMIN  (GLUCOPHAGE -XR) 500 MG 24 hr tablet Takes 1 tablet AM, 2 tablets PM  . metoprolol  succinate (TOPROL -XL) 25 MG 24 hr tablet Take 1 tablet (25 mg total) by mouth 2 (two) times daily.  . Multiple Vitamin (MULTIVITAMIN) tablet Take 1 tablet by mouth daily.  Aaron Aas MURO 128 5 % ophthalmic ointment APPLY ONE FOURTH (1/4) INCH INTO THE RIGHT EYE TWO TIMES A DAY  . polycarbophil (FIBERCON) 625 MG tablet Take 625 mg by mouth daily.  . Probiotic Product (PROBIOTIC DAILY PO) Take 1 tablet by mouth daily at 6 (six) AM.  . rosuvastatin  (CRESTOR ) 20 MG tablet Take 1 tablet (20 mg total) by mouth daily.  . Semaglutide ,0.25 or 0.5MG /DOS, 2 MG/3ML SOPN Inject 0.25 mg into the skin once a week.   No facility-administered encounter medications on file as of 12/18/2023.     Lab Results  Component Value Date   WBC 5.3 03/27/2023   HGB 12.6 03/27/2023   HCT 37.8 03/27/2023   PLT 266.0 03/27/2023   GLUCOSE 104 (H) 11/27/2023   CHOL 139 10/26/2023   TRIG 60.0  10/26/2023   HDL 89.50 10/26/2023   LDLCALC 37 10/26/2023   ALT 16 10/26/2023   AST 23 10/26/2023   NA 134 (L) 11/27/2023   K 4.5 11/27/2023   CL 97 11/27/2023   CREATININE 0.82 11/27/2023   BUN 8 11/27/2023   CO2 29 11/27/2023   TSH 1.50 11/27/2023   HGBA1C 6.6 (H) 10/26/2023   MICROALBUR <0.7 10/26/2023    No results found.     Assessment & Plan:  There are no diagnoses linked to this encounter.   Dellar Fenton, MD

## 2023-12-18 NOTE — Patient Instructions (Signed)
Pepcid (famotidine) - 20mg  - take one tablet 30 minutes before evening meal.

## 2023-12-21 ENCOUNTER — Encounter: Payer: Self-pay | Admitting: Internal Medicine

## 2023-12-21 NOTE — Assessment & Plan Note (Signed)
Increased stress as outlined.  Discussed.  Does not feel needs any further intervention at this time.  Follow.  

## 2023-12-21 NOTE — Assessment & Plan Note (Signed)
 Back on ozempic  .25mg . Taking metformin  1 in am and 2 in pm. Tolerating. Follow sugars. Follow met b and A1c. She is concerned that her sugars are elevated. Discussed medications. Hold on changing medications at this time. Follow.

## 2023-12-21 NOTE — Assessment & Plan Note (Signed)
 On losartan . Blood pressure elevated. Restart amlodipine  2.5mg  q day. Follow pressures. Follow metabolic panel.

## 2023-12-21 NOTE — Assessment & Plan Note (Signed)
 Continue crestor .  Follow lipid panel and liver function tests. Follow.

## 2023-12-23 ENCOUNTER — Other Ambulatory Visit: Payer: Self-pay | Admitting: Internal Medicine

## 2023-12-23 DIAGNOSIS — E119 Type 2 diabetes mellitus without complications: Secondary | ICD-10-CM

## 2023-12-25 ENCOUNTER — Encounter: Payer: Self-pay | Admitting: Internal Medicine

## 2023-12-25 DIAGNOSIS — Z136 Encounter for screening for cardiovascular disorders: Secondary | ICD-10-CM

## 2023-12-28 NOTE — Telephone Encounter (Signed)
Order placed for CT calcium score.  

## 2023-12-28 NOTE — Telephone Encounter (Signed)
 Thank her for the update. Blood pressures look good. Let her know that the order has been placed. Someone should be contacting her with an appt date and time.

## 2024-01-08 DIAGNOSIS — H1811 Bullous keratopathy, right eye: Secondary | ICD-10-CM | POA: Diagnosis not present

## 2024-01-11 ENCOUNTER — Ambulatory Visit
Admission: RE | Admit: 2024-01-11 | Discharge: 2024-01-11 | Disposition: A | Discharge status: Home / Self Care | Payer: Self-pay | Source: Ambulatory Visit | Attending: Internal Medicine | Admitting: Internal Medicine

## 2024-01-11 ENCOUNTER — Ambulatory Visit: Payer: Self-pay | Admitting: Internal Medicine

## 2024-01-11 DIAGNOSIS — Z136 Encounter for screening for cardiovascular disorders: Secondary | ICD-10-CM | POA: Insufficient documentation

## 2024-01-22 ENCOUNTER — Other Ambulatory Visit: Payer: Self-pay | Admitting: Internal Medicine

## 2024-01-27 ENCOUNTER — Other Ambulatory Visit

## 2024-01-27 DIAGNOSIS — E7800 Pure hypercholesterolemia, unspecified: Secondary | ICD-10-CM

## 2024-01-27 DIAGNOSIS — E1165 Type 2 diabetes mellitus with hyperglycemia: Secondary | ICD-10-CM

## 2024-01-27 LAB — BASIC METABOLIC PANEL WITH GFR
BUN: 12 mg/dL (ref 6–23)
CO2: 29 meq/L (ref 19–32)
Calcium: 9.8 mg/dL (ref 8.4–10.5)
Chloride: 96 meq/L (ref 96–112)
Creatinine, Ser: 0.89 mg/dL (ref 0.40–1.20)
GFR: 64.71 mL/min (ref >60.00)
Glucose, Bld: 119 mg/dL — ABNORMAL HIGH (ref 70–99)
Potassium: 4.1 meq/L (ref 3.5–5.1)
Sodium: 133 meq/L — ABNORMAL LOW (ref 135–145)

## 2024-01-27 LAB — LIPID PANEL
Cholesterol: 139 mg/dL (ref 0–200)
HDL: 83.1 mg/dL (ref >39.00)
LDL Cholesterol: 41 mg/dL (ref 0–99)
NonHDL: 55.69
Total CHOL/HDL Ratio: 2
Triglycerides: 72 mg/dL (ref 0.0–149.0)
VLDL: 14.4 mg/dL (ref 0.0–40.0)

## 2024-01-27 LAB — HEPATIC FUNCTION PANEL
ALT: 15 U/L (ref 0–35)
AST: 27 U/L (ref 0–37)
Albumin: 4.8 g/dL (ref 3.5–5.2)
Alkaline Phosphatase: 45 U/L (ref 39–117)
Bilirubin, Direct: 0.1 mg/dL (ref 0.0–0.3)
Total Bilirubin: 0.6 mg/dL (ref 0.2–1.2)
Total Protein: 7.2 g/dL (ref 6.0–8.3)

## 2024-01-27 LAB — HEMOGLOBIN A1C: Hgb A1c MFr Bld: 6.7 % — ABNORMAL HIGH (ref 4.6–6.5)

## 2024-01-28 ENCOUNTER — Ambulatory Visit: Payer: Self-pay | Admitting: Internal Medicine

## 2024-02-02 ENCOUNTER — Encounter: Payer: Self-pay | Admitting: Internal Medicine

## 2024-02-02 ENCOUNTER — Ambulatory Visit (INDEPENDENT_AMBULATORY_CARE_PROVIDER_SITE_OTHER): Admitting: Internal Medicine

## 2024-02-02 VITALS — BP 120/74 | HR 65 | Temp 97.6°F | Resp 20 | Ht 64.0 in | Wt 113.2 lb

## 2024-02-02 DIAGNOSIS — E7800 Pure hypercholesterolemia, unspecified: Secondary | ICD-10-CM | POA: Diagnosis not present

## 2024-02-02 DIAGNOSIS — E039 Hypothyroidism, unspecified: Secondary | ICD-10-CM

## 2024-02-02 DIAGNOSIS — M810 Age-related osteoporosis without current pathological fracture: Secondary | ICD-10-CM | POA: Diagnosis not present

## 2024-02-02 DIAGNOSIS — I7300 Raynaud's syndrome without gangrene: Secondary | ICD-10-CM | POA: Diagnosis not present

## 2024-02-02 DIAGNOSIS — E871 Hypo-osmolality and hyponatremia: Secondary | ICD-10-CM | POA: Diagnosis not present

## 2024-02-02 DIAGNOSIS — F439 Reaction to severe stress, unspecified: Secondary | ICD-10-CM | POA: Diagnosis not present

## 2024-02-02 DIAGNOSIS — I10 Essential (primary) hypertension: Secondary | ICD-10-CM

## 2024-02-02 DIAGNOSIS — E1165 Type 2 diabetes mellitus with hyperglycemia: Secondary | ICD-10-CM

## 2024-02-02 MED ORDER — METFORMIN HCL ER 500 MG PO TB24: ORAL_TABLET | ORAL | 1 refills | Status: DC

## 2024-02-02 NOTE — Progress Notes (Signed)
 Subjective:    Patient ID: Donna Howell, female    DOB: [birth year], 73 y.o.   MRN: [id number]  Patient here for  Chief Complaint  Patient presents with   Medical Management of Chronic Issues    2 month follow up    HPI Here for a scheduled follow up - follow up regarding her diabetes, hypertension, hypercholesterolemia and increased stress. Recent calcium  score 19. Continues on crestor . Stays active. No chest pain or sob reported. She has an oral surgery planned. Also, planning two eye surgeries. Discussed her recent labs. She is concerned her A1c is 6.7. she is wanting to increase ozempic  dose. She has lost weight. Planning the upcoming procedures. Discussed wanting to hold on making big changes in her medication prior to her upcoming procedures.    Past Medical History:  Diagnosis Date   Allergy 1974   Anemia    Arthritis    Chicken pox    Clotting disorder (HCC) 1962   Abnormal clotting time   Diabetes (HCC)    Family history of adverse reaction to anesthesia    daughter coded during surgery and son had trouble breathing during surgery   Frequent headaches    GERD (gastroesophageal reflux disease)    Grade I diastolic dysfunction    Hay fever    Heart murmur    High blood pressure    History of bronchitis    Hypothyroidism    IBS (irritable bowel syndrome)    Liver cyst    Mitral valve regurgitation    Moderate mitral regurgitation by prior echocardiogram    Moderate tricuspid regurgitation by prior echocardiogram    Multilevel degenerative disc disease    Raynaud's disease    Substance abuse (HCC)    Tricuspid regurgitation    Wears dentures    full lower   Past Surgical History:  Procedure Laterality Date   APPENDECTOMY  1974   BREAST BIOPSY Left 01/16/2021   Affirm bx-Ribbon clip- PREDOMINANTLY BENIGN ADIPOSE TISSUE. - AREAS OF STROMAL FIBROSIS AND MAMMARY GLANDS WITH ATROPHIC AND APOCRINE CHANGES. - NEGATIVE FOR ATYPIA AND MALIGNANCY.   CATARACT  EXTRACTION W/PHACO Left 09/02/2023   Procedure: CATARACT EXTRACTION PHACO AND INTRAOCULAR LENS PLACEMENT (IOC) LEFT DIABETIC MALYUGIN  CLAREON VIVITY TORIC 8.20 00:44.0;  Surgeon: Mittie Gaskin, MD;  Location: [facility];  Service: Ophthalmology;  Laterality: Left;   CATARACT EXTRACTION W/PHACO Right 09/28/2023   Procedure: CATARACT EXTRACTION PHACO AND INTRAOCULAR LENS PLACEMENT (IOC) RIGHT DIABETIC MALYUGIN  CLAREON VIVITY 6.17 00:41.9;  Surgeon: Mittie Gaskin, MD;  Location: [facility];  Service: Ophthalmology;  Laterality: Right;   CERVICAL CERCLAGE     DIAGNOSTIC LAPAROSCOPY     MULTIPLE TOOTH EXTRACTIONS  11/23/2023   REMOVE AND REPLACE LENS Right 12/02/2023   Procedure: REMOVAL OR REPLACEMENT, IOL 0.24 00:01.0;  Surgeon: Mittie Gaskin, MD;  Location: [facility];  Service: Ophthalmology;  Laterality: Right;   SHOULDER ARTHROSCOPY WITH SUBACROMIAL DECOMPRESSION, ROTATOR CUFF REPAIR AND BICEP TENDON REPAIR Right 12/24/2021   Procedure: RIGHT SHOULDER ARTHROSCOPY WITH DEBRIDEMENT, DECOMPRESSION, ROTATOR CUFF RELEASE, AND BICEPS TENODESIS;  Surgeon: Edie Norleen PARAS, MD;  Location: [facility];  Service: Orthopedics;  Laterality: Right;   TONSILLECTOMY AND ADENOIDECTOMY  1962   Family History  Problem Relation Age of Onset   Arthritis Mother    Heart disease Mother    Diabetes Mother    Hypertension Mother    Stroke Father    Heart disease Father    Arthritis Sister  Breast cancer Sister 62   Alcoholism Brother    Alcohol abuse Brother        Died at 36   Arthritis Maternal Grandmother    Heart disease Maternal Grandmother    Diabetes Maternal Grandmother    Hypertension Maternal Grandmother    Heart disease Maternal Grandfather    Social History   Socioeconomic History   Marital status: Married    Spouse name: Not on file   Number of children: Not on file   Years of education: Not on file   Highest education level: Not on file   Occupational History   Not on file  Tobacco Use   Smoking status: Never   Smokeless tobacco: Never  Vaping Use   Vaping status: Never Used  Substance and Sexual Activity   Alcohol use: No   Drug use: No   Sexual activity: Not on file  Other Topics Concern   Not on file  Social History Narrative   Not on file   Social Drivers of Health   Financial Resource Strain: Low Risk  (09/20/2023)   Received from [facility]   Overall Financial Resource Strain (CARDIA)    Difficulty of Paying Living Expenses: Not very hard  Food Insecurity: No Food Insecurity (09/20/2023)   Received from [facility]   Hunger Vital Sign    Within the past 12 months, you worried that your food would run out before you got the money to buy more.: Never true    Within the past 12 months, the food you bought just didn't last and you didn't have money to get more.: Never true  Transportation Needs: No Transportation Needs (09/20/2023)   Received from [facility] - Transportation    In the past 12 months, has lack of transportation kept you from medical appointments or from getting medications?: No    Lack of Transportation (Non-Medical): No  Physical Activity: Sufficiently Active (03/10/2023)   Exercise Vital Sign    Days of Exercise per Week: 5 days    Minutes of Exercise per Session: 30 min  Stress: Stress Concern Present (03/10/2023)   [employer]ational [employer] - Occupational Stress Questionnaire    Feeling of Stress : To some extent  Social Connections: Moderately Integrated (03/10/2023)   Social Connection and Isolation Panel    Frequency of Communication with Friends and Family: More than three times a week    Frequency of Social Gatherings with Friends and Family: Twice a week    Attends Religious Services: Never    Database administrator or Organizations: Yes    Attends Engineer, structural: More than 4 times per  year    Marital Status: Married     Review of Systems  Constitutional:        She is eating. Has lost weight.   HENT:  Negative for congestion and sinus pressure.   Respiratory:  Negative for cough, chest tightness and shortness of breath.   Cardiovascular:  Negative for chest pain, palpitations and leg swelling.  Gastrointestinal:  Negative for abdominal pain, diarrhea, nausea and vomiting.  Genitourinary:  Negative for difficulty urinating and dysuria.  Musculoskeletal:  Negative for joint swelling and myalgias.  Skin:  Negative for color change and rash.  Neurological:  Negative for dizziness and headaches.  Psychiatric/Behavioral:  Negative for agitation and dysphoric mood.        Objective:     BP 120/74  Pulse 65   Temp 97.6 F (36.4 C)   Resp 20   Ht 5' 4 (1.626 m)   Wt 113 lb 4 oz (51.4 kg)   SpO2 99%   BMI 19.44 kg/m  Wt Readings from Last 3 Encounters:  02/02/24 113 lb 4 oz (51.4 kg)  12/18/23 116 lb (52.6 kg)  12/02/23 114 lb 14.4 oz (52.1 kg)    Physical Exam Vitals reviewed.  Constitutional:      General: She is not in acute distress.    Appearance: Normal appearance.  HENT:     Head: Normocephalic and atraumatic.     Right Ear: External ear normal.     Left Ear: External ear normal.     Mouth/Throat:     Pharynx: No oropharyngeal exudate or posterior oropharyngeal erythema.  Eyes:     General: No scleral icterus.       Right eye: No discharge.        Left eye: No discharge.     Conjunctiva/sclera: Conjunctivae normal.  Neck:     Thyroid : No thyromegaly.  Cardiovascular:     Rate and Rhythm: Normal rate and regular rhythm.  Pulmonary:     Effort: No respiratory distress.     Breath sounds: Normal breath sounds. No wheezing.  Abdominal:     General: Bowel sounds are normal.     Palpations: Abdomen is soft.     Tenderness: There is no abdominal tenderness.  Musculoskeletal:        General: No swelling or tenderness.     Cervical back:  Neck supple. No tenderness.  Lymphadenopathy:     Cervical: No cervical adenopathy.  Skin:    Findings: No erythema or rash.  Neurological:     Mental Status: She is alert.  Psychiatric:        Mood and Affect: Mood normal.        Behavior: Behavior normal.         Outpatient Encounter Medications as of 02/02/2024  Medication Sig   amLODipine  (NORVASC ) 2.5 MG tablet Take 1 tablet (2.5 mg total) by mouth daily.   aspirin 81 MG chewable tablet Chew 81 mg by mouth daily.   blood glucose meter kit and supplies KIT Dispense based on patient and insurance preference. Use up to four times daily as directed.   Calcium -Vitamin D-Vitamin K (CALCIUM  + D + K PO) Take by mouth daily.   carboxymethylcellul-glycerin (REFRESH OPTIVE) 0.5-0.9 % ophthalmic solution Place 1 drop into both eyes.   Cholecalciferol (VITAMIN D3) 25 MCG (1000 UT) CHEW Chew 2,000 Units by mouth in the morning and at bedtime.   Coenzyme Q10 (CO Q 10 PO) Take 1 tablet by mouth daily at 6 (six) AM.   Cranberry-Vitamin C-Probiotic (AZO CRANBERRY PO) Take by mouth in the morning and at bedtime.   Cyanocobalamin (VITAMIN B12 PO) Take 2 tablets by mouth daily at 6 (six) AM. gummies   Difluprednate 0.05 % EMUL    eszopiclone  (LUNESTA ) 2 MG TABS tablet TAKE ONE TABLET BY MOUTH AT BEDTIME AS NEEDED FOR SLEEP   FREESTYLE LITE test strip USE TO CHECK BLOOD SUGARS THREE TIMES A DAY   levothyroxine  (SYNTHROID ) 50 MCG tablet Take 1 tablet (50 mcg total) by mouth daily.   losartan  (COZAAR ) 25 MG tablet Take 1 tablet (25 mg total) by mouth daily.   MAGNESIUM PO Take 1 tablet by mouth daily at 6 (six) AM.   metoprolol  succinate (TOPROL -XL) 25 MG 24 hr tablet Take  1 tablet (25 mg total) by mouth 2 (two) times daily.   Multiple Vitamin (MULTIVITAMIN) tablet Take 1 tablet by mouth daily.   MURO 128 5 % ophthalmic ointment APPLY ONE FOURTH (1/4) INCH INTO THE RIGHT EYE TWO TIMES A DAY   polycarbophil (FIBERCON) 625 MG tablet Take 625 mg by mouth  daily.   Probiotic Product (PROBIOTIC DAILY PO) Take 1 tablet by mouth daily at 6 (six) AM.   rosuvastatin  (CRESTOR ) 20 MG tablet Take 1 tablet (20 mg total) by mouth daily.   Semaglutide ,0.25 or 0.5MG /DOS, 2 MG/3ML SOPN Inject 0.25 mg into the skin once a week.   [DISCONTINUED] metFORMIN  (GLUCOPHAGE -XR) 500 MG 24 hr tablet Takes 1 tablet AM, 2 tablets PM   metFORMIN  (GLUCOPHAGE -XR) 500 MG 24 hr tablet Takes 2 tablets AM, 2 tablets PM   No facility-administered encounter medications on file as of 02/02/2024.     Lab Results  Component Value Date   WBC 5.3 03/27/2023   HGB 12.6 03/27/2023   HCT 37.8 03/27/2023   PLT 266.0 03/27/2023   GLUCOSE 119 (H) 01/27/2024   CHOL 139 01/27/2024   TRIG 72.0 01/27/2024   HDL 83.10 01/27/2024   LDLCALC 41 01/27/2024   ALT 15 01/27/2024   AST 27 01/27/2024   NA 133 (L) 01/27/2024   K 4.1 01/27/2024   CL 96 01/27/2024   CREATININE 0.89 01/27/2024   BUN 12 01/27/2024   CO2 29 01/27/2024   TSH 1.50 11/27/2023   HGBA1C 6.7 (H) 01/27/2024   MICROALBUR <0.7 10/26/2023    CT CARDIAC SCORING (SELF PAY ONLY) Addendum Date: 01/22/2024 ADDENDUM REPORT: 01/22/2024 20:48 EXAM: OVER-READ INTERPRETATION  CT CHEST The following report is an over-read performed by radiologist Dr. Andrea Gasman of [facility] on 01/22/2024. This over-read does not include interpretation of cardiac or coronary anatomy or pathology. The coronary calcium  score interpretation by the cardiologist is attached. COMPARISON:  None. FINDINGS: Vascular: Aortic atherosclerosis. The included aorta is normal in caliber. Mediastinum/nodes: No adenopathy or mass. Unremarkable esophagus. Lungs: No focal airspace disease. No pulmonary nodule. No pleural fluid. The included airways are patent. Upper abdomen: No acute or unexpected findings. Musculoskeletal: There are no acute or suspicious osseous abnormalities. IMPRESSION: Aortic Atherosclerosis (ICD10-I70.0). Electronically Signed   By:  Andrea Gasman M.D.   On: 01/22/2024 20:48   Result Date: 01/22/2024 CLINICAL DATA:  Risk stratification EXAM: Coronary Calcium  Score TECHNIQUE: The patient was scanned on a Siemens Somatom scanner. Axial non-contrast 3 mm slices were carried out through the heart. The data set was analyzed on a dedicated work station and scored using the Agatson method. FINDINGS: Non-cardiac: See separate report from [facility]. Ascending Aorta: Normal size Pericardium: Normal Coronary arteries: Normal origin of left and right coronary arteries. Distribution of arterial calcifications if present, as noted below; LM 0 LAD 19 LCx 0 RCA 0 Total 19 IMPRESSION AND RECOMMENDATION: 1. Coronary calcium  score of 19. This was 47th percentile for age and sex matched control. 2. CAC 1-99 in LAD. CAC-DRS A1/N1. 3. Continue heart healthy lifestyle and risk factor modification. Electronically Signed: By: Redell Cave M.D. On: 01/11/2024 10:09       Assessment & Plan:  Hyponatremia Assessment & Plan: Sodium slightly decreased.  Discussed increased po intake. Follow.  Will monitor fluid intake. Plan for recheck sodium as outlined.   Orders: -     Sodium; Future  Hypercholesterolemia Assessment & Plan: Continue crestor .  Follow lipid panel and liver function tests. No change  today.  Lab Results  Component Value Date   CHOL 139 01/27/2024   HDL 83.10 01/27/2024   LDLCALC 41 01/27/2024   TRIG 72.0 01/27/2024   CHOLHDL 2 01/27/2024      Hypertension, essential Assessment & Plan: On losartan  and amlodipine  2.5mg  q day. Follow pressures. No changes in medication today. Follow.    Hypothyroidism, unspecified type Assessment & Plan: On thyroid  replacement. Follow tsh.    Osteoporosis without current pathological fracture, unspecified osteoporosis type Assessment & Plan:  Saw endocrinology 09/23/23 - recommended reclast. Was put on hold due to dental procedures.    Raynaud's disease without  gangrene Assessment & Plan: On amlodipine  now for blood pressure. Stable.    Type 2 diabetes mellitus with hyperglycemia, without long-term current use of insulin (HCC) Assessment & Plan: Taking ozempic  .25mg . Taking metformin  1 in am and 2 in pm. Tolerating. Follow sugars. Follow met b and A1c. She is concerned that her sugars are elevated. Discussed medications. Hold on increasing ozempic . She wants to increase metformin . Will adjust dose. Follow sugars.    Stress Assessment & Plan: Increased stress. Discussed. Will notify me if feels needs further intervention. Follow.    Other orders -     metFORMIN  HCl ER; Takes 2 tablets AM, 2 tablets PM  Dispense: 360 tablet; Refill: 1     Allena Hamilton, MD

## 2024-02-03 ENCOUNTER — Encounter: Payer: Self-pay | Admitting: Internal Medicine

## 2024-02-07 ENCOUNTER — Encounter: Payer: Self-pay | Admitting: Internal Medicine

## 2024-02-07 NOTE — Assessment & Plan Note (Signed)
 Sodium slightly decreased.  Discussed increased po intake. Follow.  Will monitor fluid intake. Plan for recheck sodium as outlined.

## 2024-02-07 NOTE — Assessment & Plan Note (Signed)
 On thyroid replacement.  Follow tsh.

## 2024-02-07 NOTE — Assessment & Plan Note (Signed)
 On amlodipine  now for blood pressure. Stable.

## 2024-02-07 NOTE — Assessment & Plan Note (Signed)
Increased stress.  Discussed.  Will notify me if feels needs further intervention.  Follow.

## 2024-02-07 NOTE — Assessment & Plan Note (Signed)
 Saw endocrinology 09/23/23 - recommended reclast. Was put on hold due to dental procedures.

## 2024-02-07 NOTE — Assessment & Plan Note (Signed)
 On losartan  and amlodipine  2.5mg  q day. Follow pressures. No changes in medication today. Follow.

## 2024-02-07 NOTE — Assessment & Plan Note (Signed)
 Continue crestor .  Follow lipid panel and liver function tests. No change today.  Lab Results  Component Value Date   CHOL 139 01/27/2024   HDL 83.10 01/27/2024   LDLCALC 41 01/27/2024   TRIG 72.0 01/27/2024   CHOLHDL 2 01/27/2024

## 2024-02-07 NOTE — Assessment & Plan Note (Signed)
 Taking ozempic  .25mg . Taking metformin  1 in am and 2 in pm. Tolerating. Follow sugars. Follow met b and A1c. She is concerned that her sugars are elevated. Discussed medications. Hold on increasing ozempic . She wants to increase metformin . Will adjust dose. Follow sugars.

## 2024-02-08 DIAGNOSIS — H1811 Bullous keratopathy, right eye: Secondary | ICD-10-CM | POA: Diagnosis not present

## 2024-02-10 ENCOUNTER — Encounter: Payer: Self-pay | Admitting: Internal Medicine

## 2024-02-11 NOTE — Telephone Encounter (Signed)
 Please call her and notify her that the recommendation is to be off GLP 1s (for example ozempic ) - for one week prior to surgery. If she is wanting to hold now until after surgery, I am ok with this.  Would hold on vitamin c. Any questions, let us  know. If needs an appt prior to procedure can schedule as well.

## 2024-02-12 NOTE — Telephone Encounter (Signed)
 Patient is aware of below. I have scheduled her to be seen virtually on Monday to discuss per her request.

## 2024-02-14 ENCOUNTER — Other Ambulatory Visit: Payer: Self-pay | Admitting: Internal Medicine

## 2024-02-15 ENCOUNTER — Encounter: Payer: Self-pay | Admitting: Internal Medicine

## 2024-02-15 ENCOUNTER — Telehealth (INDEPENDENT_AMBULATORY_CARE_PROVIDER_SITE_OTHER): Admitting: Internal Medicine

## 2024-02-15 DIAGNOSIS — E1165 Type 2 diabetes mellitus with hyperglycemia: Secondary | ICD-10-CM | POA: Diagnosis not present

## 2024-02-15 DIAGNOSIS — I10 Essential (primary) hypertension: Secondary | ICD-10-CM

## 2024-02-15 DIAGNOSIS — F439 Reaction to severe stress, unspecified: Secondary | ICD-10-CM

## 2024-02-15 NOTE — Progress Notes (Signed)
 Patient ID: Donna Howell, female   DOB: 29-Oct-1950, 73 y.o.   MRN: [id number]   Virtual Visit via video Note  I connected with Donna Howell by a video enabled telemedicine application and verified that I am speaking with the correct person using two identifiers. Location patient: home Location provider: work Persons participating in the virtual visit: patient, provider  The limitations, risks, security and privacy concerns of performing an evaluation and management service by video  and the availability of in person appointments have bene discussed. It has also been discussed with the patient that there may be a patient responsible charge related to this service. The patient expressed understanding and agreed to proceed.   Reason for visit: work in appt.   HPI: Work in to discuss upcoming procedures. She is planning to have oral surgery 02/22/24. Four implants to be placed. No general anesthesia required. She is then planning to have eye surgery 03/17/24. Had questions about taking ozempic . Discussed the need to be off one week before procedure. She is concerned because she will not be able to eat. Her sugars have been well controlled. Discussed remaining off ozempic  during this period of time. She watches her diet well. No chest pain or sob reported. No abdominal pain or bowel change reported. Still with increased stress. Discussed.    ROS: See pertinent positives and negatives per HPI.  Past Medical History:  Diagnosis Date   Allergy 1974   Anemia    Arthritis    Chicken pox    Clotting disorder (HCC) 1962   Abnormal clotting time   Diabetes (HCC)    Family history of adverse reaction to anesthesia    daughter coded during surgery and son had trouble breathing during surgery   Frequent headaches    GERD (gastroesophageal reflux disease)    Grade I diastolic dysfunction    Hay fever    Heart murmur    High blood pressure    History of bronchitis    Hypothyroidism    IBS  (irritable bowel syndrome)    Liver cyst    Mitral valve regurgitation    Moderate mitral regurgitation by prior echocardiogram    Moderate tricuspid regurgitation by prior echocardiogram    Multilevel degenerative disc disease    Raynaud's disease    Substance abuse (HCC)    Tricuspid regurgitation    Wears dentures    full lower    Past Surgical History:  Procedure Laterality Date   APPENDECTOMY  1974   BREAST BIOPSY Left 01/16/2021   Affirm bx-Ribbon clip- PREDOMINANTLY BENIGN ADIPOSE TISSUE. - AREAS OF STROMAL FIBROSIS AND MAMMARY GLANDS WITH ATROPHIC AND APOCRINE CHANGES. - NEGATIVE FOR ATYPIA AND MALIGNANCY.   CATARACT EXTRACTION W/PHACO Left 09/02/2023   Procedure: CATARACT EXTRACTION PHACO AND INTRAOCULAR LENS PLACEMENT (IOC) LEFT DIABETIC MALYUGIN  CLAREON VIVITY TORIC 8.20 00:44.0;  Surgeon: Mittie Gaskin, MD;  Location: [facility];  Service: Ophthalmology;  Laterality: Left;   CATARACT EXTRACTION W/PHACO Right 09/28/2023   Procedure: CATARACT EXTRACTION PHACO AND INTRAOCULAR LENS PLACEMENT (IOC) RIGHT DIABETIC MALYUGIN  CLAREON VIVITY 6.17 00:41.9;  Surgeon: Mittie Gaskin, MD;  Location: [facility];  Service: Ophthalmology;  Laterality: Right;   CERVICAL CERCLAGE     DIAGNOSTIC LAPAROSCOPY     MULTIPLE TOOTH EXTRACTIONS  11/23/2023   REMOVE AND REPLACE LENS Right 12/02/2023   Procedure: REMOVAL OR REPLACEMENT, IOL 0.24 00:01.0;  Surgeon: Mittie Gaskin, MD;  Location: [facility];  Service: Ophthalmology;  Laterality: Right;  SHOULDER ARTHROSCOPY WITH SUBACROMIAL DECOMPRESSION, ROTATOR CUFF REPAIR AND BICEP TENDON REPAIR Right 12/24/2021   Procedure: RIGHT SHOULDER ARTHROSCOPY WITH DEBRIDEMENT, DECOMPRESSION, ROTATOR CUFF RELEASE, AND BICEPS TENODESIS;  Surgeon: Edie Norleen PARAS, MD;  Location: [facility];  Service: Orthopedics;  Laterality: Right;   TONSILLECTOMY AND ADENOIDECTOMY  1962    Family History  Problem Relation Age of  Onset   Arthritis Mother    Heart disease Mother    Diabetes Mother    Hypertension Mother    Stroke Father    Heart disease Father    Arthritis Sister    Breast cancer Sister 72   Alcoholism Brother    Alcohol abuse Brother        Died at 43   Arthritis Maternal Grandmother    Heart disease Maternal Grandmother    Diabetes Maternal Grandmother    Hypertension Maternal Grandmother    Heart disease Maternal Grandfather     SOCIAL HX: reviewed.    Current Outpatient Medications:    amLODipine  (NORVASC ) 2.5 MG tablet, TAKE ONE TABLET BY MOUTH ONE TIME DAILY, Disp: 90 tablet, Rfl: 1   aspirin 81 MG chewable tablet, Chew 81 mg by mouth daily., Disp: , Rfl:    blood glucose meter kit and supplies KIT, Dispense based on patient and insurance preference. Use up to four times daily as directed., Disp: 1 each, Rfl: 3   Calcium -Vitamin D-Vitamin K (CALCIUM  + D + K PO), Take by mouth daily., Disp: , Rfl:    carboxymethylcellul-glycerin (REFRESH OPTIVE) 0.5-0.9 % ophthalmic solution, Place 1 drop into both eyes., Disp: , Rfl:    Cholecalciferol (VITAMIN D3) 25 MCG (1000 UT) CHEW, Chew 2,000 Units by mouth in the morning and at bedtime., Disp: , Rfl:    Coenzyme Q10 (CO Q 10 PO), Take 1 tablet by mouth daily at 6 (six) AM., Disp: , Rfl:    Cranberry-Vitamin C-Probiotic (AZO CRANBERRY PO), Take by mouth in the morning and at bedtime., Disp: , Rfl:    Cyanocobalamin (VITAMIN B12 PO), Take 2 tablets by mouth daily at 6 (six) AM. gummies, Disp: , Rfl:    Difluprednate 0.05 % EMUL, , Disp: , Rfl:    eszopiclone  (LUNESTA ) 2 MG TABS tablet, TAKE ONE TABLET BY MOUTH AT BEDTIME AS NEEDED FOR SLEEP, Disp: 30 tablet, Rfl: 2   FREESTYLE LITE test strip, USE TO CHECK BLOOD SUGARS THREE TIMES A DAY, Disp: 100 strip, Rfl: 10   levothyroxine  (SYNTHROID ) 50 MCG tablet, Take 1 tablet (50 mcg total) by mouth daily., Disp: 90 tablet, Rfl: 3   losartan  (COZAAR ) 25 MG tablet, Take 1 tablet (25 mg total) by mouth  daily., Disp: 90 tablet, Rfl: 3   MAGNESIUM PO, Take 1 tablet by mouth daily at 6 (six) AM., Disp: , Rfl:    metFORMIN  (GLUCOPHAGE -XR) 500 MG 24 hr tablet, Takes 2 tablets AM, 2 tablets PM, Disp: 360 tablet, Rfl: 1   metoprolol  succinate (TOPROL -XL) 25 MG 24 hr tablet, Take 1 tablet (25 mg total) by mouth 2 (two) times daily., Disp: 180 tablet, Rfl: 3   Multiple Vitamin (MULTIVITAMIN) tablet, Take 1 tablet by mouth daily., Disp: , Rfl:    MURO 128 5 % ophthalmic ointment, APPLY ONE FOURTH (1/4) INCH INTO THE RIGHT EYE TWO TIMES A DAY, Disp: , Rfl:    polycarbophil (FIBERCON) 625 MG tablet, Take 625 mg by mouth daily., Disp: , Rfl:    Probiotic Product (PROBIOTIC DAILY PO), Take 1 tablet by mouth daily at  6 (six) AM., Disp: , Rfl:    rosuvastatin  (CRESTOR ) 20 MG tablet, Take 1 tablet (20 mg total) by mouth daily., Disp: 90 tablet, Rfl: 3   Semaglutide ,0.25 or 0.5MG /DOS, 2 MG/3ML SOPN, Inject 0.25 mg into the skin once a week., Disp: 6 mL, Rfl: 1  EXAM:  GENERAL: alert, oriented, appears well and in no acute distress  HEENT: atraumatic, conjunttiva clear, no obvious abnormalities on inspection of external nose and ears  NECK: normal movements of the head and neck  LUNGS: on inspection no signs of respiratory distress, breathing rate appears normal, no obvious gross SOB, gasping or wheezing  CV: no obvious cyanosis  PSYCH/NEURO: pleasant and cooperative, no obvious depression or anxiety, speech and thought processing grossly intact  ASSESSMENT AND PLAN:  Discussed the following assessment and plan:  Problem List Items Addressed This Visit     Type 2 diabetes mellitus with hyperglycemia (HCC)   Discussed her sugars, medication regimen and upcoming procedures. We discussed having to be off ozempic  prior to her procedures. Discussed recommendation just to remain off for now until after her eye surgery - to avoid low sugars, given her fasting time and inability to eat. Stay hydrated. Follow  sugars. Call with update/questions.       Stress   Increased stress. Increased stress with her health issues and with her family's issues. Discussed. Will notify me if feels needs further intervention. Follow.       Hypertension, essential - Primary   On losartan  and amlodipine  2.5mg  q day. Follow pressures. Blood pressure has been doing well.        Return for keep scheduled. .   I discussed the assessment and treatment plan with the patient. The patient was provided an opportunity to ask questions and all were answered. The patient agreed with the plan and demonstrated an understanding of the instructions.   The patient was advised to call back or seek an in-person evaluation if the symptoms worsen or if the condition fails to improve as anticipated.    Allena Hamilton, MD

## 2024-02-21 ENCOUNTER — Encounter: Payer: Self-pay | Admitting: Internal Medicine

## 2024-02-21 NOTE — Assessment & Plan Note (Signed)
 On losartan  and amlodipine  2.5mg  q day. Follow pressures. Blood pressure has been doing well.

## 2024-02-21 NOTE — Assessment & Plan Note (Signed)
 Discussed her sugars, medication regimen and upcoming procedures. We discussed having to be off ozempic  prior to her procedures. Discussed recommendation just to remain off for now until after her eye surgery - to avoid low sugars, given her fasting time and inability to eat. Stay hydrated. Follow sugars. Call with update/questions.

## 2024-02-21 NOTE — Assessment & Plan Note (Signed)
 Increased stress. Increased stress with her health issues and with her family's issues. Discussed. Will notify me if feels needs further intervention. Follow.

## 2024-03-01 ENCOUNTER — Encounter: Payer: Self-pay | Admitting: Internal Medicine

## 2024-03-01 ENCOUNTER — Other Ambulatory Visit (INDEPENDENT_AMBULATORY_CARE_PROVIDER_SITE_OTHER)

## 2024-03-01 ENCOUNTER — Ambulatory Visit: Payer: Self-pay | Admitting: Internal Medicine

## 2024-03-01 DIAGNOSIS — E871 Hypo-osmolality and hyponatremia: Secondary | ICD-10-CM

## 2024-03-01 LAB — SODIUM: Sodium: 131 meq/L — ABNORMAL LOW (ref 135–145)

## 2024-03-01 NOTE — Telephone Encounter (Signed)
 Please call and confirm she is doing ok. Need to try to increase oral food intake. Also, will need to limit fluids - (for example - free water by 1/3). We will need to follow sodium level.  Sodium on recent lab 131. Recommend above and recheck sodium in 10 days.

## 2024-03-02 NOTE — Telephone Encounter (Signed)
 Pt wanted to know if the Metformin  and Metoprolol  could make sodium low. She states that she has sodium has been low for 20 years or more.

## 2024-03-02 NOTE — Telephone Encounter (Signed)
 Order placed for sodium lab.  In reviewing, appt is already scheduled. Neither the metformin  nor the metoprolol  should be causing the low sodium.

## 2024-03-02 NOTE — Telephone Encounter (Signed)
 Called pt and she is healing from 1st surgery and now she is getting ready eye surgery. She stated that she will try to increase oral food intake. And decrease water intake.

## 2024-03-04 ENCOUNTER — Encounter: Payer: Self-pay | Admitting: Internal Medicine

## 2024-03-14 ENCOUNTER — Other Ambulatory Visit (INDEPENDENT_AMBULATORY_CARE_PROVIDER_SITE_OTHER)

## 2024-03-14 ENCOUNTER — Encounter: Payer: Self-pay | Admitting: Internal Medicine

## 2024-03-14 DIAGNOSIS — E871 Hypo-osmolality and hyponatremia: Secondary | ICD-10-CM | POA: Diagnosis not present

## 2024-03-14 LAB — SODIUM: Sodium: 135 meq/L (ref 135–145)

## 2024-03-15 ENCOUNTER — Ambulatory Visit: Payer: Self-pay | Admitting: Internal Medicine

## 2024-03-15 NOTE — Telephone Encounter (Signed)
 Please clarify with her the amount of fluids she was drinking prior to the decrease. Recommend decreasing by approximately 1/4 of amount. So if she was doing 80 ounces of fluid per day, recommend decrease to 60 ounces. Her recent sodium level was improved. If doing ok, can continue current regimen.

## 2024-03-17 DIAGNOSIS — Z888 Allergy status to other drugs, medicaments and biological substances status: Secondary | ICD-10-CM | POA: Diagnosis not present

## 2024-03-17 DIAGNOSIS — I071 Rheumatic tricuspid insufficiency: Secondary | ICD-10-CM | POA: Diagnosis not present

## 2024-03-17 DIAGNOSIS — Z880 Allergy status to penicillin: Secondary | ICD-10-CM | POA: Diagnosis not present

## 2024-03-17 DIAGNOSIS — E119 Type 2 diabetes mellitus without complications: Secondary | ICD-10-CM | POA: Diagnosis not present

## 2024-03-17 DIAGNOSIS — E7800 Pure hypercholesterolemia, unspecified: Secondary | ICD-10-CM | POA: Diagnosis not present

## 2024-03-17 DIAGNOSIS — E039 Hypothyroidism, unspecified: Secondary | ICD-10-CM | POA: Diagnosis not present

## 2024-03-17 DIAGNOSIS — I10 Essential (primary) hypertension: Secondary | ICD-10-CM | POA: Diagnosis not present

## 2024-03-17 DIAGNOSIS — K219 Gastro-esophageal reflux disease without esophagitis: Secondary | ICD-10-CM | POA: Diagnosis not present

## 2024-03-17 DIAGNOSIS — Z7984 Long term (current) use of oral hypoglycemic drugs: Secondary | ICD-10-CM | POA: Diagnosis not present

## 2024-03-17 DIAGNOSIS — Z91041 Radiographic dye allergy status: Secondary | ICD-10-CM | POA: Diagnosis not present

## 2024-03-17 DIAGNOSIS — Z885 Allergy status to narcotic agent status: Secondary | ICD-10-CM | POA: Diagnosis not present

## 2024-03-17 DIAGNOSIS — Z79899 Other long term (current) drug therapy: Secondary | ICD-10-CM | POA: Diagnosis not present

## 2024-03-17 DIAGNOSIS — Z7982 Long term (current) use of aspirin: Secondary | ICD-10-CM | POA: Diagnosis not present

## 2024-03-17 DIAGNOSIS — H18231 Secondary corneal edema, right eye: Secondary | ICD-10-CM | POA: Diagnosis not present

## 2024-03-17 DIAGNOSIS — Z79890 Hormone replacement therapy (postmenopausal): Secondary | ICD-10-CM | POA: Diagnosis not present

## 2024-03-17 DIAGNOSIS — M1990 Unspecified osteoarthritis, unspecified site: Secondary | ICD-10-CM | POA: Diagnosis not present

## 2024-03-24 DIAGNOSIS — Z961 Presence of intraocular lens: Secondary | ICD-10-CM | POA: Diagnosis not present

## 2024-03-24 DIAGNOSIS — Z9889 Other specified postprocedural states: Secondary | ICD-10-CM | POA: Diagnosis not present

## 2024-03-24 DIAGNOSIS — Z947 Corneal transplant status: Secondary | ICD-10-CM | POA: Diagnosis not present

## 2024-03-25 ENCOUNTER — Ambulatory Visit: Payer: Self-pay | Admitting: Internal Medicine

## 2024-03-25 NOTE — Telephone Encounter (Signed)
 FYI  Spoke with patient. She was prescribed a dose pak. Advised to take medications as prescribed- expect elevation while on steriods then sugars should trend back down. Patient gave verbal understanding.

## 2024-03-25 NOTE — Telephone Encounter (Signed)
 This RN made first attempt to contact pt with no answer. LVM with call back number provided.    Copied from CRM [id number]. Topic: Clinical - Medical Advice >> Mar 25, 2024 10:00 AM Robinson H wrote: Reason for CRM: Had eye surgery at [facility] on the 21st was given a steroid pack as of today along with the eyedrops still, having some swelling. haven't had it for over 15 years since it raised her blood pressure, has to take 6 take what can she do to protect herself from her sugar elevating again. Very concerned and hasn't taken the medication yet wants clarification from Dr. Glendia first.   Marval [phone]

## 2024-03-25 NOTE — Telephone Encounter (Signed)
 FYI Only or Action Required?: Action required by provider: wants to make sure she should take steroids.  Had eye surgery on Thursday.  States last time she took steroids her bgl elevated, states afraid to take it.   Patient was last seen in primary care on 02/15/2024 by Glendia Shad, MD.  Called Nurse Triage reporting Advice Only.  Symptoms began today.  Interventions attempted: Nothing.  Symptoms are: gradually worsening.  Triage Disposition: No disposition on file.  Patient/caregiver understands and will follow disposition?:  Reason for Disposition  [1] Caller requesting NON-URGENT health information AND [2] PCP's office is the best resource  Answer Assessment - Initial Assessment Questions 1. REASON FOR CALL: What is the main reason for your call? or How can I best help you?     wants to make sure she should take steroids.  Had eye surgery on Thursday.  States last time she took steroids her bgl elevated, states afraid to take it.  Protocols used: Information Only Call - No Triage-A-AH

## 2024-04-08 DIAGNOSIS — H1811 Bullous keratopathy, right eye: Secondary | ICD-10-CM | POA: Diagnosis not present

## 2024-04-11 DIAGNOSIS — Z947 Corneal transplant status: Secondary | ICD-10-CM | POA: Diagnosis not present

## 2024-04-21 DIAGNOSIS — Z947 Corneal transplant status: Secondary | ICD-10-CM | POA: Diagnosis not present

## 2024-04-21 DIAGNOSIS — Z7985 Long-term (current) use of injectable non-insulin antidiabetic drugs: Secondary | ICD-10-CM | POA: Diagnosis not present

## 2024-04-21 DIAGNOSIS — K219 Gastro-esophageal reflux disease without esophagitis: Secondary | ICD-10-CM | POA: Diagnosis not present

## 2024-04-21 DIAGNOSIS — I10 Essential (primary) hypertension: Secondary | ICD-10-CM | POA: Diagnosis not present

## 2024-04-21 DIAGNOSIS — E785 Hyperlipidemia, unspecified: Secondary | ICD-10-CM | POA: Diagnosis not present

## 2024-04-21 DIAGNOSIS — E119 Type 2 diabetes mellitus without complications: Secondary | ICD-10-CM | POA: Diagnosis not present

## 2024-04-21 DIAGNOSIS — E039 Hypothyroidism, unspecified: Secondary | ICD-10-CM | POA: Diagnosis not present

## 2024-04-21 DIAGNOSIS — Z79899 Other long term (current) drug therapy: Secondary | ICD-10-CM | POA: Diagnosis not present

## 2024-04-21 DIAGNOSIS — H1820 Unspecified corneal edema: Secondary | ICD-10-CM | POA: Diagnosis not present

## 2024-04-26 ENCOUNTER — Ambulatory Visit: Admitting: Medical

## 2024-04-27 NOTE — Progress Notes (Unsigned)
 Cardiology Office Note   Date:  04/29/2024  ID:  Donna Howell, DOB 01-Mar-1951, MRN [id number] PCP: Glendia Shad, MD  [facility] Providers Cardiologist:  Evalene Lunger, MD   History of Present Illness Donna Howell is a 73 y.o. female with a hx of DM2, moderate TR, HLD, HTN, mild LVH, diastolic dysfunction who is being seen for follow-up of valvular heart disease and Raynaud's.    Echo in May 2022 showed normal LVSF, normal RVSF, mild MR/TR   The patient was seen as a new patient 08/2021 by Dr. Gollan. She was a former Dr. Bosie patient. .    The patient was seen in December 2024 and was overall doing well with minimal shortness of breath.  Echocardiogram was ordered to evaluate murmur.  She was started on amlodipine  for Raynaud's disease.  Echo showed EF 60 to 65%, grade 1 diastolic dysfunction, mild to moderate MR, moderate TR.  The patient was last seen 09/2023 and was stable from a cardiac perspective.   Today, the patient reports she has been going through a lot physically and emotionally.  Since the last visit, she went through 8 eye surgeries due to complication on initial cataract surgery.  She is currently following at [facility]. They also had a death in the family and daughter needed heart procedure. On top of that, her husband has dementia. She had a calcium  score that was 19 ordered by her PCP.  She denies chest pain, shortness of breath, swelling on her feet, dizziness, lightheadedness.  She is having general balance problems due to eye issues.  Studies Reviewed EKG Interpretation Date/Time:  Friday April 29 2024 08:09:44 EDT Ventricular Rate:  59 PR Interval:  160 QRS Duration:  106 QT Interval:  448 QTC Calculation: 443 R Axis:   192  Text Interpretation: Sinus bradycardia Right superior axis deviation Low voltage QRS Incomplete right bundle branch block Septal infarct , age undetermined When compared with ECG of 29-Jun-2023 10:18, Septal infarct is now  Present Confirmed by Franchester, Dominica Kent ([id number]) on 04/29/2024 8:15:50 AM    Cardiac CT score 12/2023  IMPRESSION AND RECOMMENDATION: 1. Coronary calcium  score of 19. This was 47th percentile for age and sex matched control.   2. CAC 1-99 in LAD. CAC-DRS A1/N1.   3. Continue heart healthy lifestyle and risk factor modification.   Echo 07/2023 1. Left ventricular ejection fraction, by estimation, is 60 to 65%. The  left ventricle has normal function. The left ventricle has no regional  wall motion abnormalities. Left ventricular diastolic parameters are  consistent with Grade I diastolic  dysfunction (impaired relaxation). The average left ventricular global  longitudinal strain is -17.2 %.   2. Right ventricular systolic function is normal. The right ventricular  size is normal. There is normal pulmonary artery systolic pressure. The  estimated right ventricular systolic pressure is 19.3 mmHg.   3. The mitral valve is normal in structure. Mild to moderate mitral valve  regurgitation. No evidence of mitral stenosis.   4. Tricuspid valve regurgitation is moderate.   5. The aortic valve is tricuspid. Aortic valve regurgitation is not  visualized. No aortic stenosis is present.   6. The inferior vena cava is normal in size with greater than 50%  respiratory variability, suggesting right atrial pressure of 3 mmHg.    Echo 03/2018 Study Conclusions   - Left ventricle: The cavity size was normal. Systolic function was    normal. The estimated ejection fraction was in the range of 60%  to 65%. Wall motion was normal; there were no regional wall    motion abnormalities. Features are consistent with a pseudonormal    left ventricular filling pattern, with concomitant abnormal    relaxation and increased filling pressure (grade 2 diastolic    dysfunction).  - Mitral valve: There was mild regurgitation.  - Left atrium: The atrium was mildly dilated.  - Right ventricle: Systolic function was  normal.  - Tricuspid valve: There was moderate-severe regurgitation.  - Pulmonary arteries: Systolic pressure was minimally elevated PA    peak pressure: 32 mm Hg (S).         Physical Exam VS:  BP 110/62 (BP Location: Left Arm, Patient Position: Sitting)   Pulse (!) 59   Ht 5' 4 (1.626 m)   Wt 112 lb 12.8 oz (51.2 kg)   SpO2 99%   BMI 19.36 kg/m        Wt Readings from Last 3 Encounters:  04/29/24 112 lb 12.8 oz (51.2 kg)  02/02/24 113 lb 4 oz (51.4 kg)  12/18/23 116 lb (52.6 kg)    GEN: Well nourished, well developed in no acute distress NECK: No JVD; No carotid bruits CARDIAC: RRR, no murmurs, rubs, gallops RESPIRATORY:  Clear to auscultation without rales, wheezing or rhonchi  ABDOMEN: Soft, non-tender, non-distended EXTREMITIES:  No edema; No deformity   ASSESSMENT AND PLAN  Diastolic dysfunction Valvular disease Patient is euvolemic on exam.  Echo 07/2023 showed EF 60 to 65%, grade 1 diastolic dysfunction, mild to moderate MR, moderate TR.  Continue losartan  25 mg daily and Toprol  25 mg twice daily.  Can repeat echo at follow-up to evaluate valvular disease.  Coronary artery disease Recent coronary calcium  score of 19.  Patient denies any anginal symptoms.  Continue primary prevention with aspirin 81 mg daily and Crestor  20 mg daily.  HLD LDL 41.  Continue Crestor  20 mg daily.  PVCs Seen previously on EKGs.  She is asymptomatic.  Continue Toprol  25 mg twice daily.  Raynaud's disease Patient takes amlodipine  2.5 mg daily.  She may need increased dose over the winter, she will let us  know.  HTN Blood pressure 110/62.  Continue losartan  and amlodipine  and Toprol .        Dispo: Follow-up in 6 months  Signed, Takeya Marquis VEAR Fishman, PA-C

## 2024-04-29 ENCOUNTER — Encounter: Payer: Self-pay | Admitting: Medical

## 2024-04-29 ENCOUNTER — Ambulatory Visit: Attending: Medical | Admitting: Medical

## 2024-04-29 VITALS — BP 110/62 | HR 59 | Ht 64.0 in | Wt 112.8 lb

## 2024-04-29 DIAGNOSIS — I7301 Raynaud's syndrome with gangrene: Secondary | ICD-10-CM | POA: Diagnosis not present

## 2024-04-29 DIAGNOSIS — I493 Ventricular premature depolarization: Secondary | ICD-10-CM | POA: Insufficient documentation

## 2024-04-29 DIAGNOSIS — E782 Mixed hyperlipidemia: Secondary | ICD-10-CM | POA: Diagnosis not present

## 2024-04-29 DIAGNOSIS — I38 Endocarditis, valve unspecified: Secondary | ICD-10-CM | POA: Insufficient documentation

## 2024-04-29 DIAGNOSIS — I5189 Other ill-defined heart diseases: Secondary | ICD-10-CM | POA: Diagnosis not present

## 2024-04-29 DIAGNOSIS — I2510 Atherosclerotic heart disease of native coronary artery without angina pectoris: Secondary | ICD-10-CM | POA: Insufficient documentation

## 2024-04-29 DIAGNOSIS — I10 Essential (primary) hypertension: Secondary | ICD-10-CM | POA: Diagnosis not present

## 2024-04-29 NOTE — Patient Instructions (Signed)
 Medication Instructions:  Your physician recommends that you continue on your current medications as directed. Please refer to the Current Medication list given to you today.    *If you need a refill on your cardiac medications before your next appointment, please call your pharmacy*  Lab Work: No labs ordered today    Testing/Procedures: No test ordered today   Follow-Up: At [facility], you and your health needs are our priority.  As part of our continuing mission to provide you with exceptional heart care, our providers are all part of one team.  This team includes your primary Cardiologist (physician) and Advanced Practice Providers or APPs (Physician Assistants and Nurse Practitioners) who all work together to provide you with the care you need, when you need it.  Your next appointment:   6 month(s)  Provider:   Timothy Gollan, MD or Cadence Franchester, PA-C

## 2024-05-03 ENCOUNTER — Other Ambulatory Visit: Payer: Self-pay

## 2024-05-03 ENCOUNTER — Other Ambulatory Visit (INDEPENDENT_AMBULATORY_CARE_PROVIDER_SITE_OTHER)

## 2024-05-03 DIAGNOSIS — E1165 Type 2 diabetes mellitus with hyperglycemia: Secondary | ICD-10-CM

## 2024-05-03 DIAGNOSIS — I10 Essential (primary) hypertension: Secondary | ICD-10-CM

## 2024-05-03 DIAGNOSIS — E7800 Pure hypercholesterolemia, unspecified: Secondary | ICD-10-CM

## 2024-05-03 LAB — CBC WITH DIFFERENTIAL/PLATELET
Basophils Absolute: 0 K/uL (ref 0.0–0.1)
Basophils Relative: 0.8 % (ref 0.0–3.0)
Eosinophils Absolute: 0.1 K/uL (ref 0.0–0.7)
Eosinophils Relative: 2.1 % (ref 0.0–5.0)
HCT: 36.7 % (ref 36.0–46.0)
Hemoglobin: 12.5 g/dL (ref 12.0–15.0)
Lymphocytes Relative: 31 % (ref 12.0–46.0)
Lymphs Abs: 1.6 K/uL (ref 0.7–4.0)
MCHC: 34.2 g/dL (ref 30.0–36.0)
MCV: 89.5 fl (ref 78.0–100.0)
Monocytes Absolute: 0.6 K/uL (ref 0.1–1.0)
Monocytes Relative: 11.9 % (ref 3.0–12.0)
Neutro Abs: 2.9 K/uL (ref 1.4–7.7)
Neutrophils Relative %: 54.2 % (ref 43.0–77.0)
Platelets: 288 K/uL (ref 150.0–400.0)
RBC: 4.09 Mil/uL (ref 3.87–5.11)
RDW: 13.3 % (ref 11.5–15.5)
WBC: 5.3 K/uL (ref 4.0–10.5)

## 2024-05-03 LAB — HEPATIC FUNCTION PANEL
ALT: 16 U/L (ref 0–35)
AST: 24 U/L (ref 0–37)
Albumin: 4.6 g/dL (ref 3.5–5.2)
Alkaline Phosphatase: 40 U/L (ref 39–117)
Bilirubin, Direct: 0.1 mg/dL (ref 0.0–0.3)
Total Bilirubin: 0.6 mg/dL (ref 0.2–1.2)
Total Protein: 6.8 g/dL (ref 6.0–8.3)

## 2024-05-03 LAB — LIPID PANEL
Cholesterol: 128 mg/dL (ref 0–200)
HDL: 79 mg/dL (ref >39.00)
LDL Cholesterol: 36 mg/dL (ref 0–99)
NonHDL: 49.12
Total CHOL/HDL Ratio: 2
Triglycerides: 64 mg/dL (ref 0.0–149.0)
VLDL: 12.8 mg/dL (ref 0.0–40.0)

## 2024-05-03 LAB — BASIC METABOLIC PANEL WITH GFR
BUN: 10 mg/dL (ref 6–23)
CO2: 31 meq/L (ref 19–32)
Calcium: 9.7 mg/dL (ref 8.4–10.5)
Chloride: 97 meq/L (ref 96–112)
Creatinine, Ser: 0.75 mg/dL (ref 0.40–1.20)
GFR: 79.32 mL/min (ref >60.00)
Glucose, Bld: 104 mg/dL — ABNORMAL HIGH (ref 70–99)
Potassium: 4.4 meq/L (ref 3.5–5.1)
Sodium: 134 meq/L — ABNORMAL LOW (ref 135–145)

## 2024-05-03 LAB — HEMOGLOBIN A1C: Hgb A1c MFr Bld: 6.6 % — ABNORMAL HIGH (ref 4.6–6.5)

## 2024-05-04 ENCOUNTER — Telehealth: Payer: Self-pay | Admitting: Medical

## 2024-05-04 ENCOUNTER — Ambulatory Visit: Payer: Self-pay | Admitting: Internal Medicine

## 2024-05-04 NOTE — Telephone Encounter (Signed)
 Pt visited with [facility] and provider advised our office read their report and compare with our offices report of her EKG. EKG was irregular and showed she had a previous heart attack. [facility] suggesting pt be seen urgently. Please advise.

## 2024-05-04 NOTE — Telephone Encounter (Signed)
 Called patient to advise that she could have appointment just to have another EKG done - last EKG in office on 10/3 sinus brady - patient declined at this time

## 2024-05-05 ENCOUNTER — Other Ambulatory Visit

## 2024-05-10 ENCOUNTER — Ambulatory Visit: Admitting: Internal Medicine

## 2024-05-10 ENCOUNTER — Telehealth: Payer: Self-pay

## 2024-05-10 ENCOUNTER — Other Ambulatory Visit (HOSPITAL_COMMUNITY): Payer: Self-pay

## 2024-05-10 VITALS — BP 110/70 | HR 60 | Resp 16 | Ht 64.0 in | Wt 112.6 lb

## 2024-05-10 DIAGNOSIS — Z23 Encounter for immunization: Secondary | ICD-10-CM

## 2024-05-10 DIAGNOSIS — I7300 Raynaud's syndrome without gangrene: Secondary | ICD-10-CM | POA: Diagnosis not present

## 2024-05-10 DIAGNOSIS — I5189 Other ill-defined heart diseases: Secondary | ICD-10-CM | POA: Diagnosis not present

## 2024-05-10 DIAGNOSIS — E1165 Type 2 diabetes mellitus with hyperglycemia: Secondary | ICD-10-CM

## 2024-05-10 DIAGNOSIS — E039 Hypothyroidism, unspecified: Secondary | ICD-10-CM | POA: Diagnosis not present

## 2024-05-10 DIAGNOSIS — F439 Reaction to severe stress, unspecified: Secondary | ICD-10-CM

## 2024-05-10 DIAGNOSIS — I2510 Atherosclerotic heart disease of native coronary artery without angina pectoris: Secondary | ICD-10-CM | POA: Diagnosis not present

## 2024-05-10 DIAGNOSIS — E7800 Pure hypercholesterolemia, unspecified: Secondary | ICD-10-CM

## 2024-05-10 DIAGNOSIS — M810 Age-related osteoporosis without current pathological fracture: Secondary | ICD-10-CM | POA: Diagnosis not present

## 2024-05-10 DIAGNOSIS — I10 Essential (primary) hypertension: Secondary | ICD-10-CM | POA: Diagnosis not present

## 2024-05-10 MED ORDER — FREESTYLE LIBRE 3 PLUS SENSOR MISC: 11 refills | Status: AC

## 2024-05-10 NOTE — Telephone Encounter (Signed)
 Pharmacy Patient Advocate Encounter   Received notification from CoverMyMeds that prior authorization for FreeStyle Libre 3 Plus Sensor is required/requested.   Insurance verification completed.   The patient is insured through [employer].   Per test claim: PA required; PA submitted to above mentioned insurance via Latent Key/confirmation #/EOC [id number] Status is pending

## 2024-05-10 NOTE — Progress Notes (Signed)
 Subjective:    Patient ID: Donna Howell, female    DOB: [birth year], 73 y.o.   MRN: [id number]  Patient here for  Chief Complaint  Patient presents with   Medical Management of Chronic Issues    HPI Here for a scheduled follow up - follow up regarding diabetes, hypertension and hypercholesterolemia. Also increased stress. Recently had oral surgery 02/22/24 and multiple eye surgeries. Had f/u with cardiology 04/29/24. ECHO 07/2023 - EF 60-65%, G!DD, mild to moderate MR, moderate TR. Continue losartan  and toprol . Recommended f/u echo next visit. Also recommended to continue crestor  and aspirin. Had questions about her EKG performed through cardiology. Concerned regarding the computer's interpretation (specifically, she had read where the computer states septal infarct - age undetermined). Discussed above with her and discussed cardiology felt stable. No chest pain or sob reported. Previously very active. Not able to be as active now - due to recent procedures. Breathing stable. No abdominal pain or bowel change reported. Appetite is off. She is trying to eat. She request to add SGLT2 or increase her ozempic . Discussed holding on additional medication or increasing ozempic  - given decreased appetite, etc. A1c 6.5.      Past Medical History:  Diagnosis Date   Allergy 1974   Anemia    Arthritis    Chicken pox    Clotting disorder 1962   Abnormal clotting time   Diabetes (HCC)    Family history of adverse reaction to anesthesia    daughter coded during surgery and son had trouble breathing during surgery   Frequent headaches    GERD (gastroesophageal reflux disease)    Grade I diastolic dysfunction    Hay fever    Heart murmur    High blood pressure    History of bronchitis    Hypothyroidism    IBS (irritable bowel syndrome)    Liver cyst    Mitral valve regurgitation    Moderate mitral regurgitation by prior echocardiogram    Moderate tricuspid regurgitation by prior  echocardiogram    Multilevel degenerative disc disease    Raynaud's disease    Substance abuse (HCC)    Tricuspid regurgitation    Wears dentures    full lower   Past Surgical History:  Procedure Laterality Date   APPENDECTOMY  1974   BREAST BIOPSY Left 01/16/2021   Affirm bx-Ribbon clip- PREDOMINANTLY BENIGN ADIPOSE TISSUE. - AREAS OF STROMAL FIBROSIS AND MAMMARY GLANDS WITH ATROPHIC AND APOCRINE CHANGES. - NEGATIVE FOR ATYPIA AND MALIGNANCY.   CATARACT EXTRACTION W/PHACO Left 09/02/2023   Procedure: CATARACT EXTRACTION PHACO AND INTRAOCULAR LENS PLACEMENT (IOC) LEFT DIABETIC MALYUGIN  CLAREON VIVITY TORIC 8.20 00:44.0;  Surgeon: Mittie Gaskin, MD;  Location: [facility];  Service: Ophthalmology;  Laterality: Left;   CATARACT EXTRACTION W/PHACO Right 09/28/2023   Procedure: CATARACT EXTRACTION PHACO AND INTRAOCULAR LENS PLACEMENT (IOC) RIGHT DIABETIC MALYUGIN  CLAREON VIVITY 6.17 00:41.9;  Surgeon: Mittie Gaskin, MD;  Location: [facility];  Service: Ophthalmology;  Laterality: Right;   CERVICAL CERCLAGE     DIAGNOSTIC LAPAROSCOPY     MULTIPLE TOOTH EXTRACTIONS  11/23/2023   REMOVE AND REPLACE LENS Right 12/02/2023   Procedure: REMOVAL OR REPLACEMENT, IOL 0.24 00:01.0;  Surgeon: Mittie Gaskin, MD;  Location: [facility];  Service: Ophthalmology;  Laterality: Right;   SHOULDER ARTHROSCOPY WITH SUBACROMIAL DECOMPRESSION, ROTATOR CUFF REPAIR AND BICEP TENDON REPAIR Right 12/24/2021   Procedure: RIGHT SHOULDER ARTHROSCOPY WITH DEBRIDEMENT, DECOMPRESSION, ROTATOR CUFF RELEASE, AND BICEPS TENODESIS;  Surgeon: Edie Norleen PARAS,  MD;  Location: [facility];  Service: Orthopedics;  Laterality: Right;   TONSILLECTOMY AND ADENOIDECTOMY  1962   Family History  Problem Relation Age of Onset   Arthritis Mother    Heart disease Mother    Diabetes Mother    Hypertension Mother    Stroke Father    Heart disease Father    Arthritis Sister    Breast cancer  Sister 34   Alcoholism Brother    Alcohol abuse Brother        Died at 79   Arthritis Maternal Grandmother    Heart disease Maternal Grandmother    Diabetes Maternal Grandmother    Hypertension Maternal Grandmother    Heart disease Maternal Grandfather    Social History   Socioeconomic History   Marital status: Married    Spouse name: Not on file   Number of children: Not on file   Years of education: Not on file   Highest education level: Not on file  Occupational History   Not on file  Tobacco Use   Smoking status: Never   Smokeless tobacco: Never  Vaping Use   Vaping status: Never Used  Substance and Sexual Activity   Alcohol use: No   Drug use: No   Sexual activity: Not on file  Other Topics Concern   Not on file  Social History Narrative   Not on file   Social Drivers of Health   Financial Resource Strain: Low Risk  (09/20/2023)   Received from [facility]   Overall Financial Resource Strain (CARDIA)    Difficulty of Paying Living Expenses: Not very hard  Food Insecurity: No Food Insecurity (09/20/2023)   Received from [facility]   Hunger Vital Sign    Within the past 12 months, you worried that your food would run out before you got the money to buy more.: Never true    Within the past 12 months, the food you bought just didn't last and you didn't have money to get more.: Never true  Transportation Needs: No Transportation Needs (09/20/2023)   Received from [facility] - [facility]    In the past 12 months, has lack of transportation kept you from medical appointments or from getting medications?: No    Lack of Transportation (Non-Medical): No  Physical Activity: Sufficiently Active (03/10/2023)   Exercise Vital Sign    Days of Exercise per Week: 5 days    Minutes of Exercise per Session: 30 min  Stress: Stress Concern Present (03/10/2023)   [employer] [facility] [employer] -  Occupational Stress Questionnaire    Feeling of Stress : To some extent  Social Connections: Moderately Integrated (03/10/2023)   Social Connection and Isolation Panel    Frequency of Communication with Friends and Family: More than three times a week    Frequency of Social Gatherings with Friends and Family: Twice a week    Attends Religious Services: Never    Database administrator or Organizations: Yes    Attends Engineer, structural: More than 4 times per year    Marital Status: Married     Review of Systems  Constitutional:  Positive for appetite change. Negative for unexpected weight change.  HENT:  Negative for congestion and sinus pressure.   Respiratory:  Negative for cough, chest tightness and shortness of breath.   Cardiovascular:  Negative for chest pain, palpitations and leg swelling.  Gastrointestinal:  Negative for abdominal pain,  diarrhea, nausea and vomiting.  Genitourinary:  Negative for difficulty urinating and dysuria.  Musculoskeletal:  Negative for joint swelling and myalgias.  Skin:  Negative for color change and rash.  Neurological:  Negative for dizziness and headaches.  Psychiatric/Behavioral:  Negative for agitation and dysphoric mood.        Increased stress.        Objective:     BP 110/70   Pulse 60   Resp 16   Ht 5' 4 (1.626 m)   Wt 112 lb 9.6 oz (51.1 kg)   SpO2 98%   BMI 19.33 kg/m  Wt Readings from Last 3 Encounters:  05/10/24 112 lb 9.6 oz (51.1 kg)  04/29/24 112 lb 12.8 oz (51.2 kg)  02/02/24 113 lb 4 oz (51.4 kg)    Physical Exam Vitals reviewed.  Constitutional:      General: She is not in acute distress.    Appearance: Normal appearance.  HENT:     Head: Normocephalic and atraumatic.     Right Ear: External ear normal.     Left Ear: External ear normal.     Mouth/Throat:     Pharynx: No oropharyngeal exudate or posterior oropharyngeal erythema.  Eyes:     General: No scleral icterus.       Right eye: No  discharge.        Left eye: No discharge.     Conjunctiva/sclera: Conjunctivae normal.  Neck:     Thyroid : No thyromegaly.  Cardiovascular:     Rate and Rhythm: Normal rate and regular rhythm.  Pulmonary:     Effort: No respiratory distress.     Breath sounds: Normal breath sounds. No wheezing.  Abdominal:     General: Bowel sounds are normal.     Palpations: Abdomen is soft.     Tenderness: There is no abdominal tenderness.  Musculoskeletal:        General: No swelling or tenderness.     Cervical back: Neck supple. No tenderness.  Lymphadenopathy:     Cervical: No cervical adenopathy.  Skin:    Findings: No erythema or rash.  Neurological:     Mental Status: She is alert.  Psychiatric:        Mood and Affect: Mood normal.        Behavior: Behavior normal.         Outpatient Encounter Medications as of 05/10/2024  Medication Sig   Continuous Glucose Sensor (FREESTYLE LIBRE 3 PLUS SENSOR) MISC Change sensor every 15 days.   amLODipine  (NORVASC ) 2.5 MG tablet TAKE ONE TABLET BY MOUTH ONE TIME DAILY   aspirin 81 MG chewable tablet Chew 81 mg by mouth daily.   blood glucose meter kit and supplies KIT Dispense based on patient and insurance preference. Use up to four times daily as directed.   Calcium -Vitamin D-Vitamin K (CALCIUM  + D + K PO) Take by mouth daily.   carboxymethylcellul-glycerin (REFRESH OPTIVE) 0.5-0.9 % ophthalmic solution Place 1 drop into both eyes.   Cholecalciferol (VITAMIN D3) 25 MCG (1000 UT) CHEW Chew 2,000 Units by mouth in the morning and at bedtime.   Coenzyme Q10 (CO Q 10 PO) Take 1 tablet by mouth daily at 6 (six) AM.   Cranberry-Vitamin C-Probiotic (AZO CRANBERRY PO) Take by mouth in the morning and at bedtime.   Cyanocobalamin (VITAMIN B12 PO) Take 2 tablets by mouth daily at 6 (six) AM. gummies   Difluprednate 0.05 % EMUL  (Patient not taking: Reported on 04/29/2024)  eszopiclone  (LUNESTA ) 2 MG TABS tablet TAKE ONE TABLET BY MOUTH AT BEDTIME AS  NEEDED FOR SLEEP   FREESTYLE LITE test strip USE TO CHECK BLOOD SUGARS THREE TIMES A DAY   levothyroxine  (SYNTHROID ) 50 MCG tablet Take 1 tablet (50 mcg total) by mouth daily.   losartan  (COZAAR ) 25 MG tablet Take 1 tablet (25 mg total) by mouth daily.   MAGNESIUM PO Take 1 tablet by mouth daily at 6 (six) AM.   metFORMIN  (GLUCOPHAGE -XR) 500 MG 24 hr tablet Takes 2 tablets AM, 2 tablets PM   metoprolol  succinate (TOPROL -XL) 25 MG 24 hr tablet Take 1 tablet (25 mg total) by mouth 2 (two) times daily.   Multiple Vitamin (MULTIVITAMIN) tablet Take 1 tablet by mouth daily.   MURO 128 5 % ophthalmic ointment APPLY ONE FOURTH (1/4) INCH INTO THE RIGHT EYE TWO TIMES A DAY   ofloxacin (OCUFLOX) 0.3 % ophthalmic solution    polycarbophil (FIBERCON) 625 MG tablet Take 625 mg by mouth daily.   Probiotic Product (PROBIOTIC DAILY PO) Take 1 tablet by mouth daily at 6 (six) AM.   rosuvastatin  (CRESTOR ) 20 MG tablet Take 1 tablet (20 mg total) by mouth daily.   Semaglutide ,0.25 or 0.5MG /DOS, 2 MG/3ML SOPN Inject 0.25 mg into the skin once a week.   No facility-administered encounter medications on file as of 05/10/2024.     Lab Results  Component Value Date   WBC 5.3 05/03/2024   HGB 12.5 05/03/2024   HCT 36.7 05/03/2024   PLT 288.0 05/03/2024   GLUCOSE 104 (H) 05/03/2024   CHOL 128 05/03/2024   TRIG 64.0 05/03/2024   HDL 79.00 05/03/2024   LDLCALC 36 05/03/2024   ALT 16 05/03/2024   AST 24 05/03/2024   NA 134 (L) 05/03/2024   K 4.4 05/03/2024   CL 97 05/03/2024   CREATININE 0.75 05/03/2024   BUN 10 05/03/2024   CO2 31 05/03/2024   TSH 1.50 11/27/2023   HGBA1C 6.6 (H) 05/03/2024   MICROALBUR <0.7 10/26/2023    CT CARDIAC SCORING (SELF PAY ONLY) Addendum Date: 01/22/2024 ADDENDUM REPORT: 01/22/2024 20:48 EXAM: OVER-READ INTERPRETATION  CT CHEST The following report is an over-read performed by radiologist Dr. Andrea Gasman of [facility] on 01/22/2024. This over-read does not  include interpretation of cardiac or coronary anatomy or pathology. The coronary calcium  score interpretation by the cardiologist is attached. COMPARISON:  None. FINDINGS: Vascular: Aortic atherosclerosis. The included aorta is normal in caliber. Mediastinum/nodes: No adenopathy or mass. Unremarkable esophagus. Lungs: No focal airspace disease. No pulmonary nodule. No pleural fluid. The included airways are patent. Upper abdomen: No acute or unexpected findings. Musculoskeletal: There are no acute or suspicious osseous abnormalities. IMPRESSION: Aortic Atherosclerosis (ICD10-I70.0). Electronically Signed   By: Andrea Gasman M.D.   On: 01/22/2024 20:48   Result Date: 01/22/2024 CLINICAL DATA:  Risk stratification EXAM: Coronary Calcium  Score TECHNIQUE: The patient was scanned on a Siemens Somatom scanner. Axial non-contrast 3 mm slices were carried out through the heart. The data set was analyzed on a dedicated work station and scored using the Agatson method. FINDINGS: Non-cardiac: See separate report from [facility]. Ascending Aorta: Normal size Pericardium: Normal Coronary arteries: Normal origin of left and right coronary arteries. Distribution of arterial calcifications if present, as noted below; LM 0 LAD 19 LCx 0 RCA 0 Total 19 IMPRESSION AND RECOMMENDATION: 1. Coronary calcium  score of 19. This was 47th percentile for age and sex matched control. 2. CAC 1-99 in LAD. CAC-DRS  A1/N1. 3. Continue heart healthy lifestyle and risk factor modification. Electronically Signed: By: Redell Cave M.D. On: 01/11/2024 10:09       Assessment & Plan:  Flu vaccine need -     Flu vaccine HIGH DOSE PF(Fluzone Trivalent)  Type 2 diabetes mellitus with hyperglycemia, without long-term current use of insulin (HCC) Assessment & Plan: Discussed her sugars, medication regimen and recent procedures and upcoming procedure.  A1c 6.5. discussed importance of eating regular meals. She requested to add SG:LT2  inhibitor or increased GLP 1. Discussed holding given her current control of her sugars. Also, hold given her problems with diet. Follow sugars. Follow metabolic panel.     Stress Assessment & Plan: Increased stress. Increased stress with her health issues and with her family's issues. Discussed. Hold on further intervention. Follow.    Hypercholesterolemia Assessment & Plan: Continue crestor .  Follow lipid panel and liver function tests. No change today.  Lab Results  Component Value Date   CHOL 128 05/03/2024   HDL 79.00 05/03/2024   LDLCALC 36 05/03/2024   TRIG 64.0 05/03/2024   CHOLHDL 2 05/03/2024      Hypertension, essential Assessment & Plan: On losartan  and amlodipine  2.5mg  q day. Follow pressures. Follow metabolic panel.    Hypothyroidism, unspecified type Assessment & Plan: On thyroid  replacement. Follow tsh.    Osteoporosis without current pathological fracture, unspecified osteoporosis type Assessment & Plan:  Saw endocrinology 09/23/23 - recommended reclast. Was put on hold due to dental procedures.    Raynaud's disease without gangrene Assessment & Plan: Continue amlodipine . Stable.    Diastolic dysfunction Assessment & Plan: Had f/u with cardiology 04/29/24. ECHO 07/2023 - EF 60-65%, G!DD, mild to moderate MR, moderate TR. Continue losartan  and toprol . Recommended f/u echo next visit. Also recommended to continue crestor  and aspirin.   Orders: -     Ambulatory referral to Cardiology  Coronary artery disease involving native coronary artery of native heart without angina pectoris Assessment & Plan: Had f/u with cardiology 04/29/24. ECHO 07/2023 - EF 60-65%, G!DD, mild to moderate MR, moderate TR. Continue losartan  and toprol . Recommended f/u echo next visit. Also recommended to continue crestor  and aspirin. Had questions about her EKG performed through cardiology. Concerned regarding the computer's interpretation (specifically, she had read where the  computer states septal infarct - age undetermined). Discussed above with her and discussed cardiology felt stable. No chest pain or sob reported. She request referral to [facility] cardiology for further cardiac evaluation.   Orders: -     Ambulatory referral to Cardiology  Other orders -     FreeStyle Libre 3 Plus Sensor; Change sensor every 15 days.  Dispense: 2 each; Refill: 11     Allena Hamilton, MD

## 2024-05-11 NOTE — Telephone Encounter (Signed)
 Pharmacy Patient Advocate Encounter  Received notification from [employer] that Prior Authorization for FreeStyle Libre 3 Plus Senso  has been DENIED.  Full denial letter will be uploaded to the media tab. See denial reason below.    PA #/Case ID/Reference #: [id number]

## 2024-05-12 NOTE — Telephone Encounter (Signed)
 Last read by Barnie Mindi Perry at 11:15AM on 05/12/2024.

## 2024-05-16 ENCOUNTER — Encounter: Payer: Self-pay | Admitting: Internal Medicine

## 2024-05-16 DIAGNOSIS — I5189 Other ill-defined heart diseases: Secondary | ICD-10-CM | POA: Insufficient documentation

## 2024-05-16 DIAGNOSIS — I2510 Atherosclerotic heart disease of native coronary artery without angina pectoris: Secondary | ICD-10-CM | POA: Insufficient documentation

## 2024-05-16 NOTE — Assessment & Plan Note (Signed)
 Saw endocrinology 09/23/23 - recommended reclast. Was put on hold due to dental procedures.

## 2024-05-16 NOTE — Assessment & Plan Note (Signed)
 Had f/u with cardiology 04/29/24. ECHO 07/2023 - EF 60-65%, G!DD, mild to moderate MR, moderate TR. Continue losartan  and toprol . Recommended f/u echo next visit. Also recommended to continue crestor  and aspirin.

## 2024-05-16 NOTE — Assessment & Plan Note (Signed)
 Increased stress. Increased stress with her health issues and with her family's issues. Discussed. Hold on further intervention. Follow.

## 2024-05-16 NOTE — Assessment & Plan Note (Signed)
 Discussed her sugars, medication regimen and recent procedures and upcoming procedure.  A1c 6.5. discussed importance of eating regular meals. She requested to add SG:LT2 inhibitor or increased GLP 1. Discussed holding given her current control of her sugars. Also, hold given her problems with diet. Follow sugars. Follow metabolic panel.

## 2024-05-16 NOTE — Assessment & Plan Note (Signed)
 Continue crestor .  Follow lipid panel and liver function tests. No change today.  Lab Results  Component Value Date   CHOL 128 05/03/2024   HDL 79.00 05/03/2024   LDLCALC 36 05/03/2024   TRIG 64.0 05/03/2024   CHOLHDL 2 05/03/2024

## 2024-05-16 NOTE — Assessment & Plan Note (Signed)
 On thyroid replacement.  Follow tsh.

## 2024-05-16 NOTE — Assessment & Plan Note (Signed)
 On losartan  and amlodipine  2.5mg  q day. Follow pressures. Follow metabolic panel.

## 2024-05-16 NOTE — Assessment & Plan Note (Signed)
 Had f/u with cardiology 04/29/24. ECHO 07/2023 - EF 60-65%, G!DD, mild to moderate MR, moderate TR. Continue losartan  and toprol . Recommended f/u echo next visit. Also recommended to continue crestor  and aspirin. Had questions about her EKG performed through cardiology. Concerned regarding the computer's interpretation (specifically, she had read where the computer states septal infarct - age undetermined). Discussed above with her and discussed cardiology felt stable. No chest pain or sob reported. She request referral to [facility] cardiology for further cardiac evaluation.

## 2024-05-16 NOTE — Assessment & Plan Note (Signed)
Continue amlodipine.  Stable.   

## 2024-05-17 ENCOUNTER — Encounter: Payer: Self-pay | Admitting: Internal Medicine

## 2024-05-17 ENCOUNTER — Other Ambulatory Visit: Payer: Self-pay | Admitting: Internal Medicine

## 2024-05-18 NOTE — Telephone Encounter (Signed)
 Rx ok'd for lunesta .

## 2024-06-15 DIAGNOSIS — E7800 Pure hypercholesterolemia, unspecified: Secondary | ICD-10-CM | POA: Diagnosis not present

## 2024-06-15 DIAGNOSIS — I071 Rheumatic tricuspid insufficiency: Secondary | ICD-10-CM | POA: Diagnosis not present

## 2024-06-15 DIAGNOSIS — I10 Essential (primary) hypertension: Secondary | ICD-10-CM | POA: Diagnosis not present

## 2024-07-11 ENCOUNTER — Ambulatory Visit: Admitting: Internal Medicine

## 2024-07-13 ENCOUNTER — Encounter: Payer: Self-pay | Admitting: Internal Medicine

## 2024-07-13 ENCOUNTER — Ambulatory Visit: Admitting: Internal Medicine

## 2024-07-13 VITALS — BP 100/60 | HR 61 | Temp 97.4°F | Ht 64.0 in | Wt 112.4 lb

## 2024-07-13 DIAGNOSIS — I10 Essential (primary) hypertension: Secondary | ICD-10-CM | POA: Diagnosis not present

## 2024-07-13 DIAGNOSIS — I2510 Atherosclerotic heart disease of native coronary artery without angina pectoris: Secondary | ICD-10-CM

## 2024-07-13 DIAGNOSIS — E039 Hypothyroidism, unspecified: Secondary | ICD-10-CM

## 2024-07-13 DIAGNOSIS — F439 Reaction to severe stress, unspecified: Secondary | ICD-10-CM | POA: Diagnosis not present

## 2024-07-13 DIAGNOSIS — Z7985 Long-term (current) use of injectable non-insulin antidiabetic drugs: Secondary | ICD-10-CM

## 2024-07-13 DIAGNOSIS — E1165 Type 2 diabetes mellitus with hyperglycemia: Secondary | ICD-10-CM | POA: Diagnosis not present

## 2024-07-13 DIAGNOSIS — I7300 Raynaud's syndrome without gangrene: Secondary | ICD-10-CM | POA: Diagnosis not present

## 2024-07-13 DIAGNOSIS — E7800 Pure hypercholesterolemia, unspecified: Secondary | ICD-10-CM | POA: Diagnosis not present

## 2024-07-13 DIAGNOSIS — M810 Age-related osteoporosis without current pathological fracture: Secondary | ICD-10-CM

## 2024-07-13 DIAGNOSIS — Z7984 Long term (current) use of oral hypoglycemic drugs: Secondary | ICD-10-CM | POA: Diagnosis not present

## 2024-07-13 MED ORDER — SEMAGLUTIDE(0.25 OR 0.5MG/DOS) 2 MG/3ML ~~LOC~~ SOPN: 0.5000 mg | PEN_INJECTOR | SUBCUTANEOUS | 1 refills | Status: AC

## 2024-07-13 MED ORDER — METFORMIN HCL ER 500 MG PO TB24: ORAL_TABLET | ORAL | 1 refills | Status: AC

## 2024-07-13 MED ORDER — AMLODIPINE BESYLATE 2.5 MG PO TABS: 2.5000 mg | ORAL_TABLET | Freq: Every day | ORAL | 1 refills | Status: AC

## 2024-07-13 NOTE — Progress Notes (Signed)
 "  Subjective:    Patient ID: Donna Howell, female    DOB: 03/09/1951, 73 y.o.   MRN: [id number]  Patient here for  Chief Complaint  Patient presents with   Medical Management of Chronic Issues    Discuss meds    HPI Here for a scheduled follow up - follow up regarding diabetes, hypertension and hypercholesterolemia. Also increased stress. Recently had oral surgery 02/22/24 and multiple eye surgeries. Had f/u with cardiology 06/15/24 - stable. No changes made. Discussed recent labs - A1c 6.6. she wants her A1c lower. Request to increase ozempic  to .5mg .  No nausea. No heartburn. No increased abdominal pain. Previously discussed bone density results. Had wanted to hold on treatment until after oral surgeries. Bone density 04/2023 - osteoporosis.    Past Medical History:  Diagnosis Date   Allergy 1974   Anemia    Arthritis    Chicken pox    Clotting disorder 1962   Abnormal clotting time   Diabetes (HCC)    Family history of adverse reaction to anesthesia    daughter coded during surgery and son had trouble breathing during surgery   Frequent headaches    GERD (gastroesophageal reflux disease)    Grade I diastolic dysfunction    Hay fever    Heart murmur    High blood pressure    History of bronchitis    Hypothyroidism    IBS (irritable bowel syndrome)    Liver cyst    Mitral valve regurgitation    Moderate mitral regurgitation by prior echocardiogram    Moderate tricuspid regurgitation by prior echocardiogram    Multilevel degenerative disc disease    Raynaud's disease    Substance abuse (HCC)    Tricuspid regurgitation    Wears dentures    full lower   Past Surgical History:  Procedure Laterality Date   APPENDECTOMY  1974   BREAST BIOPSY Left 01/16/2021   Affirm bx-Ribbon clip- PREDOMINANTLY BENIGN ADIPOSE TISSUE. - AREAS OF STROMAL FIBROSIS AND MAMMARY GLANDS WITH ATROPHIC AND APOCRINE CHANGES. - NEGATIVE FOR ATYPIA AND MALIGNANCY.   CATARACT EXTRACTION W/PHACO  Left 09/02/2023   Procedure: CATARACT EXTRACTION PHACO AND INTRAOCULAR LENS PLACEMENT (IOC) LEFT DIABETIC MALYUGIN  CLAREON VIVITY TORIC 8.20 00:44.0;  Surgeon: Mittie Gaskin, MD;  Location: [facility];  Service: Ophthalmology;  Laterality: Left;   CATARACT EXTRACTION W/PHACO Right 09/28/2023   Procedure: CATARACT EXTRACTION PHACO AND INTRAOCULAR LENS PLACEMENT (IOC) RIGHT DIABETIC MALYUGIN  CLAREON VIVITY 6.17 00:41.9;  Surgeon: Mittie Gaskin, MD;  Location: [facility];  Service: Ophthalmology;  Laterality: Right;   CERVICAL CERCLAGE     DIAGNOSTIC LAPAROSCOPY     MULTIPLE TOOTH EXTRACTIONS  11/23/2023   REMOVE AND REPLACE LENS Right 12/02/2023   Procedure: REMOVAL OR REPLACEMENT, IOL 0.24 00:01.0;  Surgeon: Mittie Gaskin, MD;  Location: [facility];  Service: Ophthalmology;  Laterality: Right;   SHOULDER ARTHROSCOPY WITH SUBACROMIAL DECOMPRESSION, ROTATOR CUFF REPAIR AND BICEP TENDON REPAIR Right 12/24/2021   Procedure: RIGHT SHOULDER ARTHROSCOPY WITH DEBRIDEMENT, DECOMPRESSION, ROTATOR CUFF RELEASE, AND BICEPS TENODESIS;  Surgeon: Edie Norleen PARAS, MD;  Location: [facility];  Service: Orthopedics;  Laterality: Right;   TONSILLECTOMY AND ADENOIDECTOMY  1962   Family History  Problem Relation Age of Onset   Arthritis Mother    Heart disease Mother    Diabetes Mother    Hypertension Mother    Stroke Father    Heart disease Father    Arthritis Sister    Breast cancer Sister  55   Alcoholism Brother    Alcohol abuse Brother        Died at 21   Arthritis Maternal Grandmother    Heart disease Maternal Grandmother    Diabetes Maternal Grandmother    Hypertension Maternal Grandmother    Heart disease Maternal Grandfather    Social History   Socioeconomic History   Marital status: Married    Spouse name: Not on file   Number of children: Not on file   Years of education: Not on file   Highest education level: Not on file  Occupational History    Not on file  Tobacco Use   Smoking status: Never   Smokeless tobacco: Never  Vaping Use   Vaping status: Never Used  Substance and Sexual Activity   Alcohol use: No   Drug use: No   Sexual activity: Not on file  Other Topics Concern   Not on file  Social History Narrative   Not on file   Social Drivers of Health   Tobacco Use: Low Risk (07/22/2024)   Patient History    Smoking Tobacco Use: Never    Smokeless Tobacco Use: Never    Passive Exposure: Not on file  Financial Resource Strain: Low Risk  (06/15/2024)   Received from [facility]   Overall Financial Resource Strain (CARDIA)    Difficulty of Paying Living Expenses: Not hard at all  Food Insecurity: No Food Insecurity (06/15/2024)   Received from [facility]   Epic    Within the past 12 months, you worried that your food would run out before you got the money to buy more.: Never true    Within the past 12 months, the food you bought just didn't last and you didn't have money to get more.: Never true  Transportation Needs: No Transportation Needs (06/15/2024)   Received from [facility] - Transportation    In the past 12 months, has lack of transportation kept you from medical appointments or from getting medications?: No    Lack of Transportation (Non-Medical): No  Physical Activity: Sufficiently Active (03/10/2023)   Exercise Vital Sign    Days of Exercise per Week: 5 days    Minutes of Exercise per Session: 30 min  Stress: Stress Concern Present (03/10/2023)   [employer] - Occupational Stress Questionnaire    Feeling of Stress : To some extent  Social Connections: Moderately Integrated (03/10/2023)   Social Connection and Isolation Panel    Frequency of Communication with Friends and Family: More than three times a week    Frequency of Social Gatherings with Friends and Family: Twice a week    Attends Religious Services:  Never    Database Administrator or Organizations: Yes    Attends Engineer, Structural: More than 4 times per year    Marital Status: Married  Depression (PHQ2-9): Low Risk (02/02/2024)   Depression (PHQ2-9)    PHQ-2 Score: 3  Alcohol Screen: Low Risk (03/10/2023)   Alcohol Screen    Last Alcohol Screening Score (AUDIT): 0  Housing: Low Risk  (06/15/2024)   Received from [facility]   Epic    In the last 12 months, was there a time when you were not able to pay the mortgage or rent on time?: No    In the past 12 months, how many times have you moved where you were living?: 0  At any time in the past 12 months, were you homeless or living in a shelter (including now)?: No  Utilities: Not At Risk (06/15/2024)   Received from [facility]   Epic    In the past 12 months has the electric, gas, oil, or water company threatened to shut off services in your home?: No  Health Literacy: Not on file     Review of Systems  Constitutional:  Negative for appetite change and unexpected weight change.  HENT:  Negative for congestion and sinus pressure.   Respiratory:  Negative for cough, chest tightness and shortness of breath.   Cardiovascular:  Negative for chest pain, palpitations and leg swelling.  Gastrointestinal:  Negative for abdominal pain, diarrhea, nausea and vomiting.  Genitourinary:  Negative for difficulty urinating and dysuria.  Musculoskeletal:  Negative for joint swelling and myalgias.  Skin:  Negative for color change and rash.  Neurological:  Negative for dizziness.  Psychiatric/Behavioral:  Negative for agitation and dysphoric mood.        Objective:     BP 100/60   Pulse 61   Temp (!) 97.4 F (36.3 C) (Oral)   Ht 5' 4 (1.626 m)   Wt 112 lb 6.4 oz (51 kg)   SpO2 100%   BMI 19.29 kg/m  Wt Readings from Last 3 Encounters:  07/13/24 112 lb 6.4 oz (51 kg)  05/10/24 112 lb 9.6 oz (51.1 kg)  04/29/24 112 lb 12.8 oz (51.2  kg)    Physical Exam Vitals reviewed.  Constitutional:      General: She is not in acute distress.    Appearance: Normal appearance.  HENT:     Head: Normocephalic and atraumatic.     Right Ear: External ear normal.     Left Ear: External ear normal.     Mouth/Throat:     Pharynx: No oropharyngeal exudate or posterior oropharyngeal erythema.  Eyes:     General: No scleral icterus.       Right eye: No discharge.        Left eye: No discharge.     Conjunctiva/sclera: Conjunctivae normal.  Neck:     Thyroid : No thyromegaly.  Cardiovascular:     Rate and Rhythm: Normal rate and regular rhythm.  Pulmonary:     Effort: No respiratory distress.     Breath sounds: Normal breath sounds. No wheezing.  Abdominal:     General: Bowel sounds are normal.     Palpations: Abdomen is soft.     Tenderness: There is no abdominal tenderness.  Musculoskeletal:        General: No swelling or tenderness.     Cervical back: Neck supple. No tenderness.  Lymphadenopathy:     Cervical: No cervical adenopathy.  Skin:    Findings: No erythema or rash.  Neurological:     Mental Status: She is alert.  Psychiatric:        Mood and Affect: Mood normal.        Behavior: Behavior normal.         Outpatient Encounter Medications as of 07/13/2024  Medication Sig   aspirin 81 MG chewable tablet Chew 81 mg by mouth daily.   blood glucose meter kit and supplies KIT Dispense based on patient and insurance preference. Use up to four times daily as directed.   Calcium -Vitamin D-Vitamin K (CALCIUM  + D + K PO) Take by mouth daily.   Cholecalciferol (VITAMIN D3) 25 MCG (1000 UT) CHEW Chew 2,000 Units  by mouth in the morning and at bedtime.   Coenzyme Q10 (CO Q 10 PO) Take 1 tablet by mouth daily at 6 (six) AM.   Cranberry-Vitamin C-Probiotic (AZO CRANBERRY PO) Take by mouth in the morning and at bedtime.   Cyanocobalamin (VITAMIN B12 PO) Take 2 tablets by mouth daily at 6 (six) AM. gummies   FREESTYLE LITE  test strip USE TO CHECK BLOOD SUGARS THREE TIMES A DAY   losartan  (COZAAR ) 25 MG tablet Take 1 tablet (25 mg total) by mouth daily.   MAGNESIUM PO Take 1 tablet by mouth daily at 6 (six) AM.   metoprolol  succinate (TOPROL -XL) 25 MG 24 hr tablet Take 1 tablet (25 mg total) by mouth 2 (two) times daily.   Multiple Vitamin (MULTIVITAMIN) tablet Take 1 tablet by mouth daily.   polycarbophil (FIBERCON) 625 MG tablet Take 625 mg by mouth daily.   Probiotic Product (PROBIOTIC DAILY PO) Take 1 tablet by mouth daily at 6 (six) AM.   rosuvastatin  (CRESTOR ) 20 MG tablet Take 1 tablet (20 mg total) by mouth daily.   [DISCONTINUED] levothyroxine  (SYNTHROID ) 50 MCG tablet Take 1 tablet (50 mcg total) by mouth daily.   [DISCONTINUED] Semaglutide ,0.25 or 0.5MG /DOS, 2 MG/3ML SOPN Inject 0.25 mg into the skin once a week.   amLODipine  (NORVASC ) 2.5 MG tablet Take 1 tablet (2.5 mg total) by mouth daily.   Continuous Glucose Sensor (FREESTYLE LIBRE 3 PLUS SENSOR) MISC Change sensor every 15 days. (Patient not taking: Reported on 07/13/2024)   eszopiclone  (LUNESTA ) 2 MG TABS tablet Take 1 tablet (2 mg total) by mouth at bedtime as needed for sleep. Take immediately before bedtimeTAKE ONE TABLET BY MOUTH ONE TIME DAILY AT BEDTIME AS NEEDED FOR SLEEP   levothyroxine  (SYNTHROID ) 50 MCG tablet Take 1 tablet (50 mcg total) by mouth daily.   metFORMIN  (GLUCOPHAGE -XR) 500 MG 24 hr tablet Takes 2 tablets AM, 2 tablets PM   Semaglutide ,0.25 or 0.5MG /DOS, 2 MG/3ML SOPN Inject 0.5 mg into the skin once a week.   [DISCONTINUED] amLODipine  (NORVASC ) 2.5 MG tablet TAKE ONE TABLET BY MOUTH ONE TIME DAILY   [DISCONTINUED] carboxymethylcellul-glycerin (REFRESH OPTIVE) 0.5-0.9 % ophthalmic solution Place 1 drop into both eyes.   [DISCONTINUED] Difluprednate 0.05 % EMUL  (Patient not taking: Reported on 04/29/2024)   [DISCONTINUED] eszopiclone  (LUNESTA ) 2 MG TABS tablet TAKE ONE TABLET BY MOUTH ONE TIME DAILY AT BEDTIME AS NEEDED FOR  SLEEP   [DISCONTINUED] metFORMIN  (GLUCOPHAGE -XR) 500 MG 24 hr tablet Takes 2 tablets AM, 2 tablets PM   [DISCONTINUED] MURO 128 5 % ophthalmic ointment APPLY ONE FOURTH (1/4) INCH INTO THE RIGHT EYE TWO TIMES A DAY   [DISCONTINUED] ofloxacin (OCUFLOX) 0.3 % ophthalmic solution    No facility-administered encounter medications on file as of 07/13/2024.     Lab Results  Component Value Date   WBC 5.3 05/03/2024   HGB 12.5 05/03/2024   HCT 36.7 05/03/2024   PLT 288.0 05/03/2024   GLUCOSE 104 (H) 05/03/2024   CHOL 128 05/03/2024   TRIG 64.0 05/03/2024   HDL 79.00 05/03/2024   LDLCALC 36 05/03/2024   ALT 16 05/03/2024   AST 24 05/03/2024   NA 134 (L) 05/03/2024   K 4.4 05/03/2024   CL 97 05/03/2024   CREATININE 0.75 05/03/2024   BUN 10 05/03/2024   CO2 31 05/03/2024   TSH 1.50 11/27/2023   HGBA1C 6.6 (H) 05/03/2024   MICROALBUR <0.7 10/26/2023    CT CARDIAC SCORING (SELF PAY ONLY) Addendum Date:  01/22/2024 ADDENDUM REPORT: 01/22/2024 20:48 EXAM: OVER-READ INTERPRETATION  CT CHEST The following report is an over-read performed by radiologist Dr. Andrea Gasman of [facility] on 01/22/2024. This over-read does not include interpretation of cardiac or coronary anatomy or pathology. The coronary calcium  score interpretation by the cardiologist is attached. COMPARISON:  None. FINDINGS: Vascular: Aortic atherosclerosis. The included aorta is normal in caliber. Mediastinum/nodes: No adenopathy or mass. Unremarkable esophagus. Lungs: No focal airspace disease. No pulmonary nodule. No pleural fluid. The included airways are patent. Upper abdomen: No acute or unexpected findings. Musculoskeletal: There are no acute or suspicious osseous abnormalities. IMPRESSION: Aortic Atherosclerosis (ICD10-I70.0). Electronically Signed   By: Andrea Gasman M.D.   On: 01/22/2024 20:48   Result Date: 01/22/2024 CLINICAL DATA:  Risk stratification EXAM: Coronary Calcium  Score TECHNIQUE: The patient  was scanned on a Siemens Somatom scanner. Axial non-contrast 3 mm slices were carried out through the heart. The data set was analyzed on a dedicated work station and scored using the Agatson method. FINDINGS: Non-cardiac: See separate report from [facility]. Ascending Aorta: Normal size Pericardium: Normal Coronary arteries: Normal origin of left and right coronary arteries. Distribution of arterial calcifications if present, as noted below; LM 0 LAD 19 LCx 0 RCA 0 Total 19 IMPRESSION AND RECOMMENDATION: 1. Coronary calcium  score of 19. This was 47th percentile for age and sex matched control. 2. CAC 1-99 in LAD. CAC-DRS A1/N1. 3. Continue heart healthy lifestyle and risk factor modification. Electronically Signed: By: Redell Cave M.D. On: 01/11/2024 10:09       Assessment & Plan:  Type 2 diabetes mellitus with hyperglycemia, without long-term current use of insulin (HCC) Assessment & Plan: Discussed her sugars, medication regimen and recent procedures.   A1c 6.6. discussed importance of eating regular meals. She requested to add SG:LT2 inhibitor or increased GLP 1. Had previously discussed holding given her current control of her sugars. Also, hold given her problems with diet. Follow sugars. Follow metabolic panel.  She is requesting to increase ozempic  to .5mg  weekly. Follow po intake. Follow weight.   Orders: -     Hemoglobin A1c; Future  Hypercholesterolemia Assessment & Plan: Continue crestor .  Follow lipid panel and liver function tests. No change in medication today.  Lab Results  Component Value Date   CHOL 128 05/03/2024   HDL 79.00 05/03/2024   LDLCALC 36 05/03/2024   TRIG 64.0 05/03/2024   CHOLHDL 2 05/03/2024     Orders: -     Lipid panel; Future -     Hepatic function panel; Future -     Basic metabolic panel with GFR; Future  Stress Assessment & Plan: Increased stress. Does not feel needs any further intervention. Follow.    Raynaud's disease without  gangrene Assessment & Plan: Stable on amlodipine .    Osteoporosis without current pathological fracture, unspecified osteoporosis type Assessment & Plan:  Saw endocrinology 09/23/23 - recommended reclast. Was put on hold due to dental procedures. Completed her oral surgeries. Request referral to endocrinology.   Orders: -     Ambulatory referral to Endocrinology  Hypothyroidism, unspecified type Assessment & Plan: On thyroid  replacement. Follow tsh.    Hypertension, essential Assessment & Plan: On losartan  and amlodipine  2.5mg  q day. Follow pressures. Follow metabolic panel.    Coronary artery disease involving native coronary artery of native heart without angina pectoris Assessment & Plan: Had f/u with cardiology 04/29/24. ECHO 07/2023 - EF 60-65%, G!DD, mild to moderate MR, moderate TR. Continue losartan  and  toprol . Recommended f/u echo next visit. Also recommended to continue crestor  and aspirin. Had questions about her EKG performed through cardiology. Concerned regarding the computer's interpretation (specifically, she had read where the computer states septal infarct - age undetermined). Discussed above with her and discussed cardiology felt stable. No chest pain or sob reported. Continue risk factor modification. Follow.    Other orders -     amLODIPine  Besylate; Take 1 tablet (2.5 mg total) by mouth daily.  Dispense: 90 tablet; Refill: 1 -     Eszopiclone ; Take 1 tablet (2 mg total) by mouth at bedtime as needed for sleep. Take immediately before bedtimeTAKE ONE TABLET BY MOUTH ONE TIME DAILY AT BEDTIME AS NEEDED FOR SLEEP  Dispense: 30 tablet; Refill: 2 -     metFORMIN  HCl ER; Takes 2 tablets AM, 2 tablets PM  Dispense: 360 tablet; Refill: 1 -     Semaglutide (0.25 or 0.5MG /DOS); Inject 0.5 mg into the skin once a week.  Dispense: 6 mL; Refill: 1 -     Levothyroxine  Sodium; Take 1 tablet (50 mcg total) by mouth daily.  Dispense: 90 tablet; Refill: 1     Allena Hamilton, MD "

## 2024-07-22 ENCOUNTER — Other Ambulatory Visit: Payer: Self-pay | Admitting: Internal Medicine

## 2024-07-22 ENCOUNTER — Encounter: Payer: Self-pay | Admitting: Internal Medicine

## 2024-07-22 MED ORDER — LEVOTHYROXINE SODIUM 50 MCG PO TABS: 50.0000 ug | ORAL_TABLET | Freq: Every day | ORAL | 3 refills | Status: AC

## 2024-07-22 MED ORDER — LEVOTHYROXINE SODIUM 50 MCG PO TABS: 50.0000 ug | ORAL_TABLET | Freq: Every day | ORAL | 1 refills | Status: AC

## 2024-07-22 MED ORDER — ESZOPICLONE 2 MG PO TABS: 2.0000 mg | ORAL_TABLET | Freq: Every evening | ORAL | 2 refills | Status: DC | PRN

## 2024-07-22 NOTE — Telephone Encounter (Signed)
 Copied from CRM #[id number]. Topic: Clinical - Medication Refill >> Jul 22, 2024 12:25 PM Nessti S wrote: Medication: levothyroxine  (SYNTHROID ) 50 MCG tablet   Has the patient contacted their pharmacy? Yes (Agent: If no, request that the patient contact the pharmacy for the refill. If patient does not wish to contact the pharmacy document the reason why and proceed with request.) (Agent: If yes, when and what did the pharmacy advise?)  This is the patient's preferred pharmacy:  [facility] [address] - [place], [place] - [address] [place] [address] Phone: [phone] Fax: [phone]  Is this the correct pharmacy for this prescription? Yes If no, delete pharmacy and type the correct one.   Has the prescription been filled recently? No  Is the patient out of the medication? No  Has the patient been seen for an appointment in the last year OR does the patient have an upcoming appointment? Yes  Can we respond through MyChart? Yes  Agent: Please be advised that Rx refills may take up to 3 business days. We ask that you follow-up with your pharmacy.

## 2024-07-22 NOTE — Assessment & Plan Note (Signed)
"   Saw endocrinology 09/23/23 - recommended reclast. Was put on hold due to dental procedures. Completed her oral surgeries. Request referral to endocrinology.  "

## 2024-07-22 NOTE — Assessment & Plan Note (Signed)
 Discussed her sugars, medication regimen and recent procedures.   A1c 6.6. discussed importance of eating regular meals. She requested to add SG:LT2 inhibitor or increased GLP 1. Had previously discussed holding given her current control of her sugars. Also, hold given her problems with diet. Follow sugars. Follow metabolic panel.  She is requesting to increase ozempic  to .5mg  weekly. Follow po intake. Follow weight.

## 2024-07-22 NOTE — Assessment & Plan Note (Signed)
 Had f/u with cardiology 04/29/24. ECHO 07/2023 - EF 60-65%, G!DD, mild to moderate MR, moderate TR. Continue losartan  and toprol . Recommended f/u echo next visit. Also recommended to continue crestor  and aspirin. Had questions about her EKG performed through cardiology. Concerned regarding the computer's interpretation (specifically, she had read where the computer states septal infarct - age undetermined). Discussed above with her and discussed cardiology felt stable. No chest pain or sob reported. Continue risk factor modification. Follow.

## 2024-07-22 NOTE — Assessment & Plan Note (Signed)
 On thyroid replacement.  Follow tsh.

## 2024-07-22 NOTE — Assessment & Plan Note (Signed)
 On losartan  and amlodipine  2.5mg  q day. Follow pressures. Follow metabolic panel.

## 2024-07-22 NOTE — Assessment & Plan Note (Signed)
 Continue crestor .  Follow lipid panel and liver function tests. No change in medication today.  Lab Results  Component Value Date   CHOL 128 05/03/2024   HDL 79.00 05/03/2024   LDLCALC 36 05/03/2024   TRIG 64.0 05/03/2024   CHOLHDL 2 05/03/2024

## 2024-07-22 NOTE — Assessment & Plan Note (Signed)
 Stable on amlodipine

## 2024-07-22 NOTE — Assessment & Plan Note (Signed)
Increased stress.  Does not feel needs any further intervention.  Follow.   

## 2024-08-05 ENCOUNTER — Other Ambulatory Visit: Payer: Self-pay | Admitting: Internal Medicine

## 2024-08-05 DIAGNOSIS — Z1231 Encounter for screening mammogram for malignant neoplasm of breast: Secondary | ICD-10-CM

## 2024-08-16 ENCOUNTER — Other Ambulatory Visit: Payer: Self-pay | Admitting: Internal Medicine

## 2024-08-26 ENCOUNTER — Other Ambulatory Visit: Payer: Self-pay

## 2024-08-26 ENCOUNTER — Encounter

## 2024-08-26 ENCOUNTER — Ambulatory Visit
Admission: RE | Admit: 2024-08-26 | Discharge: 2024-08-26 | Disposition: A | Source: Ambulatory Visit | Attending: Internal Medicine | Admitting: Internal Medicine

## 2024-08-26 DIAGNOSIS — Z1231 Encounter for screening mammogram for malignant neoplasm of breast: Secondary | ICD-10-CM | POA: Insufficient documentation

## 2024-08-26 DIAGNOSIS — M810 Age-related osteoporosis without current pathological fracture: Secondary | ICD-10-CM

## 2024-08-26 NOTE — Progress Notes (Unsigned)
 Received order request from [facility]. Dr. Lenda placed orders for bmp and vit d.

## 2024-09-12 ENCOUNTER — Other Ambulatory Visit

## 2024-09-14 ENCOUNTER — Ambulatory Visit: Admitting: Internal Medicine
